# Patient Record
Sex: Male | Born: 1950 | Race: White | Hispanic: No | Marital: Married | Smoking: Former smoker
Health system: Southern US, Community
[De-identification: ages and names within clinical notes are randomized; demographics above are authoritative.]

## PROBLEM LIST (undated history)

## (undated) DIAGNOSIS — G4733 Obstructive sleep apnea (adult) (pediatric): Secondary | ICD-10-CM

## (undated) DIAGNOSIS — I219 Acute myocardial infarction, unspecified: Secondary | ICD-10-CM

## (undated) DIAGNOSIS — Z9989 Dependence on other enabling machines and devices: Secondary | ICD-10-CM

## (undated) DIAGNOSIS — M75101 Unspecified rotator cuff tear or rupture of right shoulder, not specified as traumatic: Secondary | ICD-10-CM

## (undated) DIAGNOSIS — E663 Overweight: Secondary | ICD-10-CM

## (undated) DIAGNOSIS — J42 Unspecified chronic bronchitis: Secondary | ICD-10-CM

## (undated) DIAGNOSIS — E785 Hyperlipidemia, unspecified: Secondary | ICD-10-CM

## (undated) DIAGNOSIS — M1990 Unspecified osteoarthritis, unspecified site: Secondary | ICD-10-CM

## (undated) DIAGNOSIS — M797 Fibromyalgia: Secondary | ICD-10-CM

## (undated) DIAGNOSIS — R9430 Abnormal result of cardiovascular function study, unspecified: Secondary | ICD-10-CM

## (undated) DIAGNOSIS — Z6833 Body mass index (BMI) 33.0-33.9, adult: Secondary | ICD-10-CM

## (undated) DIAGNOSIS — E039 Hypothyroidism, unspecified: Secondary | ICD-10-CM

## (undated) DIAGNOSIS — I2510 Atherosclerotic heart disease of native coronary artery without angina pectoris: Secondary | ICD-10-CM

## (undated) DIAGNOSIS — R7989 Other specified abnormal findings of blood chemistry: Secondary | ICD-10-CM

## (undated) DIAGNOSIS — J309 Allergic rhinitis, unspecified: Secondary | ICD-10-CM

## (undated) DIAGNOSIS — I10 Essential (primary) hypertension: Secondary | ICD-10-CM

## (undated) DIAGNOSIS — J45909 Unspecified asthma, uncomplicated: Secondary | ICD-10-CM

## (undated) DIAGNOSIS — K219 Gastro-esophageal reflux disease without esophagitis: Secondary | ICD-10-CM

## (undated) DIAGNOSIS — I639 Cerebral infarction, unspecified: Secondary | ICD-10-CM

## (undated) DIAGNOSIS — M109 Gout, unspecified: Secondary | ICD-10-CM

## (undated) DIAGNOSIS — E119 Type 2 diabetes mellitus without complications: Secondary | ICD-10-CM

## (undated) DIAGNOSIS — J449 Chronic obstructive pulmonary disease, unspecified: Secondary | ICD-10-CM

## (undated) HISTORY — DX: Atherosclerotic heart disease of native coronary artery without angina pectoris: I25.10

## (undated) HISTORY — PX: CORONARY ANGIOPLASTY WITH STENT PLACEMENT: SHX49

## (undated) HISTORY — PX: CARDIAC CATHETERIZATION: SHX172

## (undated) HISTORY — DX: Allergic rhinitis, unspecified: J30.9

## (undated) HISTORY — DX: Essential (primary) hypertension: I10

## (undated) HISTORY — DX: Hyperlipidemia, unspecified: E78.5

## (undated) HISTORY — PX: SHOULDER OPEN ROTATOR CUFF REPAIR: SHX2407

## (undated) HISTORY — DX: Cerebral infarction, unspecified: I63.9

## (undated) HISTORY — DX: Unspecified osteoarthritis, unspecified site: M19.90

## (undated) HISTORY — DX: Body mass index (BMI) 33.0-33.9, adult: Z68.33

## (undated) HISTORY — DX: Fibromyalgia: M79.7

## (undated) HISTORY — DX: Overweight: E66.3

## (undated) HISTORY — DX: Other specified abnormal findings of blood chemistry: R79.89

## (undated) HISTORY — DX: Hypothyroidism, unspecified: E03.9

## (undated) HISTORY — DX: Unspecified rotator cuff tear or rupture of right shoulder, not specified as traumatic: M75.101

## (undated) HISTORY — PX: OTHER SURGICAL HISTORY: SHX169

## (undated) HISTORY — DX: Abnormal result of cardiovascular function study, unspecified: R94.30

---

## 2004-02-27 DIAGNOSIS — I219 Acute myocardial infarction, unspecified: Secondary | ICD-10-CM

## 2004-02-27 HISTORY — DX: Acute myocardial infarction, unspecified: I21.9

## 2004-05-22 ENCOUNTER — Ambulatory Visit: Payer: Self-pay | Admitting: Pulmonary Disease

## 2004-08-23 ENCOUNTER — Ambulatory Visit: Payer: Self-pay | Admitting: Cardiology

## 2004-08-23 ENCOUNTER — Inpatient Hospital Stay (HOSPITAL_COMMUNITY): Admission: EM | Admit: 2004-08-23 | Discharge: 2004-08-26 | Payer: Self-pay | Admitting: Emergency Medicine

## 2004-08-25 ENCOUNTER — Ambulatory Visit: Payer: Self-pay | Admitting: Pulmonary Disease

## 2004-09-06 ENCOUNTER — Ambulatory Visit: Payer: Self-pay | Admitting: Pulmonary Disease

## 2004-09-25 ENCOUNTER — Ambulatory Visit: Payer: Self-pay | Admitting: Cardiology

## 2004-09-27 ENCOUNTER — Ambulatory Visit: Payer: Self-pay | Admitting: Cardiology

## 2004-09-27 ENCOUNTER — Ambulatory Visit (HOSPITAL_COMMUNITY): Admission: RE | Admit: 2004-09-27 | Discharge: 2004-09-28 | Payer: Self-pay | Admitting: Cardiology

## 2004-10-05 ENCOUNTER — Encounter (HOSPITAL_COMMUNITY): Admission: RE | Admit: 2004-10-05 | Discharge: 2005-01-03 | Payer: Self-pay | Admitting: Cardiology

## 2004-10-17 ENCOUNTER — Ambulatory Visit: Payer: Self-pay | Admitting: Cardiology

## 2004-10-26 ENCOUNTER — Ambulatory Visit: Payer: Self-pay | Admitting: Cardiology

## 2004-12-01 ENCOUNTER — Ambulatory Visit: Payer: Self-pay | Admitting: Pulmonary Disease

## 2004-12-14 ENCOUNTER — Ambulatory Visit: Payer: Self-pay | Admitting: Cardiology

## 2005-03-22 ENCOUNTER — Ambulatory Visit: Payer: Self-pay | Admitting: Cardiology

## 2005-04-12 ENCOUNTER — Ambulatory Visit: Payer: Self-pay | Admitting: Cardiology

## 2005-05-07 ENCOUNTER — Ambulatory Visit: Payer: Self-pay

## 2005-05-10 ENCOUNTER — Ambulatory Visit: Payer: Self-pay | Admitting: Cardiology

## 2005-05-16 ENCOUNTER — Ambulatory Visit: Payer: Self-pay | Admitting: Pulmonary Disease

## 2005-07-09 ENCOUNTER — Ambulatory Visit: Payer: Self-pay | Admitting: Pulmonary Disease

## 2005-07-12 ENCOUNTER — Ambulatory Visit: Payer: Self-pay | Admitting: Cardiology

## 2005-08-20 ENCOUNTER — Ambulatory Visit: Payer: Self-pay | Admitting: Pulmonary Disease

## 2005-09-03 ENCOUNTER — Ambulatory Visit: Payer: Self-pay | Admitting: Cardiology

## 2005-09-03 ENCOUNTER — Ambulatory Visit: Payer: Self-pay | Admitting: Internal Medicine

## 2005-09-06 ENCOUNTER — Ambulatory Visit: Payer: Self-pay | Admitting: Cardiology

## 2005-09-11 ENCOUNTER — Ambulatory Visit: Payer: Self-pay | Admitting: Cardiology

## 2005-09-11 ENCOUNTER — Inpatient Hospital Stay (HOSPITAL_BASED_OUTPATIENT_CLINIC_OR_DEPARTMENT_OTHER): Admission: RE | Admit: 2005-09-11 | Discharge: 2005-09-11 | Payer: Self-pay | Admitting: Cardiology

## 2005-09-18 ENCOUNTER — Ambulatory Visit: Payer: Self-pay | Admitting: Pulmonary Disease

## 2005-10-01 ENCOUNTER — Ambulatory Visit: Payer: Self-pay | Admitting: Internal Medicine

## 2006-03-19 ENCOUNTER — Ambulatory Visit: Payer: Self-pay | Admitting: Pulmonary Disease

## 2006-03-19 LAB — CONVERTED CEMR LAB
Albumin: 4.4 g/dL (ref 3.5–5.2)
Alkaline Phosphatase: 22 units/L — ABNORMAL LOW (ref 39–117)
BUN: 18 mg/dL (ref 6–23)
Basophils Absolute: 0 10*3/uL (ref 0.0–0.1)
Basophils Relative: 0.6 % (ref 0.0–1.0)
CO2: 31 meq/L (ref 19–32)
Creatinine, Ser: 1.2 mg/dL (ref 0.4–1.5)
Eosinophils Relative: 6 % — ABNORMAL HIGH (ref 0.0–5.0)
GFR calc non Af Amer: 67 mL/min
Glucose, Bld: 109 mg/dL — ABNORMAL HIGH (ref 70–99)
Hemoglobin: 14.6 g/dL (ref 13.0–17.0)
MCV: 88.6 fL (ref 78.0–100.0)
Monocytes Relative: 9.8 % (ref 3.0–11.0)
Neutro Abs: 3.2 10*3/uL (ref 1.4–7.7)
Potassium: 4.7 meq/L (ref 3.5–5.1)
RBC: 4.73 M/uL (ref 4.22–5.81)
Sodium: 141 meq/L (ref 135–145)
Total CHOL/HDL Ratio: 4.6

## 2006-04-18 ENCOUNTER — Ambulatory Visit: Payer: Self-pay | Admitting: Cardiology

## 2006-07-24 ENCOUNTER — Ambulatory Visit: Payer: Self-pay | Admitting: Pulmonary Disease

## 2006-08-15 ENCOUNTER — Ambulatory Visit: Payer: Self-pay | Admitting: Cardiology

## 2006-09-18 ENCOUNTER — Other Ambulatory Visit: Payer: Self-pay | Admitting: Orthopedic Surgery

## 2006-09-19 ENCOUNTER — Observation Stay (HOSPITAL_COMMUNITY): Admission: RE | Admit: 2006-09-19 | Discharge: 2006-09-20 | Payer: Self-pay | Admitting: Orthopedic Surgery

## 2007-03-28 DIAGNOSIS — E785 Hyperlipidemia, unspecified: Secondary | ICD-10-CM

## 2007-03-28 DIAGNOSIS — J209 Acute bronchitis, unspecified: Secondary | ICD-10-CM | POA: Insufficient documentation

## 2007-03-28 DIAGNOSIS — I10 Essential (primary) hypertension: Secondary | ICD-10-CM | POA: Insufficient documentation

## 2007-03-28 DIAGNOSIS — E039 Hypothyroidism, unspecified: Secondary | ICD-10-CM | POA: Insufficient documentation

## 2007-04-15 ENCOUNTER — Ambulatory Visit: Payer: Self-pay | Admitting: Cardiology

## 2007-04-15 ENCOUNTER — Ambulatory Visit: Payer: Self-pay | Admitting: Pulmonary Disease

## 2007-04-15 ENCOUNTER — Ambulatory Visit (HOSPITAL_BASED_OUTPATIENT_CLINIC_OR_DEPARTMENT_OTHER): Admission: RE | Admit: 2007-04-15 | Discharge: 2007-04-15 | Payer: Self-pay | Admitting: Pulmonary Disease

## 2007-04-15 DIAGNOSIS — G4733 Obstructive sleep apnea (adult) (pediatric): Secondary | ICD-10-CM

## 2007-04-21 ENCOUNTER — Telehealth: Payer: Self-pay | Admitting: Pulmonary Disease

## 2007-04-24 ENCOUNTER — Ambulatory Visit: Payer: Self-pay | Admitting: Pulmonary Disease

## 2007-05-21 ENCOUNTER — Encounter: Payer: Self-pay | Admitting: Pulmonary Disease

## 2007-05-22 ENCOUNTER — Ambulatory Visit: Payer: Self-pay | Admitting: Pulmonary Disease

## 2007-05-26 ENCOUNTER — Encounter: Payer: Self-pay | Admitting: Pulmonary Disease

## 2007-06-02 ENCOUNTER — Encounter: Payer: Self-pay | Admitting: Pulmonary Disease

## 2007-06-26 ENCOUNTER — Ambulatory Visit: Payer: Self-pay | Admitting: Pulmonary Disease

## 2007-06-26 DIAGNOSIS — J309 Allergic rhinitis, unspecified: Secondary | ICD-10-CM | POA: Insufficient documentation

## 2007-08-01 ENCOUNTER — Telehealth: Payer: Self-pay | Admitting: Pulmonary Disease

## 2007-08-06 ENCOUNTER — Encounter: Payer: Self-pay | Admitting: Pulmonary Disease

## 2007-10-08 ENCOUNTER — Ambulatory Visit: Payer: Self-pay | Admitting: Cardiology

## 2007-12-22 ENCOUNTER — Ambulatory Visit: Payer: Self-pay | Admitting: Pulmonary Disease

## 2008-03-05 ENCOUNTER — Encounter: Payer: Self-pay | Admitting: Adult Health

## 2008-03-05 ENCOUNTER — Ambulatory Visit: Payer: Self-pay | Admitting: Internal Medicine

## 2008-03-09 ENCOUNTER — Telehealth (INDEPENDENT_AMBULATORY_CARE_PROVIDER_SITE_OTHER): Payer: Self-pay | Admitting: *Deleted

## 2008-03-16 ENCOUNTER — Ambulatory Visit: Payer: Self-pay | Admitting: Cardiology

## 2008-03-24 ENCOUNTER — Encounter: Payer: Self-pay | Admitting: Pulmonary Disease

## 2008-03-24 ENCOUNTER — Ambulatory Visit: Payer: Self-pay

## 2008-04-01 ENCOUNTER — Ambulatory Visit: Payer: Self-pay | Admitting: Cardiology

## 2008-05-24 DIAGNOSIS — E663 Overweight: Secondary | ICD-10-CM | POA: Insufficient documentation

## 2008-07-23 ENCOUNTER — Telehealth (INDEPENDENT_AMBULATORY_CARE_PROVIDER_SITE_OTHER): Payer: Self-pay | Admitting: *Deleted

## 2008-08-03 ENCOUNTER — Ambulatory Visit: Payer: Self-pay | Admitting: Pulmonary Disease

## 2008-08-03 DIAGNOSIS — M1990 Unspecified osteoarthritis, unspecified site: Secondary | ICD-10-CM | POA: Insufficient documentation

## 2008-08-06 ENCOUNTER — Ambulatory Visit: Payer: Self-pay | Admitting: Pulmonary Disease

## 2008-08-08 LAB — CONVERTED CEMR LAB
AST: 23 units/L (ref 0–37)
Alkaline Phosphatase: 27 units/L — ABNORMAL LOW (ref 39–117)
BUN: 21 mg/dL (ref 6–23)
Bilirubin Urine: NEGATIVE
CO2: 29 meq/L (ref 19–32)
Calcium: 9 mg/dL (ref 8.4–10.5)
Chloride: 104 meq/L (ref 96–112)
Cholesterol: 241 mg/dL — ABNORMAL HIGH (ref 0–200)
Direct LDL: 178.6 mg/dL
Eosinophils Relative: 5.5 % — ABNORMAL HIGH (ref 0.0–5.0)
HCT: 41.4 % (ref 39.0–52.0)
Hemoglobin, Urine: NEGATIVE
Hemoglobin: 14.3 g/dL (ref 13.0–17.0)
Ketones, ur: NEGATIVE mg/dL
MCV: 89.9 fL (ref 78.0–100.0)
Monocytes Absolute: 0.6 10*3/uL (ref 0.1–1.0)
Monocytes Relative: 9.1 % (ref 3.0–12.0)
RBC: 4.61 M/uL (ref 4.22–5.81)
RDW: 12.8 % (ref 11.5–14.6)
Specific Gravity, Urine: 1.02 (ref 1.000–1.030)
Total Bilirubin: 0.8 mg/dL (ref 0.3–1.2)
Total Protein: 7.1 g/dL (ref 6.0–8.3)
Triglycerides: 143 mg/dL (ref 0.0–149.0)
Urine Glucose: NEGATIVE mg/dL
Urobilinogen, UA: 0.2 (ref 0.0–1.0)
WBC: 6.8 10*3/uL (ref 4.5–10.5)
pH: 6 (ref 5.0–8.0)

## 2008-12-20 ENCOUNTER — Ambulatory Visit: Payer: Self-pay | Admitting: Pulmonary Disease

## 2009-02-24 ENCOUNTER — Encounter (INDEPENDENT_AMBULATORY_CARE_PROVIDER_SITE_OTHER): Payer: Self-pay | Admitting: *Deleted

## 2009-04-03 ENCOUNTER — Encounter: Payer: Self-pay | Admitting: Cardiology

## 2009-04-04 ENCOUNTER — Ambulatory Visit: Payer: Self-pay | Admitting: Cardiology

## 2009-08-03 ENCOUNTER — Ambulatory Visit: Payer: Self-pay | Admitting: Pulmonary Disease

## 2009-08-07 DIAGNOSIS — R7989 Other specified abnormal findings of blood chemistry: Secondary | ICD-10-CM | POA: Insufficient documentation

## 2009-08-09 LAB — CONVERTED CEMR LAB
AST: 21 units/L (ref 0–37)
Alkaline Phosphatase: 33 units/L — ABNORMAL LOW (ref 39–117)
Calcium: 9.3 mg/dL (ref 8.4–10.5)
Eosinophils Relative: 7.2 % — ABNORMAL HIGH (ref 0.0–5.0)
GFR calc non Af Amer: 70.03 mL/min (ref >60)
HDL: 48 mg/dL (ref >39.00)
Hemoglobin: 13.6 g/dL (ref 13.0–17.0)
Lymphocytes Relative: 28.6 % (ref 12.0–46.0)
Monocytes Absolute: 0.8 10*3/uL (ref 0.1–1.0)
Monocytes Relative: 9.6 % (ref 3.0–12.0)
PSA: 0.34 ng/mL (ref 0.10–4.00)
Potassium: 4.7 meq/L (ref 3.5–5.1)
RDW: 13.4 % (ref 11.5–14.6)
Sodium: 141 meq/L (ref 135–145)
TSH: 0.44 microintl units/mL (ref 0.35–5.50)
Total Bilirubin: 0.7 mg/dL (ref 0.3–1.2)
Total Protein: 7.2 g/dL (ref 6.0–8.3)
VLDL: 40.2 mg/dL — ABNORMAL HIGH (ref 0.0–40.0)

## 2009-12-20 ENCOUNTER — Telehealth (INDEPENDENT_AMBULATORY_CARE_PROVIDER_SITE_OTHER): Payer: Self-pay | Admitting: *Deleted

## 2009-12-30 ENCOUNTER — Ambulatory Visit: Payer: Self-pay | Admitting: Pulmonary Disease

## 2010-03-26 LAB — CONVERTED CEMR LAB
Albumin: 4.5 g/dL (ref 3.5–5.2)
Basophils Relative: 1 % (ref 0.0–1.0)
Bilirubin, Direct: 0.1 mg/dL (ref 0.0–0.3)
CO2: 30 meq/L (ref 19–32)
Calcium: 9.4 mg/dL (ref 8.4–10.5)
Chloride: 102 meq/L (ref 96–112)
Cholesterol: 238 mg/dL (ref 0–200)
Eosinophils Absolute: 0.5 10*3/uL (ref 0.0–0.6)
Eosinophils Relative: 5.7 % — ABNORMAL HIGH (ref 0.0–5.0)
Glucose, Bld: 113 mg/dL — ABNORMAL HIGH (ref 70–99)
HDL: 47.9 mg/dL (ref >39.0)
Hemoglobin: 14.3 g/dL (ref 13.0–17.0)
Lymphocytes Relative: 29.8 % (ref 12.0–46.0)
Monocytes Absolute: 0.7 10*3/uL (ref 0.2–0.7)
PSA: 0.51 ng/mL (ref 0.10–4.00)
Potassium: 4.3 meq/L (ref 3.5–5.1)
RBC: 4.87 M/uL (ref 4.22–5.81)
Sodium: 140 meq/L (ref 135–145)
TSH: 0.39 microintl units/mL (ref 0.35–5.50)
Total Bilirubin: 0.9 mg/dL (ref 0.3–1.2)

## 2010-03-29 NOTE — Miscellaneous (Signed)
  Clinical Lists Changes  Problems: Added new problem of HYPERTENSION (ICD-401.9) Observations: Added new observation of PAST MED HX: ALLERGIC RHINITIS (ICD-477.9) OBSTRUCTIVE SLEEP APNEA (ICD-327.23)...C Pap....Dr. Kriste Basque ASTHMATIC BRONCHITIS, ACUTE (ICD-466.0) HYPERTENSION (ICD-401.9) ARTERIOSCLEROTIC HEART DISEASE (ICD-414.00) HYPERLIPIDEMIA (ICD-272.4) HYPOTHYROIDISM (ICD-244.9) DEGENERATIVE JOINT DISEASE (ICD-715.90) SURG/OTH PROC NOT CARRIED OUT BECAUSE PTS DECN (ICD-V64.2) CAD...... DES for ST elevation MI, June, 2006  /   DES  LAD....8/ 2006   /  cath 7/ 2007...stents patent  /   myoview  January, 2010...no ischemia EF  55%...cath...7/  2007   /   myoview..60%..02/2008 Overweight   (04/03/2009 11:28) Added new observation of REFERRING MD: Kriste Basque (04/03/2009 11:28) Added new observation of PRIMARY MD: Alroy Dust (04/03/2009 11:28)       Past History:  Past Medical History: ALLERGIC RHINITIS (ICD-477.9) OBSTRUCTIVE SLEEP APNEA (ICD-327.23)...C Pap....Dr. Kriste Basque ASTHMATIC BRONCHITIS, ACUTE (ICD-466.0) HYPERTENSION (ICD-401.9) ARTERIOSCLEROTIC HEART DISEASE (ICD-414.00) HYPERLIPIDEMIA (ICD-272.4) HYPOTHYROIDISM (ICD-244.9) DEGENERATIVE JOINT DISEASE (ICD-715.90) SURG/OTH PROC NOT CARRIED OUT BECAUSE PTS DECN (ICD-V64.2) CAD...... DES for ST elevation MI, June, 2006  /   DES  LAD....8/ 2006   /  cath 7/ 2007...stents patent  /   myoview  January, 2010...no ischemia EF  55%...cath...7/  2007   /   myoview..60%..02/2008 Overweight

## 2010-03-29 NOTE — Assessment & Plan Note (Signed)
Summary: N8G      Allergies Added:   Visit Type:  Follow-up Referring Provider:  Kriste Basque Primary Provider:  Alroy Dust  CC:  coronary disease.  History of Present Illness: The patient is seen for cardiology followup.  He is doing well.  He had a drug-eluting stent placed in August 2 006.  In July, 2007 catheterization was done and his stents were patent.  Myoview in January 2 010 revealed no ischemia.  He does continue on aspirin and Plavix.  He has had vague chest discomfort rarely.  Current Medications (verified): 1)  Bayer Aspirin 325 Mg  Tabs (Aspirin) .... Once Daily 2)  Plavix 75 Mg  Tabs (Clopidogrel Bisulfate) .Marland Kitchen.. 1 Tab By Mouth Once Daily.Marland KitchenMarland Kitchen 3)  Toprol Xl 50 Mg  Tb24 (Metoprolol Succinate) .... Take 1 Tablet By Mouth Once A Day 4)  Nitroquick 0.4 Mg  Subl (Nitroglycerin) .... Use As Directed... 5)  Synthroid 175 Mcg  Tabs (Levothyroxine Sodium) .Marland Kitchen.. 1 Tab By Mouth Once Daily.Marland KitchenMarland Kitchen 6)  Multivitamins   Tabs (Multiple Vitamin) .... Once Daily 7)  Flexeril 10 Mg  Tabs (Cyclobenzaprine Hcl) .... 1/2 To 1 Tab By Mouth Three Times A Day As Needed For Muscle Spasms...  Allergies (verified): 1)  ! Statin Meds 2)  Ibuprofen  Past History:  Past Medical History: ALLERGIC RHINITIS (ICD-477.9) OBSTRUCTIVE SLEEP APNEA (ICD-327.23)...C Pap....Dr. Kriste Basque ASTHMATIC BRONCHITIS, ACUTE (ICD-466.0) HYPERTENSION (ICD-401.9) ARTERIOSCLEROTIC HEART DISEASE (ICD-414.00) HYPERLIPIDEMIA (ICD-272.4) HYPOTHYROIDISM (ICD-244.9) DEGENERATIVE JOINT DISEASE (ICD-715.90) SURG/OTH PROC NOT CARRIED OUT BECAUSE PTS DECN (ICD-V64.2) CAD...... DES for ST elevation MI, June, 2006  /   DES  LAD....8/ 2006   /  cath 7/ 2007...stents patent  /   myoview  January, 2010...no ischemia EF  55%...cath...7/  2007   /   myoview..60%..02/2008 Overweight Statin intolerance  Review of Systems       Patient denies fever, chills, headache, sweats, rash, change in vision, change in hearing, chest pain, cough, shortness  of breath, nausea or vomiting, urinary symptoms.  All of the systems are reviewed and are negative.  Vital Signs:  Patient profile:   60 year old male Height:      73 inches Weight:      262 pounds BMI:     34.69 Pulse rate:   68 / minute BP sitting:   152 / 76  (left arm) Cuff size:   regular  Vitals Entered By: Hardin Negus, RMA (April 04, 2009 11:06 AM)  Physical Exam  General:  patient is stable.  He is overweight. Head:  head is normocephalic. Eyes:  no xanthelasma. Neck:  no jugular venous distention. Chest Wall:  no chest wall tenderness. Lungs:  lungs are clear.  Respiratory effort is nonlabored. Heart:  cardiac exam reveals S1-S2.  No clicks or significant murmurs. Abdomen:  abdomen is soft. Msk:  no musculoskeletal deformities. Extremities:  no peripheral edema. Skin:  no skin rashes. Psych:  patient is oriented to person time and place.  Affect is normal.   Impression & Recommendations:  Problem # 1:  HYPERTENSION (ICD-401.9)  His updated medication list for this problem includes:    Bayer Aspirin 325 Mg Tabs (Aspirin) ..... Once daily    Toprol Xl 50 Mg Tb24 (Metoprolol succinate) .Marland Kitchen... Take 1 tablet by mouth once a day Systolic blood pressure is mildly elevated today.  He will be followup blood pressure checks but I chose not to change his medicine today.  Problem # 2:  CAD, UNSPECIFIED SITE (ICD-414.00)  His updated medication list for this problem includes:    Bayer Aspirin 325 Mg Tabs (Aspirin) ..... Once daily    Plavix 75 Mg Tabs (Clopidogrel bisulfate) .Marland Kitchen... 1 tab by mouth once daily...    Toprol Xl 50 Mg Tb24 (Metoprolol succinate) .Marland Kitchen... Take 1 tablet by mouth once a day    Nitroquick 0.4 Mg Subl (Nitroglycerin) ..... Use as directed... Coronary disease is stable.  EKG is done today and reviewed by me.  It is normal.  Because the patient has had some vague chest discomfort I've chosen to keep him on aspirin and Plavix for another year.  At that  time we will reconsider Plavix he does not need any other testing at this time.  Problem # 3:  HYPERLIPIDEMIA (ICD-272.4) The patient is intolerant to statins.  He prefers not to take cholesterol medications.  Problem # 4:  HYPOTHYROIDISM (ICD-244.9)  His updated medication list for this problem includes:    Synthroid 175 Mcg Tabs (Levothyroxine sodium) .Marland Kitchen... 1 tab by mouth once daily... The patient is on his thyroid medication.  No further workup is needed now.  Patient Instructions: 1)  Your physician has requested that you regularly monitor and record your blood pressure readings at home.  Please use the same machine at the same time of day to check your readings and record them to bring to your follow-up visit with Dr Kriste Basque. 2)  Follow up in 1 year

## 2010-03-29 NOTE — Assessment & Plan Note (Signed)
Summary: rov/jd   Primary Care Provider:  Alroy Dust  CC:  Yearly ROV & CPX... and Depression.  History of Present Illness: 60 y/o WM here for a follow up visit... he has multiple medical problems as noted below...   ~  Feb09:  he had some left shoulder pain and saw DrSypher w/ arthroscopy and rotator cuff repair... he is doing much better now... he feels well and denies any new complaints or concerns...   ~  August 03, 2008:  here for yearly follow up and has mult questions:  otherwise doing satis on CPAP nightly, BP controlled, no CP & neg Myoview 1/10, still refuses Chol meds...      ** rash on right leg/ truck/ back- c/w chigger bites/ recent tick exposure... no mites seen, no systemic reaction, local reaction only w/ itching- Rx ice, Benedryl, topical (lotrisone)...      ** bilat foot pain- first thing in AM or after rest, better after up & about... c/w plantar fasciitis- Rx soaks, stretching, antiinflamm Rx, consider podiatrist.   ~  August 03, 2009:  he states he had attack of gout & took his father's med w/ resolution> we checked Uric= 8.0 and rec starting ALLOPURINOL 300mg /d for prevention...  he has OSA on CPAP per Drclance w/ f/u 10/10- stable on Rx... also saw DrKatz for Cards 2/11- doing satis & EKG= WNL, he reamins on ASA, Plavix, Toprol... he has proved intol at all lipid lowering meds and does the best he can on diet alone... he has repeatedly refused routine screening colonoscopy & vaccinations...    Current Problems:   OBSTRUCTIVE SLEEP APNEA (ICD-327.23) - prev ROS was pos for snoring and symptoms suggesting signif OSA... he denied daytime hypersomnolence, but he noted decreased O2 sats at night in the hosp post op!... Sleep Study 2/09 showed AHI= 15, RDI= 16, Desat to 77%, +snoring... started on CPAP 12 by DrClance & using it nightly w/ good response, tolerating well, wife pleased...  ~  f/u DrClance 10/10- stable on CPAP, continue same.  ASTHMATIC BRONCHITIS, ACUTE  (ICD-466.0) - ex-smoker quit in 2006 w/ his MI... no recent exac and he denies cough, sputum, hemoptysis, worsening dyspnea,  wheezing, chest pains, etc... he is on no regular medication, but uses MUCINEX as needed...  ~  CXR 6/11 showed clear lungs, WNL...   HYPERTENSION (ICD-401.9) - controlled on TOPROL XL 50mg /d... not following any sort of diet and weight is up to 258#... not restricting sodium... BP=112/70 today and pt denies HA, fatigue, visual changes, CP, palipit, dizziness, syncope, dyspnea, edema, etc... we discussed diet rx and salt restriction.  ARTERIOSCLEROTIC HEART DISEASE (ICD-414.00) - on ASA 325mg /d & PLAVIX 75mg /d... followed DrKatz and his notes are reviewed- doing well, f/u 14yr... he quit smoking 2006, unfortunately he refuses therapy for his hypercholesterolemia...   ~  he had IWMI 6/06 w/ RCA PTCA & stent...  ~  last cath= 7/07 w/ non-obstructive dis in 3 vessels...  ~  Myoview 1/10 showed diaph attenuation without ischemia or infarct, EF= 64%...  HYPERLIPIDEMIA (ICD-272.4) - pt states intolerant to all statins and lipid clinic tried Zetia and Welcol- all of which he couldn't tolerate... on diet alone & not doing a very good job!  He refuses to reconsider statin meds.  ~  FLP 1/08 showed TChol 225, TG 164, HDL 49, LDL 154  ~  FLP 2/09 showed TChol 238, TG 149, HDL 48, LDL 167  ~  FLP 6/10 showed TChol 241, TGT  143, HDL 52, LDL 179  ~  FLP 6/11 showed TChol 217, TG 201, HDL 48, LDL 151  HYPOTHYROIDISM (ICD-244.9) - stable on SYNTHROID 196mcg/d...  ~  labs 1/08 showed TSH= 0.41  ~  labs 2/09 showed TSH= 0.39  ~  labs 6/10 showed TSH= 1.31  ~  labs 6/11 showed TSH= 0.44  DEGENERATIVE JOINT DISEASE (ICD-715.90) - he had right shoulder surg by DrSypher 2008.  HYPERURICEMIA (ICD-790.6) - he had ?gout attack by his hx & took his father's gout Rx (?what) w/ improvement...  ~  labs 6/11 showed Uric = 8.0.Marland KitchenMarland Kitchen rec to start ALLOPURINOL 300mg /d...  SURG/OTH PROC NOT CARRIED OUT  BECAUSE PTS DECN (ICD-V64.2) - pt has repeatedly refused colonoscopy and we discussed this today- he refuses again and doesn't want appt to talk to Gi or consider alternatives to screening eg. virtual colonoscopy etc...   Preventive Screening-Counseling & Management  Alcohol-Tobacco     Smoking Status: quit     Year Quit: 2006  Allergies: 1)  ! Statin Meds 2)  Ibuprofen  Comments:  Nurse/Medical Assistant: The patient's medications and allergies were reviewed with the patient and were updated in the Medication and Allergy Lists.  Past History:  Past Medical History: HYPERTENSION (ICD-401.9) CAD, UNSPECIFIED SITE (ICD-414.00) OVERWEIGHT/OBESITY (ICD-278.02) ALLERGIC RHINITIS (ICD-477.9) OBSTRUCTIVE SLEEP APNEA (ICD-327.23) ASTHMATIC BRONCHITIS, ACUTE (ICD-466.0) HYPERTENSION (ICD-401.9) ARTERIOSCLEROTIC HEART DISEASE (ICD-414.00) HYPERLIPIDEMIA (ICD-272.4) HYPOTHYROIDISM (ICD-244.9) DEGENERATIVE JOINT DISEASE (ICD-715.90) HYPERURICEMIA (ICD-790.6) SURG/OTH PROC NOT CARRIED OUT BECAUSE PTS DECN (ICD-V64.2)  CAD...... DES for ST elevation MI, June, 2006  /   DES  LAD....8/ 2006   /  cath 7/ 2007...stents patent  /   myoview  January, 2010...no ischemia EF  55%...cath...7/  2007   /   myoview..60%..02/2008 Overweight Statin intolerance  Past Surgical History: S/P left shoulder surg 7/08 by DrSypher Taxus drug-eluting stent on September 27, 2004 by Salvadore Farber, M.D  Family History: Reviewed history from 05/22/2007 and no changes required. heart disease: brother  Social History: Reviewed history from 05/24/2008 and no changes required. Patient states former smoker.  pt is married. pt has children. Tobacco Use - Yes.  Alcohol Use - yes -- rarely Smoking Status:  quit  Review of Systems      See HPI  The patient denies fever, chills, sweats, anorexia, fatigue, weakness, malaise, weight loss, sleep disorder, blurring, diplopia, eye irritation, eye discharge, vision  loss, eye pain, photophobia, earache, ear discharge, tinnitus, decreased hearing, nasal congestion, nosebleeds, sore throat, hoarseness, chest pain, palpitations, syncope, orthopnea, PND, peripheral edema, cough, dyspnea at rest, excessive sputum, hemoptysis, wheezing, pleurisy, nausea, vomiting, diarrhea, constipation, change in bowel habits, abdominal pain, melena, hematochezia, jaundice, gas/bloating, indigestion/heartburn, dysphagia, odynophagia, dysuria, hematuria, urinary frequency, urinary hesitancy, nocturia, incontinence, back pain, joint pain, joint swelling, muscle cramps, muscle weakness, stiffness, arthritis, sciatica, restless legs, leg pain at night, leg pain with exertion, rash, itching, dryness, suspicious lesions, paralysis, paresthesias, seizures, tremors, vertigo, transient blindness, frequent falls, frequent headaches, difficulty walking, depression, anxiety, memory loss, confusion, cold intolerance, heat intolerance, polydipsia, polyphagia, polyuria, unusual weight change, abnormal bruising, bleeding, enlarged lymph nodes, urticaria, allergic rash, hay fever, and recurrent infections.    Vital Signs:  Patient profile:   60 year old male Height:      73 inches Weight:      258 pounds BMI:     34.16 O2 Sat:      93 % on Room air Temp:     97.1 degrees F oral Pulse rate:  66 / minute BP sitting:   112 / 70  (left arm) Cuff size:   large  Vitals Entered By: Randell Loop CMA (August 03, 2009 10:08 AM)  O2 Sat at Rest %:  93 O2 Flow:  Room air CC: Yearly ROV & CPX..., Depression Is Patient Diabetic? No Pain Assessment Patient in pain? no      Comments no changes in meds today   Physical Exam  Additional Exam:  WD, WN, 60 y/o WM in NAD GENERAL:  Alert & oriented; pleasant & cooperative... HEENT:  [place]/AT, EOM-wnl, PERRLA, EACs-clear, TMs-wnl, NOSE-clear, THROAT-clear & wnl. NECK:  Supple w/ fairROM; no JVD; normal carotid impulses w/o bruits; no thyromegaly or nodules  palpated; no lymphadenopathy. CHEST:  Clear to P & A; without wheezes/ rales/ or rhonchi. HEART:  Regular Rhythm; without murmurs/ rubs/ or gallops heard... ABDOMEN:  Soft & nontender; normal bowel sounds; no organomegaly or masses detected... EXT: without deformities, mild arthritic changes; no varicose veins/ venous insuffic/ or edema. NEURO:  CN's intact; motor testing normal; sensory testing normal; gait normal & balance OK. DERM:  no lesions seen...    CXR  Procedure date:  08/03/2009  Findings:      CHEST - 2 VIEW Comparison: None.   Findings: Heart size is within normal limits.  There is no heart failure.  The lungs are clear without infiltrate or effusion. There is no mass lesion.   IMPRESSION: No acute radiographic abnormality.   Read By:  Camelia Phenes,  M.D.   MISC. Report  Procedure date:  08/03/2009  Findings:      BMP (METABOL)   Sodium                    141 mEq/L                   135-145   Potassium                 4.7 mEq/L                   3.5-5.1   Chloride                  103 mEq/L                   96-112   Carbon Dioxide            30 mEq/L                    19-32   Glucose              [H]  116 mg/dL                   16-10   BUN                       23 mg/dL                    9-60   Creatinine                1.1 mg/dL                   4.5-4.0   Calcium                   9.3 mg/dL  8.4-10.5   GFR                       70.03 mL/min                >60  Hepatic/Liver Function Panel (HEPATIC)   Total Bilirubin           0.7 mg/dL                   8.4-6.9   Direct Bilirubin          0.1 mg/dL                   6.2-9.5   Alkaline Phosphatase [L]  33 U/L                      39-117   AST                       21 U/L                      0-37   ALT                       18 U/L                      0-53   Total Protein             7.2 g/dL                    2.8-4.1   Albumin                   4.4 g/dL                     3.2-4.4  CBC Platelet w/Diff (CBCD)   White Cell Count          8.2 K/uL                    4.5-10.5   Red Cell Count            4.47 Mil/uL                 4.22-5.81   Hemoglobin                13.6 g/dL                   01.0-27.2   Hematocrit                39.6 %                      39.0-52.0   MCV                       88.7 fl                     78.0-100.0   Platelet Count            305.0 K/uL                  150.0-400.0   Neutrophil %              54.1 %  43.0-77.0   Lymphocyte %              28.6 %                      12.0-46.0   Monocyte %                9.6 %                       3.0-12.0   Eosinophils%         [H]  7.2 %                       0.0-5.0   Basophils %               0.5 %                       0.0-3.0  Comments:      Lipid Panel (LIPID)   Cholesterol          [H]  217 mg/dL                   6-962   Triglycerides        [H]  201.0 mg/dL                 9.5-284.1   HDL                       32.44 mg/dL                 >01.02 Cholesterol LDL - Direct                             150.7 mg/dL   TSH (TSH)   FastTSH                   0.44 uIU/mL                 0.35-5.50   Uric Acid (URIC)   Uric Acid            [H]  8.0 mg/dL                   7.2-5.3  Prostate Specific Antigen (PSA)   PSA-Hyb                   0.34 ng/mL                  0.10-4.00   Impression & Recommendations:  Problem # 1:  PHYSICAL EXAMINATION (ICD-V70.0)  Orders: T-2 View CXR (71020TC) TLB-BMP (Basic Metabolic Panel-BMET) (80048-METABOL) TLB-Hepatic/Liver Function Pnl (80076-HEPATIC) TLB-CBC Platelet - w/Differential (85025-CBCD) TLB-Lipid Panel (80061-LIPID) TLB-TSH (Thyroid Stimulating Hormone) (84443-TSH) TLB-Uric Acid, Blood (84550-URIC) TLB-PSA (Prostate Specific Antigen) (84153-PSA)  Problem # 2:  HYPERURICEMIA (ICD-790.6) He describes clinical gout> Rx'd w/ father's med (?what this was?).Marland Kitchen. labs show Uric= 8.0 & rec to start ALLOPURINOL  300mg /d...  Problem # 3:  HYPERTENSION (ICD-401.9) Controlled> same med. His updated medication list for this problem includes:    Toprol Xl 50 Mg Tb24 (Metoprolol succinate) .Marland Kitchen... Take 1 tablet by mouth once a day  Problem # 4:  ARTERIOSCLEROTIC HEART DISEASE (ICD-414.00) Followed by Delton See for Cards and stable w/o angina... His updated medication list for this problem includes:    [employer]  Aspirin 325 Mg Tabs (Aspirin) ..... Once daily    Plavix 75 Mg Tabs (Clopidogrel bisulfate) .Marland Kitchen... 1 tab by mouth once daily...    Toprol Xl 50 Mg Tb24 (Metoprolol succinate) .Marland Kitchen... Take 1 tablet by mouth once a day    Nitroquick 0.4 Mg Subl (Nitroglycerin) ..... Use as directed...  Problem # 5:  OBSTRUCTIVE SLEEP APNEA (ICD-327.23) Stable on CPAP...  Problem # 6:  HYPERLIPIDEMIA (ICD-272.4) He refuses to reconsider statin Rx or the lipid clinic... he is content to continue "diet alone" & we reviewed low chol/ low fat diet recs...  Problem # 7:  HYPOTHYROIDISM (ICD-244.9) Continue Synthroid Rx... His updated medication list for this problem includes:    Synthroid 175 Mcg Tabs (Levothyroxine sodium) .Marland Kitchen... 1 tab by mouth once daily...  Problem # 8:  DEGENERATIVE JOINT DISEASE (ICD-715.90) Stable w/ OTC anti-inflamm Rx as needed. His updated medication list for this problem includes:    Bayer Aspirin 325 Mg Tabs (Aspirin) ..... Once daily  Problem # 9:  SURG/OTH PROC NOT CARRIED OUT BECAUSE PTS DECN (ICD-V64.2) we reviewed indications for screening Colonoscopy & for Vaccinations per AMA protocol... he continues to refuse all these.  Complete Medication List: 1)  Bayer Aspirin 325 Mg Tabs (Aspirin) .... Once daily 2)  Plavix 75 Mg Tabs (Clopidogrel bisulfate) .Marland Kitchen.. 1 tab by mouth once daily.Marland KitchenMarland Kitchen 3)  Toprol Xl 50 Mg Tb24 (Metoprolol succinate) .... Take 1 tablet by mouth once a day 4)  Nitroquick 0.4 Mg Subl (Nitroglycerin) .... Use as directed... 5)  Synthroid 175 Mcg Tabs (Levothyroxine sodium) .Marland Kitchen.. 1  tab by mouth once daily.Marland KitchenMarland Kitchen 6)  Multivitamins Tabs (Multiple vitamin) .... Once daily 7)  Flexeril 10 Mg Tabs (Cyclobenzaprine hcl) .... 1/2 to 1 tab by mouth three times a day as needed for muscle spasms...   Patient Instructions: 1)  Today we updated your med list- see below.... 2)  we refilled your meds for 2011... 3)  Today we dis your follow up CXR & FASTING blood work... please call the "phone tree" in a few days for your lab results.Marland KitchenMarland Kitchen 4)  Call for any problems.Marland KitchenMarland Kitchen 5)  Jebediah, you need to have a colonoscopy to screen for colon polyps or cancer- please reconsider this important screening test... 6)  Please schedule a follow-up appointment in 1 year, sooner as needed... Prescriptions: FLEXERIL 10 MG  TABS (CYCLOBENZAPRINE HCL) 1/2 to 1 tab by mouth three times a day as needed for muscle spasms...  #90 x 11   Entered and Authorized by:   Michele Mcalpine MD   Signed by:   Michele Mcalpine MD on 08/03/2009   Method used:   Print then Give to Patient   RxID:   [id number] SYNTHROID 175 MCG  TABS (LEVOTHYROXINE SODIUM) 1 tab by mouth once daily...  #30 x 11   Entered and Authorized by:   Michele Mcalpine MD   Signed by:   Michele Mcalpine MD on 08/03/2009   Method used:   Print then Give to Patient   RxID:   [id number] TOPROL XL 50 MG  TB24 (METOPROLOL SUCCINATE) Take 1 tablet by mouth once a day  #30 x 11   Entered and Authorized by:   Michele Mcalpine MD   Signed by:   Michele Mcalpine MD on 08/03/2009   Method used:   Print then Give to Patient   RxID:   [id number] PLAVIX 75 MG  TABS (CLOPIDOGREL BISULFATE) 1 tab by mouth once daily...  #  30 x 11   Entered and Authorized by:   Michele Mcalpine MD   Signed by:   Michele Mcalpine MD on 08/03/2009   Method used:   Print then Give to Patient   RxID:   [id number]

## 2010-03-29 NOTE — Progress Notes (Signed)
Summary: hand pain and swelling > sterapred dosepak  Phone Note Call from Patient Call back at Home Phone [phone]   Caller: Spouse//cynthia Call For: nadel Summary of Call: States pt c/o right hand pain x 1week and has become severe within the last 2 days, slight swelling in hand, warm to touch, suspects gout, wants to be seen today, pls advise.//[facility] Initial call taken by: Darletta Moll,  December 20, 2009 10:33 AM  Follow-up for Phone Call        Spoke with pt's spouse- pt c/o rt hand pain x 1 wk, worse x 2 days with swelling and feels warm to touch.  Taking aleve without any relief.  She states that he thinks he is having a gout flare.  Takes allopurinol 300 mg daily.  Pls advise thanks Follow-up by: Vernie Murders,  December 20, 2009 10:42 AM  Additional Follow-up for Phone Call Additional follow up Details #1::        per SN---no openings----recs to use hot soaks and call in   sterapred dosepak  #1   10mg ---6 day pack  take as directed. thanks Randell Loop CMA  December 20, 2009 12:15 PM   called spoke with patient's wife, informed her of SN's recs as stated above.  cynthis verbalized her understanding.  rx called to verified pharmacy.  advised cynthia to call if pt's symptoms do not improve or worsen; pt wife verbalized her understanding. Boone Master CNA/MA  December 20, 2009 12:28 PM     New/Updated Medications: Albesa Seen DOSEPAK 10MG  take as directed x6days Prescriptions: STERAPRED DOSEPAK 10MG  take as directed x6days  #1 x 0   Entered by:   Boone Master CNA/MA   Authorized by:   Michele Mcalpine MD   Signed by:   Boone Master CNA/MA on 12/20/2009   Method used:   Telephoned to ...       [facility]. [phone]* (retail)       [address]       [place], [place]  [address]       Ph: [phone] or [phone]       Fax: [phone]   RxID:   [id number]

## 2010-03-29 NOTE — Assessment & Plan Note (Signed)
Summary: rov for osa   Visit Type:  Follow-up Copy to:  Kriste Basque Primary Provider/Referring Provider:  Alroy Dust  CC:  1 year follow up. pt states he wears cpap everynight x 5-8 hrs a night. pt states he needs a new mask. pt states overall he is doing great on cpap. Marland Kitchen  History of Present Illness: the pt comes in today for f/u of his osa.  He has been wearing cpap compliantly, and has no issues with pressure tolerance.  He is due for a new mask, and would like to look at some of the other options to "see what's out there".  He feels he is sleeping well, and is satisfied with his daytime alertness.  Current Medications (verified): 1)  Bayer Aspirin 325 Mg  Tabs (Aspirin) .... Once Daily 2)  Plavix 75 Mg  Tabs (Clopidogrel Bisulfate) .Marland Kitchen.. 1 Tab By Mouth Once Daily.Marland KitchenMarland Kitchen 3)  Toprol Xl 50 Mg  Tb24 (Metoprolol Succinate) .... Take 1 Tablet By Mouth Once A Day 4)  Nitroquick 0.4 Mg  Subl (Nitroglycerin) .... Use As Directed... 5)  Synthroid 175 Mcg  Tabs (Levothyroxine Sodium) .Marland Kitchen.. 1 Tab By Mouth Once Daily.Marland KitchenMarland Kitchen 6)  Multivitamins   Tabs (Multiple Vitamin) .... Once Daily 7)  Flexeril 10 Mg  Tabs (Cyclobenzaprine Hcl) .... 1/2 To 1 Tab By Mouth Three Times A Day As Needed For Muscle Spasms.Marland KitchenMarland Kitchen 8)  Allopurinol 300 Mg Tabs (Allopurinol) .... Take 1 Tablet By Mouth Once A Day  Allergies (verified): 1)  ! Statin Meds 2)  Ibuprofen  Past History:  Past medical, surgical, family and social histories (including risk factors) reviewed, and no changes noted (except as noted below).  Past Medical History: Reviewed history from 08/03/2009 and no changes required. HYPERTENSION (ICD-401.9) CAD, UNSPECIFIED SITE (ICD-414.00) OVERWEIGHT/OBESITY (ICD-278.02) ALLERGIC RHINITIS (ICD-477.9) OBSTRUCTIVE SLEEP APNEA (ICD-327.23) ASTHMATIC BRONCHITIS, ACUTE (ICD-466.0) HYPERTENSION (ICD-401.9) ARTERIOSCLEROTIC HEART DISEASE (ICD-414.00) HYPERLIPIDEMIA (ICD-272.4) HYPOTHYROIDISM (ICD-244.9) DEGENERATIVE JOINT  DISEASE (ICD-715.90) HYPERURICEMIA (ICD-790.6) SURG/OTH PROC NOT CARRIED OUT BECAUSE PTS DECN (ICD-V64.2)  CAD...... DES for ST elevation MI, June, 2006  /   DES  LAD....8/ 2006   /  cath 7/ 2007...stents patent  /   myoview  January, 2010...no ischemia EF  55%...cath...7/  2007   /   myoview..60%..02/2008 Overweight Statin intolerance  Past Surgical History: Reviewed history from 08/03/2009 and no changes required. S/P left shoulder surg 7/08 by DrSypher Taxus drug-eluting stent on September 27, 2004 by Salvadore Farber, M.D  Family History: Reviewed history from 05/22/2007 and no changes required. heart disease: brother  Social History: Reviewed history from 05/24/2008 and no changes required. Patient states former smoker.  pt is married. pt has children. Tobacco Use - Yes.  Alcohol Use - yes -- rarely  Review of Systems       The patient complains of nasal congestion/difficulty breathing through nose.  The patient denies shortness of breath with activity, shortness of breath at rest, productive cough, non-productive cough, coughing up blood, chest pain, irregular heartbeats, acid heartburn, indigestion, loss of appetite, weight change, abdominal pain, difficulty swallowing, sore throat, tooth/dental problems, headaches, sneezing, itching, ear ache, anxiety, depression, hand/feet swelling, joint stiffness or pain, rash, change in color of mucus, and fever.    Vital Signs:  Patient profile:   60 year old male Height:      73 inches Weight:      261.25 pounds BMI:     34.59 O2 Sat:      95 % on Room air Temp:  98.0 degrees F oral Pulse rate:   89 / minute BP sitting:   130 / 82  (left arm) Cuff size:   large  Vitals Entered By: Carver Fila (December 30, 2009 10:25 AM)  O2 Flow:  Room air CC: 1 year follow up. pt states he wears cpap everynight x 5-8 hrs a night. pt states he needs a new mask. pt states overall he is doing great on cpap.    Physical Exam  General:  ow  male in nad Nose:  no skin breakdown or pressure necrosis from cpap mask  Extremities:  no significant edema, no cyanosis  Neurologic:  alert and oriented, moves all 4 does not appear sleepy.   Impression & Recommendations:  Problem # 1:  OBSTRUCTIVE SLEEP APNEA (ICD-327.23) the pt is doing well with cpap, and feels he has benefitted from therapy.  He is due for a new mask and supplies, and I have also asked him to work on weight loss.  Will make a referral to his dme to show him some of the newer masks.  If he continues to do well, will see him back in one year.  Other Orders: Est. Patient Level III ([address]) DME Referral (DME)  Patient Instructions: 1)  will send an order to advanced to get them to show you some of the newer masks. 2)  continue to work on weight loss 3)  followup with me in one year if doing well.

## 2010-04-03 ENCOUNTER — Ambulatory Visit: Payer: Self-pay | Admitting: Pulmonary Disease

## 2010-04-18 ENCOUNTER — Encounter: Payer: Self-pay | Admitting: Cardiology

## 2010-04-18 ENCOUNTER — Ambulatory Visit (INDEPENDENT_AMBULATORY_CARE_PROVIDER_SITE_OTHER): Payer: 59 | Admitting: Cardiology

## 2010-04-18 DIAGNOSIS — I2510 Atherosclerotic heart disease of native coronary artery without angina pectoris: Secondary | ICD-10-CM

## 2010-04-18 DIAGNOSIS — I10 Essential (primary) hypertension: Secondary | ICD-10-CM

## 2010-04-25 NOTE — Assessment & Plan Note (Signed)
Summary: fg1y      Allergies Added:   Current Medications (verified): 1)  Bayer Aspirin 325 Mg  Tabs (Aspirin) .... Once Daily 2)  Plavix 75 Mg  Tabs (Clopidogrel Bisulfate) .Marland Kitchen.. 1 Tab By Mouth Once Daily.Marland KitchenMarland Kitchen 3)  Toprol Xl 50 Mg  Tb24 (Metoprolol Succinate) .... Take 1 Tablet By Mouth Once A Day 4)  Nitroquick 0.4 Mg  Subl (Nitroglycerin) .... Use As Directed... 5)  Synthroid 175 Mcg  Tabs (Levothyroxine Sodium) .Marland Kitchen.. 1 Tab By Mouth Once Daily.Marland KitchenMarland Kitchen 6)  Multivitamins   Tabs (Multiple Vitamin) .... Once Daily 7)  Flexeril 10 Mg  Tabs (Cyclobenzaprine Hcl) .... 1/2 To 1 Tab By Mouth Three Times A Day As Needed For Muscle Spasms...  Allergies (verified): 1)  ! Statin Meds 2)  Ibuprofen  Past History:  Past Medical History: Last updated: 08/03/2009 HYPERTENSION (ICD-401.9) CAD, UNSPECIFIED SITE (ICD-414.00) OVERWEIGHT/OBESITY (ICD-278.02) ALLERGIC RHINITIS (ICD-477.9) OBSTRUCTIVE SLEEP APNEA (ICD-327.23) ASTHMATIC BRONCHITIS, ACUTE (ICD-466.0) HYPERTENSION (ICD-401.9) ARTERIOSCLEROTIC HEART DISEASE (ICD-414.00) HYPERLIPIDEMIA (ICD-272.4) HYPOTHYROIDISM (ICD-244.9) DEGENERATIVE JOINT DISEASE (ICD-715.90) HYPERURICEMIA (ICD-790.6) SURG/OTH PROC NOT CARRIED OUT BECAUSE PTS DECN (ICD-V64.2)  CAD...... DES for ST elevation MI, June, 2006  /   DES  LAD....8/ 2006   /  cath 7/ 2007...stents patent  /   myoview  January, 2010...no ischemia EF  55%...cath...7/  2007   /   myoview..60%..02/2008 Overweight Statin intolerance  Vital Signs:  Patient profile:   61 year old male Height:      73 inches Weight:      253 pounds BMI:     33.50 Pulse rate:   67 / minute BP sitting:   156 / 80  (left arm) Cuff size:   regular  Vitals Entered By: Hardin Negus, RMA (April 18, 2010 3:35 PM)   Other Orders: EKG w/ Interpretation (93000)  Appended Document: [facility]      Visit Type:  Follow-up Primary Provider:  Alroy Dust  CC:  CAD.  History of Present  Illness: Patient is seen for followup of coronary artery disease.  I saw him last February, 2011.  He had stents to his right coronary artery and LAD in the past.  He has some mild residual disease in other vessels.  Catheterization in 2007 revealed that his stents were patent.  Myoview in 2010 revealed no ischemia.  LV function is normal.  When I saw him last year there is question of some chest tightness and I decided to leave him on Plavix.  Since that time he has not had any significant problems.  Allergies: 1)  ! Statin Meds 2)  Ibuprofen  Past History:  Past Medical History: HYPERTENSION (ICD-401.9) CAD, UNSPECIFIED SITE (ICD-414.00) OVERWEIGHT/OBESITY (ICD-278.02) ALLERGIC RHINITIS (ICD-477.9) OBSTRUCTIVE SLEEP APNEA (ICD-327.23) ASTHMATIC BRONCHITIS, ACUTE (ICD-466.0) HYPERTENSION (ICD-401.9) ARTERIOSCLEROTIC HEART DISEASE (ICD-414.00) HYPERLIPIDEMIA (ICD-272.4) HYPOTHYROIDISM (ICD-244.9) DEGENERATIVE JOINT DISEASE (ICD-715.90) HYPERURICEMIA (ICD-790.6) SURG/OTH PROC NOT CARRIED OUT BECAUSE PTS DECN (ICD-V64.2)..  CAD...... DES for ST elevation MI, June, 2006  /   DES  LAD....8/ 2006   /  cath 7/ 2007...stents patent  /   myoview  January, 2010...no ischemia EF  55%...cath...7/  2007   /   myoview..60%..02/2008 Overweight Statin intolerance  Review of Systems       The patient denies fever, chills, headache, sweats, rash,chest pain, shortness of breath, nausea vomiting, urinary symptoms.  All of the systems are reviewed and are negative.  Physical Exam  General:  patient is stable. Head:  head is atraumatic. Eyes:  no  xanthelasma. Neck:  no jugular venous stenting. Chest Wall:  no chest wall tenderness. Lungs:  lungs are clear.  Respiratory effort is nonlabored. Heart:  cardiac exam reveals S1 and S2 present no clicks or significant murmurs. Abdomen:  abdomen is soft. Msk:  no musculoskeletal deformities. Extremities:  no peripheral edema. Skin:  no skin  rash. Psych:  patient is oriented to person time and place.  Affect is normal.   Impression & Recommendations:  Problem # 1:  HYPERTENSION (ICD-401.9) The patient's systolic blood pressure is elevated today.  I've encouraged him to check his pressure with his cuff at home and be in touch with [place] about the readings.  We need to have better control if possible.  Problem # 2:  CAD, UNSPECIFIED SITE (ICD-414.00) Coronary disease is stable.  I have reviewed his anatomy from the past.  We know that he had stents placed in the right coronary and LAD.  There is some other residual disease.  After very careful discussion I feel it is safe for him to stop his Plavix at this time.  He would like to do this.  EKG was done today and reviewed by me.  It is normal.  Problem # 3:  HYPOTHYROIDISM (ICD-244.9)  His updated medication list for this problem includes:    Synthroid 175 Mcg Tabs (Levothyroxine sodium) .Marland Kitchen... 1 tab by mouth once daily... Thyroid history.  Patient Instructions: 1)  Your physician has requested that you regularly monitor and record your blood pressure readings at home.  Please use the same machine at the same time of day to check your readings and record them to bring to your follow-up visit.  Give [place] a call with your BP readings. 2)  Your physician wants you to follow-up in:  1 year.  You will receive a reminder letter in the mail two months in advance. If you don't receive a letter, please call our office to schedule the follow-up appointment.

## 2010-07-11 NOTE — Letter (Signed)
August 15, 2006    Katy Fitch. Sypher, M.D.  [facility]  [address]  [place], [place] [address]   RE:  JEREK, MEULEMANS  MRN:  [id number]  /  DOB:  Jun 23, 1950   Dear Dr. Teressa Senter:   Thank you for referring Mr. Zbikowski for preoperative cardiac evaluation  before his planned left shoulder surgery. As you know, Mr. 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 We treated that with a drug eluting stent into the posterior  left ventricular branch of his right coronary. Shortly thereafter, he  underwent drug eluting stent placement in the mid portion of his left  anterior descending artery. His ejection fraction is normal. He has not  had any symptoms concerning for heart failure or recurrent angina.   Based on his asymptomatic status and normal left ventricular systolic  function, he is at low risk of perioperative complication. Since he has  drug eluting stents in two vessels, I would prefer that he be maintained  on both aspirin and Plavix throughout the perioperative period. If you  feel that the bleeding risk is unacceptable with this, aspirin alone  would be a reasonable second choice from a cardiac viewpoint.   If you have any questions, please feel free to page me at [phone].   Thank you once again for the opportunity to participate in Mr. Frasier's  care.    Sincerely,      Salvadore Farber, MD  Electronically Signed    WED/MedQ  DD: 08/15/2006  DT: 08/15/2006  Job #: [id number]

## 2010-07-11 NOTE — Assessment & Plan Note (Signed)
[facility]                            CARDIOLOGY OFFICE NOTE   NAME:[name], [name]                      MRN:          [id number]  DATE:08/15/2006                            DOB:          08/29/1950    PRIMARY CARE PHYSICIAN:  [name], M.D.   REASON FOR CONSULTATION:  Preoperative cardiac evaluation before left  shoulder surgery.   HISTORY OF PRESENT ILLNESS:  [name] is a 60 year old gentleman who  suffered inferior myocardial infarction in June, 2006, treated with a  Horizon study stent to the posterior left ventricular branch of the  right coronary artery.  In August, 2006, he underwent placement of a  Taxus drug-eluting stent in the mid LAD.  He has an 80% residual  stenosis in the 2 mm circumflex, which we have been managing medically.   He remains active and has not had any chest discomfort, PND, orthopnea,  exertional dyspnea, edema, claudication, palpitations, or syncope.  In  short, he feels himself to be doing very well.   PAST MEDICAL HISTORY:  1. Inferior myocardial infarction.  2. Drug-eluting stents in LAD and RCA.  3. Hypertension.  4. Hypercholesterolemia.  5. Hypothyroidism.  6. Glucose intolerance.   CURRENT MEDICATIONS:  1. Plavix 75 mg daily.  2. Aspirin 325 mg daily.  3. Multivitamin.  4. Synthroid 175 mcg daily.  5. Claritin 10 mg daily.  6. Toprol XL 50 mg daily.  7. Etodalac 400 mg twice daily.   ALLERGIES:  IBUPROFEN has caused a rash in the past.   SOCIAL HISTORY:  Quit smoking in June, 2006.  He is married with two  sons.  Works as a Curator.   FAMILY HISTORY:  Coronary disease in a brother.  Parents are alive and  well in their 60s.   REVIEW OF SYSTEMS:  Negative in detail except as above.   PHYSICAL EXAMINATION:  GENERAL:  He is generally well-appearing in no  distress.  VITAL SIGNS:  Heart rate 75, blood pressure 138/76, weight 250 pounds.  Weight is up 10 pounds from when I saw him in  February.  HEENT:  Normal.  SKIN:  Normal.  MUSCULOSKELETAL:  Normal with the exception of some decreased range of  motion and pain with motion of the left shoulder.  NECK:  No jugular venous distention, thyromegaly, or lymphadenopathy.  LUNGS:  Respiratory effort is normal.  Lungs are clear to auscultation.  CARDIAC:  He has a nondisplaced point of maximal cardiac impulse.  There  is a regular rate and rhythm without murmur, rub or gallop.  ABDOMEN:  Soft, nontender, nondistended.  There is no  hepatosplenomegaly.  No midline pulsatile masses.  Bowel sounds are  normal.  EXTREMITIES:  Warm without clubbing, cyanosis, edema, or ulceration.  VASCULAR:  Carotid pulses 2+ bilaterally without bruits.  NEUROLOGIC:  He is alert and oriented x3 with normal affect and normal  neurologic exam.   ELECTROCARDIOGRAM:  Normal sinus rhythm with normal EKG.   IMPRESSION/RECOMMENDATIONS:  1. Preoperative risk stratification:  Good exercise tolerance.  Able      to go up  and down a flight of stairs with no difficulty.  EF is      preserved.  Continue beta blocker throughout the perioperative      period and then indefinitely due to his prior myocardial      infarction.  Continue with both aspirin and Plavix throughout the      perioperative period.  2. Hypercholesterolemia:  Intolerant to multiple statins.  Followed in      our lipid clinic.  3. Hypertension:  Well controlled.  4. Hypothyroidism:  Per Dr. Kriste [name].     Salvadore Farber, MD  Electronically Signed    WED/MedQ  DD: 08/15/2006  DT: 08/15/2006  Job #: [id number]

## 2010-07-11 NOTE — Assessment & Plan Note (Signed)
[facility]                            CARDIOLOGY OFFICE NOTE   NAME:[name], [name]                      MRN:          [id number]  DATE:03/16/2008                            DOB:          [birth year]    PRIMARY CARDIOLOGIST:  Luis Abed, MD, [facility]   PRIMARY CARE Shivon Hackel:  Lonzo Cloud. Kriste Basque, MD   PATIENT PROFILE:  A 60 year old Caucasian male with prior history of CAD  status post acute inferior MI in 2006, who presents secondary to recent  history of chest pain.   PROBLEMS:  1. Chest pain/coronary artery disease.      a.     August 23, 2004, acute inferior ST elevation MI with       catheterization at that time revealing occlusion of the right       posterior left ventricular branch and this was successfully       stented with placement of a 2.75 x 24-mm Horizon  study drug-       eluting stent.  The patient also had LAD disease which was       subsequently stented on September 27, 2004, with placement of a 2.75 x       12 mm Taxus drug-eluting stent.      b.     September 11, 2005, cardiac catheterization revealing patent LAD       and RPLV stents, otherwise nonobstructive disease, EF 55%.  2. Hypertension.  3. Hyperlipidemia (the patient does not tolerate statins).  4. Hypothyroidism.  5. Obesity.  6. Sleep apnea on CPAP, followed by Dr. Kriste Basque.   HISTORY OF PRESENT ILLNESS:  A 60 year old Caucasian male with the above  problems.  Approximately 1 month ago in the setting of his father's  illness and subsequent death, he began to experience daily mid upper  chest 1/10 pressure without associated symptoms.  He felt as though he  had gas in his upper chest and this was off, improved with belching.  Symptoms typically last approximately 15 minutes.  Symptoms were only  occurring with rest and never with exertion.  He states that these  symptoms are very different from his previous angina which included jaw  pain and marked diaphoresis.  He has been  walking one mile per night  with his dog without any reproduction of the chest pressure.  He  recently was seen in Pulmonology Clinic and was placed on Aciphex  therapy.  Given his history of coronary disease with new complaints of  chest discomfort, he was referred to [place].  He reports that since being  initially on Aciphex, his symptoms have improved significantly whereas  the symptoms may be 1/10 previously, now they lasted 1/10, but have  continued to occur.  Again, he has no exertional symptoms.   ALLERGIES:  IBUPROFEN.   HOME MEDICATIONS:  1. Plavix 75 mg daily.  2. Aspirin 325 mg daily.  3. Multivitamin daily.  4. Synthroid 175 mcg daily.  5. Toprol-XL 50 mg daily.   PHYSICAL EXAMINATION:  VITAL SIGNS:  His blood pressure is 147/84, heart  rate 73,  respirations 18, his weight is 253 pounds, which is no change  from August 2009.  GENERAL:  A pleasant white male in no acute distress.  Awake, alert, and  oriented x3.  HEENT:  Normal.  PSYCHOLOGICAL:  Normal affect.  NEUROLOGICAL:  Grossly intact, nonfocal.  SKIN:  Warm and dry without lesions or masses.  MUSCULOSKELETAL:  Grossly normal without deformity or effusions.  NECK:  No bruits, JVD.  LUNGS:  Respirations are regular and unlabored.  CARDIAC:  Regular S1 and S2, no S3, S4, murmurs.  ABDOMEN:  Round, soft, nontender, nondistended.  Bowel sounds present.  EXTREMITIES:  Warm, dry, pink with trace bilateral lower extremity  edema.  There is no clubbing, cyanosis, or petechiae.  Dorsalis pedis,  posterior tibial pulses 2+ and equal bilaterally.   ACCESSORY AND CLINICAL FINDINGS:  EKG showed sinus rhythm without acute  ST or T changes.   ASSESSMENT AND PLAN:  1. Chest pain/coronary artery disease.  The patient presents with a 1-      month history of somewhat atypical chest discomfort occurring only      at rest, never with exertion and better with belching and      subsequently Aciphex therapy.  Because he does have a  history of      coronary artery disease, we will obtain an exercise Myoview to rule      out ischemia.  He remains on aspirin, Plavix, and beta-block      therapy.  The patient reports intolerance to STATINS.  2. Hypertension.  Blood pressure is elevated today and also was      elevated when seen by Pulmonology.  He does not routinely check his      blood pressure at home.  His heart rate has been in the 70 and I      recommended that we increase his Toprol to 75 mg daily; however, he      would prefer at this time to check his blood pressure at home prior      to making any medication changes.  I felt that this would be      acceptable.  3. History of hyperlipidemia.  He apparently is intolerant to statins      or reluctant to take statins.  4. Hypothyroidism.  He remains on Synthroid therapy and is followed by      primary care.   DISPOSITION:  The patient will follow up with Dr. Myrtis Ser in approximately  2 weeks after his Myoview.      Nicolasa Ducking, ANP  Electronically Signed      Rollene Rotunda, MD, [facility]  Electronically Signed   CB/MedQ  DD: 03/16/2008  DT: 03/17/2008  Job #: [id number]

## 2010-07-11 NOTE — Assessment & Plan Note (Signed)
[facility]                            CARDIOLOGY OFFICE NOTE   NAME:[name] [name] TURAY                      MRN:          [id number]  DATE:04/01/2008                            DOB:          11-28-1950    [name] [name] has known coronary artery disease.  He was seen in the office  on March 16, 2008 by Nicolasa Ducking.  At that time, he was stable,  but plans were made for a stress Myoview because he had had some  symptoms.  The patient had the exercise test.  There was no definite  sign of ischemia.  He is feeling better.  He is here today and doing  well.   PAST MEDICAL HISTORY:   ALLERGIES:  IBUPROFEN.  He does not tolerate CHOLESTEROL MEDS.   MEDICATIONS:  See the flow sheet.   OTHER MEDICAL PROBLEMS:  See the list in the note of March 16, 2008.   REVIEW OF SYSTEMS:  He has no fevers or chills.  There is no headache.  There are no skin rashes.  His review of systems is negative.   PHYSICAL EXAMINATION:  VITAL SIGNS:  Blood pressure is 150/84 with a  pulse of 87.  GENERAL:  The patient is oriented to person, time, and place.  Affect is  normal.  HEENT:  No xanthelasma.  He has normal extraocular motion.  NECK:  There are no carotid bruits.  There is no jugular venous  distention.  LUNGS:  Clear.  Respiratory effort is not labored.  CARDIAC:  An S1 with an S2.  There are no clicks or significant murmurs.  ABDOMEN:  Soft.  He has no peripheral edema.   As outlined, he had a stress Myoview scan.  There was no significant  abnormality.   The patient has coronary artery disease, post intervention in the past.  His overall status is stable.  I will see him back in 1 year.     [name] Abed, MD, [facility]  Electronically Signed    JDK/MedQ  DD: 04/01/2008  DT: 04/01/2008  Job #: [id number]

## 2010-07-11 NOTE — Assessment & Plan Note (Signed)
[facility]                            CARDIOLOGY OFFICE NOTE   NAME:[name], [name] CONNORS                      MRN:          [id number]  DATE:09/18/2006                            DOB:          1950/08/08    I just spoke to Dr. Teressa Senter.  [name] will definitely need  perioperative and immediate postoperative telemetry.  He has been  advised to take his Toprol XL that morning.  If he has extended stay, he  does not have to be on telemetry.  He has preserved LV function and his  coronary disease is stable.  Dr. Teressa Senter said he would continue his  Plavix and his aspirin.     Thomas C. Daleen Squibb, MD, [facility]  Electronically Signed    TCW/MedQ  DD: 09/18/2006  DT: 09/18/2006  Job #: [id number]   cc:   Katy Fitch. Sypher, M.D.

## 2010-07-11 NOTE — Op Note (Signed)
NAME:  [name], [name]               ACCOUNT NO.:  [id number]   MEDICAL RECORD NO.:  [id number]          PATIENT TYPE:  AMB   LOCATION:  DSC                          FACILITY:  [facility]   PHYSICIAN:  Katy Fitch. Sypher, M.D. DATE OF BIRTH:  1950-06-07   DATE OF PROCEDURE:  09/19/2006  DATE OF DISCHARGE:                               OPERATIVE REPORT   PREOPERATIVE DIAGNOSIS:  Stage III impingement, left shoulder, with MRI  and plain x-ray-documented acromioclavicular joint arthropathy and  unfavorable acromial anatomy and full-thickness tear of supraspinatus  rotator cuff tendon.   POSTOPERATIVE DIAGNOSIS:  Bursal side 95% retracted tear of  supraspinatus and conjoint tendon measuring approximately 4 cm in width  and retracted approximately 3 cm.   OPERATION:  1. Diagnostic arthroscopy of the left shoulder.  2. Arthroscopic debridement of labrum and granulation tissue within      glenohumeral joint.  3. Arthroscopic subacromial decompression with bursectomy,      coracoacromial ligament release, and acromioplasty.  4. Arthroscopic resection of distal clavicle.  5. Open repair of bursal-side retracted rotator cuff tear utilizing      through-bone suture technique.   OPERATING SURGEON:  Katy Fitch. Sypher, MD   ASSISTANT:  Annye Rusk PA-C   ANESTHESIA:  General endotracheal without supplemental interscalene  block, supervising anesthesiologist is Dr. Noreene Larsson.   INDICATIONS:  [name] is a 60 year old gentleman referred through  the courtesy of Dr. Alroy Dust for evaluation of chronically painful  left shoulder.  Clinical examination revealed signs of stage III  impingement and AC arthropathy.  Plain x-rays confirmed AC arthropathy  and very unfavorable acromial anatomy.   [name] was sent for an MRI of his left shoulder in early June.  This  revealed evidence of a full-thickness tear of the supraspinatus tendon  just posterior to the rotator interval.   Due to a failure  respond to nonoperative measures, he is brought to the  operating room at this time anticipating subacromial decompression,  distal clavicle resection, and reconstruction of the rotator cuff.  He  understands that we will proceed with appropriate intervention based on  our diagnostic arthroscopy.   Preoperatively he was advised of the potential risks and benefits of  surgery.  Questions were invited and answered.   PROCEDURE:  [name] was brought to the operating room and placed  in supine position on the operating table.   Following an anesthesia consult with Dr. Noreene Larsson, it was determined that  due to his use of aspirin and Plavix, an interscalene block was not  indicated.  Preoperatively, [name] was seen by his attending  cardiologist at [facility] and it was advised that he could  proceed with outpatient surgery but needed to maintain his Toprol,  Plavix and aspirin during the perioperative period.   He was brought to room #8, placed in supine position on the operating  table, and under Dr. Morley Kos direct supervision general endotracheal  anesthesia induced.  He was carefully positioned in the beach-chair  position with a aid of a torso and head holder designed for shoulder  arthroscopy.  Examination of the shoulder under anesthesia revealed  stability in all planes of motion.  He had no sign of significant  capsular contracture.   The left arm was prepped with DuraPrep and draped with impervious  arthroscopy drapes.  The scope was introduced through a standard  posterior viewing portal.   Diagnostic arthroscopy revealed degenerative tearing of the labrum from  approximately 1 o'clock to 3 o'clock anteriorly.  There was also a  moderate degree of granulation tissue in the anterior rotator interval  region due to early adhesive capsulitis.   A 4.5-mm suction shaver was used to debride the labrum and granulation  tissue, followed by use of bipolar cautery for  hemostasis.  The deep  surface of the supraspinatus, infraspinatus, teres minor and  subscapularis were all intact.  The biceps had a normal origin at the  superior labrum and was normal through the rotator interval.  The  inferior recess was unremarkable.   The scope was removed from the glenohumeral joint and placed in the  subacromial space.  Considerable bursitis was noted and the very  unfavorable anterior lateral acromial anatomy noted.  There was a donut  hole-type, highly degenerative retracted tear of the entire  supraspinatus beginning just posterior to the rotator interval.  This  extended down to a paper-thin remnant of intact deep-side supraspinatus.  The margins were hyalinized and significantly retracted.   Photographic documentation of the tear was accomplished, followed by  bursectomy, release of the coracoacromial ligament, and leveling the  acromion to a type 1 morphology.  The AC capsule was taken down as the  capsule and distal clavicle were very prominent.  A suction bur was used  to remove the distal 15 mm of clavicle.  Hemostasis was achieved with  bipolar cautery and remnants of the capsule were removed with the 4.5-mm  suction shaver.   After debridement of the margins of the cuff tear, we marked the most  appropriate interval for exposure of the cuff with a muscle-splitting  incision followed by creation of a 4-cm longitudinal skin incision  directly over the tear.  The bursa and deltoid were split with an  extensile exposure, followed by freshening of the margins of the tear  with a 15 scalpel blade.  The tear was retrieved by use of a large-  caliber skin hook, pulling the retracted supraspinatus back to an  anatomic position, and the tear was ultimately repaired with a total of  two through-bone grasping sutures of #2 FiberWire and two simple sutures  of #2 FiberWire, finishing the margins with knots inverted and buried  within the substance of the  tendon.  Excellent approximation of the  retracted portion of the supraspinatus was achieved.   The lateral lip of the acromion was then contoured with a hand rasp to  prevent postoperative impingement, followed by irrigation of the  subacromial space with the arthroscopic cannula and pump.  The deltoid  split was repaired with mattress suture of #2 FiberWire at the superior  margin of the deltoid split, followed by simple closure of the deltoid  fibers with interrupted 0 Vicryl.  The skin was repaired with subdermal  sutures of 2-0 Vicryl and intradermal 3-0 Prolene with Steri-Strips.   There were no apparent complications.   Mr. Current tolerated the surgery and anesthesia well.  He was transferred  to the recovery room with stable vital signs.      Katy Fitch Sypher, M.D.  Electronically Signed  RVS/MEDQ  D:  09/19/2006  T:  09/19/2006  Job:  [id number]   cc:   Lonzo Cloud. Kriste Basque, MD

## 2010-07-11 NOTE — Procedures (Signed)
NAME:  [name], ANSTEAD NO.:  [id number]   MEDICAL RECORD NO.:  [id number]          PATIENT TYPE:  OUT   LOCATION:  SLEEP CENTER                 FACILITY:  [facility]   PHYSICIAN:  Barbaraann Share, MD,FCCPDATE OF BIRTH:  10-19-51   DATE OF STUDY:  04/15/2007                            NOCTURNAL POLYSOMNOGRAM   REFERRING PHYSICIAN:  Lonzo Cloud. Kriste Basque, MD   INDICATION FOR STUDY:  Hypersomnia with sleep apnea.   EPWORTH SLEEPINESS SCORE:  12   MEDICATIONS:   SLEEP ARCHITECTURE:  The patient had a total sleep time of 347 minutes  with very little slow wave sleep and decreased REM.  Sleep onset latency  was prolonged at 41 minutes and REM onset was prolonged at 179 minutes.  Sleep efficiency was moderately decreased at 79%.   RESPIRATORY DATA:  The patient was found to have 14 apneas and 73  hypopneas for an apnea/hypopnea index of 15 events per hour.  The events  were not positional but they were clearly worse during supine REM.  Mild  snoring was noted throughout.   OXYGEN DATA:  The patient had O2 desaturation as low as 77% with his  obstructive events.   CARDIAC DATA:  Occasional PVCs but no clinically significant arrhythmia  was noted.   MOVEMENT-PARASOMNIA:  None.   IMPRESSIONS-RECOMMENDATIONS:  1. Mild obstructive sleep apnea/hypopnea syndrome with an      apnea/hypopnea index of 15 events per hour and oxygen desaturation      as low as 77%.  Treatment for this degree of sleep apnea can      include weight loss alone if applicable, upper airway surgery, oral      appliance, and also CPAP.  Clinical correlation is suggested.  2. Occasional premature ventricular contractions but no clinically      significant arrhythmias were noted.     Barbaraann Share, MD,FCCP  Diplomate, American Board of Sleep  Medicine  Electronically Signed    KMC/MEDQ  D:  04/24/2007 08:14:24  T:  04/24/2007 16:52:27  Job:  [id number]

## 2010-07-11 NOTE — Assessment & Plan Note (Signed)
[facility]                            CARDIOLOGY OFFICE NOTE   NAME:Hoefle, ANGIE HOGG                      MRN:          [id number]  DATE:10/08/2007                            DOB:          1950-07-07    Mr. Fung has known coronary disease.  Fortunately, he has done quite  well.  He is not having any significant chest pain.  He was seen by Dr.  Kriste Basque in February 2009.  He underwent procedures in 2006 after an  inferior MI.  He also later had a Taxus stent to the LAD with residual  stenosis of a small circumflex.  We will keep him on Plavix long-term.   ALLERGIES:  IBUPROFEN.   MEDICATIONS:  1. Plavix 75.  2. Aspirin 325.  3. Multivitamins.  4. Synthroid.  5. Toprol.  He cannot tolerate any cholesterol meds.   OTHER MEDICAL PROBLEMS:  See the list on my note of April 15, 2007.   REVIEW OF SYSTEMS:  Negative.  He is doing quite well.   PHYSICAL EXAMINATION:  Weight is 253 pounds.  This is up an additional  pound or 2, and he and I discussed trying to lose weight.  Blood  pressure is 138/86 with a pulse of 63.  The patient is oriented to  person, time and place.  Affect is normal.  HEENT reveals no  xanthelasma.  He has normal extraocular motion.  There are no carotid  bruits.  There is no jugular venous distention.  Lungs are clear.  Respiratory effort is not labored.  Cardiac exam reveals an S1 and S2.  There are no clicks or significant murmurs.  His abdomen is soft.  He  has no peripheral edema.   PROBLEMS:  1. History of inferior myocardial infarction.  2. History of drug-eluting stent to the left anterior descending and      right coronary arteries in 2006.  He will remain on aspirin and      Plavix.  3. Hypertension treated.  4. Hypercholesterolemia.  Our lipid clinic review extensively has      shown that he cannot tolerate cholesterol medicines.  5. Hypothyroidism treated.   He is stable.  I will see him back in 1 year for  cardiology followup.    Luis Abed, MD, [facility]  Electronically Signed   JDK/MedQ  DD: 10/08/2007  DT: 10/09/2007  Job #: [id number]

## 2010-07-11 NOTE — Assessment & Plan Note (Signed)
[name]                            CARDIOLOGY OFFICE NOTE   NAME:[name], [name]                      MRN:          [id number]  DATE:04/15/2007                            DOB:          17-Nov-1950    [name] is seen for cardiology follow-up.  He had been managed by Dr.  Samule Ohm.  Dr. Samule Ohm has moved to [place], [facility], and I will  be taking over [name]'s cardiology care.  He has known coronary  disease.  He had an inferior MI in June 2006, treated with a HORIZON  study stent to the posterior left ventricular branch of the right  coronary artery.  His symptom at that time was a tightening in the neck.  In August of the same year, 2006, he underwent placement of a Taxus  stent to the mid-LAD.  He has an 80% residual stenosis and a 2 mm  circumflex.  This is managed medically.  He is kept on Plavix at this  time.  He is not having any of his throat or pain or chest pain.  He has  no shortness of breath.  He has gained some weight and we talked about  this and he will try to lose some weight.  He saw Dr. Kriste Basque earlier  today for general medical follow-up.  He had labs checked and these  hopefully have included his cholesterol.  The patient unfortunately has  been through our lipid clinic and can not tolerate any of the Statin-  containing lipid medications.   ALLERGIES:  IBUPROFEN.   MEDICATIONS:  1. Plavix 75.  2. Aspirin 325.  3. Multivitamin.  4. Synthroid 175 mcg.  5. Claritin.  6. Toprol 50.   OTHER MEDICAL PROBLEMS:  See the list below.   REVIEW OF SYSTEMS:  Overall he is doing very well and he has no  complaints.   PHYSICAL EXAMINATION:  VITAL SIGNS:  Weight on our scale is 257 pounds.  Blood pressure is 150/89.  His pressure was checked earlier today in Dr.  Jodelle Green office.  He was not told of any difficulties.  He does have a  blood pressure cuff at home and he will be checking it.  His pulse is  89.  GENERAL  APPEARANCE:  The patient is oriented to person, time and place.  Affect is normal.  HEENT:  No xanthelasma.  He has normal extraocular motion.  NECK:  There are no carotid bruits.  There is no jugular venous  distention.  LUNGS:  Clear.  Respiratory effort is not labored.  CARDIAC:  Exam reveals and S1 with an S2.  There are no clicks or  significant murmurs.  ABDOMEN:  Soft.  EXTREMITIES:  He has no peripheral edema.   EKG reveals no significant abnormalities.   PROBLEMS:  1. History of an inferior myocardial infarction.  2. Drug-eluting stent to the left anterior descending and right      coronary artery in August 2006.  3. Hypertension.  He will follow up his blood pressure home.  4. Hypercholesterolemia and intolerance to cholesterol medicines.  5. Hypothyroidism.  6. Some glucose intolerance.   [name] is stable today.  I have not changed any medicines.  I have  gotten to know him as a new patient and I will be following him over  time.  No change in his meds.  I will see him back six months.  He will  check his blood pressure at home and let us know.     [name] Abed, MD, [facility]  Electronically Signed    JDK/MedQ  DD: 04/15/2007  DT: 04/16/2007  Job #: [id number]   cc:   Lonzo Cloud. Kriste Basque, MD

## 2010-07-14 NOTE — H&P (Signed)
NAME:  [name], [name] NO.:  [id number]   MEDICAL RECORD NO.:  [id number]          PATIENT TYPE:  INP   LOCATION:  2303                         FACILITY:  [facility]   PHYSICIAN:  Salvadore Farber, M.D. LHCDATE OF BIRTH:  Apr 27, 1950   DATE OF ADMISSION:  08/23/2004  DATE OF DISCHARGE:                                HISTORY & PHYSICAL   CHIEF COMPLAINT:  Chest pain.   HISTORY OF PRESENT ILLNESS:  [name] is a 60 year old man without prior  history of cardiovascular disease. At 4:30 this afternoon, he developed  throat tightness and mild chest discomfort. He presented to the [facility] Emergency Room at 6:58 p.m. There electrocardiogram showed  inferior ST elevations with anterolateral ST depressions. He was treated  with aspirin, nitroglycerin, and IV Lopressor without relief of his pain or  electrocardiographic abnormalities. He currently has ongoing discomfort at  the same level. He denies any preceding angina or heart failure symptoms.   PAST MEDICAL HISTORY:  Hypothyroidism.   MEDICATIONS:  1.  Aspirin 81 mg daily.  2.  Synthroid dose not clear.   ALLERGIES:  No known drug allergies.   SOCIAL HISTORY:  [name] is married and unemployed. He occasionally works  as a Curator. He smokes one pack per day of cigarettes. Uses alcohol  rarely.   FAMILY HISTORY:  His parents are alive and well in their 17s. His only  brother had coronary disease with onset in his 46s.   REVIEW OF SYMPTOMS:  Negative in detail except as above.   PHYSICAL EXAMINATION:  GENERAL:  He is uncomfortable appearing in no acute  distress.  VITAL SIGNS:  Heart rate 61, blood pressure 135/59. Oxygen saturation 98% on  two liters. Respiratory effort is not labored. He is afebrile.  HEENT:  Normal.  SKIN:  Normal.  NECK:  He has no jugular venous distention and no thyromegaly. He has no  cervical, supraclavicular, or axillary lymphadenopathy.  LUNGS:  Clear to  auscultation.  HEART:  Regular rate and rhythm without murmur, rub, or S3. There is an S4.  ABDOMEN:  The abdomen is soft, nondistended, and nontender. There is no  hepatosplenomegaly. Bowel sounds are normal.  EXTREMITIES:  Warm without clubbing, cyanosis, edema, or ulceration. Carotid  pulses are 2+ bilaterally without bruits. Femoral pulses are 2+ bilaterally  without bruits. DP pulses are 2+ bilaterally.   LABORATORY DATA:  Electrocardiogram demonstrates normal sinus rhythm at 66  beats per minute with inferior ST elevations of approximately 2 mm and ST  depressions anteriorly and laterally.   IMPRESSION:  A 60 year old gentleman with acute inferior myocardial  infarction. We will proceed emergently to cardiac catheterization with  percutaneous revascularization. He is administered 600 mg of Plavix in the  emergency room. Beta blocker will be continued and high dose Statin  initiated. We will check both fasting lipid profile and hemoglobin A1c.  Smoking cessation has been strongly advised.       WED/MEDQ  D:  08/23/2004  T:  08/23/2004  Job:  [id number]   cc:   [name]. [name],  M.D. [facility]

## 2010-07-14 NOTE — Cardiovascular Report (Signed)
NAME:  JACOBI, NILE NO.:  [id number]   MEDICAL RECORD NO.:  [id number]          PATIENT TYPE:  INP   LOCATION:  1825                         FACILITY:  [facility]   PHYSICIAN:  Salvadore Farber, M.D. LHCDATE OF BIRTH:  [birth year]   DATE OF PROCEDURE:  08/23/2004  DATE OF DISCHARGE:                              CARDIAC CATHETERIZATION   PROCEDURE:  Left heart catheterization, left ventriculography, coronary  angiography, HORIZON study stent to the posterior left ventricular branch,  intravascular ultrasound of the right coronary artery.   INDICATIONS:  [name] is a 60 year old gentleman with cardiac risk factors  of ongoing tobacco abuse who presents with acute inferior myocardial  infarction.  In the emergency room, he was treated with aspirin and beta  blocker and brought emergently to the cardiac catheterization laboratory.   PROCEDURAL TECHNIQUE:  Informed consent was obtained.  The patient consented  to participate in HORIZON study.  Under 1% lidocaine local anesthesia, a 6-  French sheath was placed in the right common femoral artery using the  modified Seldinger technique.  Diagnostic angiography and ventriculography  were performed using JL-4, JR-4 and pigtail catheters.  These images  demonstrated the culprit lesion to be occlusion of the large posterior left  ventricular branch of the right coronary artery.  Decision was made to  proceed to percutaneous revascularization.   Anticoagulation was initiated with 50 units/kg of heparin and ACT maintained  greater than 200 seconds. Double bolus eptifibatide was administered per  randomization in the HORIZON trial.  He was administered 600 mg of Plavix.  A 6-French JR-4 guide was advanced over a wire and engaged in the ostium of  the RCA.  A Prowater wire was advanced beyond the occlusion into the distal  posterior left ventricular branch without difficulty.  The lesion was  predilated using a 2.5 x 15 mm  Maverick at 6 atmospheres.  With reperfusion,  the patient became markedly hypotensive and bradycardic.  Blood pressure  became as low as 65 systolic and was treated with dopamine up to 20  mcg/kg/minute.  Bradycardia was treated with single amp of Atropine.  With  these measures, blood pressure improved.  It appeared that this response was  secondary to Bezold-Jarisch reflex.  It was then stented using a 2.75 x 24  mm HORIZON study stent deployed at 16 atmospheres.  The stent was then post  dilated using a 3 x 13 mm Powersail for two successive inflations, each at  18 atmospheres covering the entirety of the stent.  Intravascular ultrasound  was then performed.  This demonstrated the stent to be fully expanded and  well opposed throughout its length.  After the intravascular ultrasound,  there was a region of focal severe stenosis just at the distal stent margin.  With the administration of additional intracoronary nitroglycerin, this  resolved completely, consistent with spasm.  By the completion of the  procedure, the patient's dopamine had been weaned off and he was now  hemodynamically stable.  He was then transferred to the intensive care unit  in stable condition having tolerated the procedure well.  COMPLICATIONS:  Transient hypotension resolved at the completion of the  case.   FINDINGS:  1.  Left ventricle:  107/6/18.  EF 63% with inferior akinesis.  2.  No aortic stenosis or mitral regurgitation.  3.  Left main:  Angiographically normal.  4.  LAD:  Moderate size vessel giving rise to a single large diagonal      branch.  The proximal LAD is diffusely diseased with up to 50% stenosis.      The mid LAD has a focal 70% stenosis.  The large diagonal has a 50%      stenosis in its medial branch.  5.  Circumflex:  Moderate size vessel giving rise to a single obtuse      marginal.  This obtuse marginal has a long segment of 80% stenosis.      However, the vessel appears to be  approximately 2 mm in diameter.  6.  RCA:  Extremely large and dominant vessel.  The posterior left      ventricular branch is enormous and supplies much of the posterior wall.      The mid RCA is diffusely diseased with up to 20% stenosis.  The      posterior left ventricular branch was occluded.  This was reopened to no      residual stenosis as described above.   TIMES:  Pain onset 1630.  ER arrival 1858.  Cath lab arrival 1926.  Reperfusion 1955.   IMPRESSION AND PLAN:  Successful revascularization of the culprit lesion in  the posterior left ventricular branch of the right coronary artery.  He has  significant residual disease in his left system.  Will review further  management of this with colleagues.  Potential management options include  medical therapy versus coronary artery bypass grafting versus percutaneous  revascularization.       WED/MEDQ  D:  08/23/2004  T:  08/24/2004  Job:  [id number]   cc:   Lonzo Cloud. Kriste Basque, M.D. [facility]

## 2010-07-14 NOTE — Cardiovascular Report (Signed)
NAME:  [name], [name] NO.:  [id number]   MEDICAL RECORD NO.:  [id number]          PATIENT TYPE:  OIB   LOCATION:  NA                           FACILITY:  [facility]   PHYSICIAN:  Charlies Constable, M.D. [facility] DATE OF BIRTH:  1951-02-24   DATE OF PROCEDURE:  09/11/2005  DATE OF DISCHARGE:                              CARDIAC CATHETERIZATION   PROCEDURE:  Cardiac catheterization and IVUS study.   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  In June of 2006,  with a diaphragmatic wall infarction and underwent stenting of the large  posterolateral branch of the right coronary by Dr. Samule Ohm as part of the  Horizon protocol.  He subsequently had stenting of the mid-LAD for recurrent  ischemia in August.  He has done well since that time and returns now for a  scheduled follow-up catheterization as part of the Horizon protocol.   PROCEDURE:  The procedure was performed via the right femoral artery  utilizing an arterial sheath.  We initially used 4-French catheters for the  left coronary artery but switched to 6-French catheters.  We then performed  left ventriculography.  We then performed IVUS using a JR-4 6-French guiding  catheter with side holes, a Luge wire and Atlantis IVUS catheter.  The  patient given 50 units per kilogram of heparin following ACT greater than  200 seconds.  We passed a Luge wire down across the stent in the  posterolateral branch of the right coronary without difficulty.  We then  passed the IVUS catheter distal to the stent and did automatic pullback.  Nitroglycerin was given prior to passing the IVUS catheter.  The IVUS  catheter and wire were then removed and final diagnostic studies were then  performed through the Guidant catheter.   The right femoral was not suitable for closure due to a stick below the  bifurcation.  The patient tolerated the procedure well and left the  laboratory in satisfactory condition.   RESULTS:  The left  main coronary:  The left main coronary artery was free of  significant disease.   The left anterior descending artery:  The left anterior descending artery  was diffusely irregular and gave rise to septal perforator, a large diagonal  branch and second septal perforator.  There was 40% narrowing of the  proximal LAD before the first diagonal branch and septal perforator.  There  was 0% stenosis at the mid-LAD at the stent site.  The distal LAD was  irregular and small in caliber and there was no major obstruction.  There  was 70% stenosis in one of the subbranches of the first large diagonal  branch.   The circumflex artery gave rise to an atrial branch, a marginal branch and  posterolateral branch.  There was 70% segmental stenosis in the subbranch of  the marginal branch.   The right coronary artery:  The right coronary artery was a very large  vessel, gave rise to conus branch, a right ventricular branch, a large  posterior descending and a large and small posterolateral branch.  There was  diffuse  irregularity and ectasia in the midvessel with 50% narrowing in the  midvessel.  There was 50% narrowing in the midportion of the posterior  descending branch.  The stent in the posterolateral branch was widely patent  with less than 10% narrowing.  Just distal to the stent there was about 30%  narrowing in the native vessel.   Left ventriculogram:  Left ventriculogram performed in the ROA projection  showed good wall motion with no areas of hypokinesis.  The aortic pressure  was 102/59 with mean of 79 and left ventricular pressure was 102/4.   The IVUS run showed that the stent was well expanded with a minimal lumen  diameter of about 2.75.  In the proximal portion of the stent, it was  difficult to interpret because there was quite a bit of movement in the  vessel and I was initially concerned about some lack of apposition but on  reviewing the tapes, I think this was probably tissue  and not blood behind  the stent.   CONCLUSION:  1.  Coronary artery disease status post prior diaphragmatic wall infarction      treated with a Horizon study stent in the posterolateral and to the      right coronary and status post stenting of the mid left anterior      descending, now with follow-up catheterization and IVUS as part of the      Horizon protocol.  2.  Nonobstructive coronary artery disease with 40% narrowing of the      proximal left anterior descending, 0% stenosis at the stent site in the      mid left anterior descending, 70& stenosis in the diagonal branch left      anterior descending, 70% stenosis in the marginal branch of the      circumflex artery, 50% stenosis in the mid right coronary, 50% stenosis      in the posterior descending branch of the right coronary artery and less      than 10% stenosis at the Horizon study stent in the posterolateral      branch of the right coronary with normal left ventricular function      estimated fraction of 55%.   RECOMMENDATIONS:  Both the Horizon study stent and the Taxus stent in the  LAD are widely patent.  There is no major there obstructive coronary  disease.  Will plan continued secondary risk factor modification.           ______________________________  Charlies Constable, M.D. [facility]     BB/MEDQ  D:  09/11/2005  T:  09/11/2005  Job:  [id number]   cc:   Lonzo Cloud. Kriste Basque, M.D. [facility]  [address]  [place]  [place] [address]   Salvadore Farber, M.D. [facility]  [address]  [address]  [place]  [place] [address]

## 2010-07-14 NOTE — Assessment & Plan Note (Signed)
[facility]                              CARDIOLOGY OFFICE NOTE   NAME:[name]                      MRN:          [id number]  DATE:09/06/2005                            DOB:          [birth year]    Return office visit for lipid clinic.   PAST MEDICAL HISTORY:  Coronary artery disease, status post acute myocardial  infarction.  History of tobacco abuse, has been tobacco-free for one year.  Hyperlipidemia.  Hypothyroidism.   MEDICATIONS:  Welchol 625 mg, 3 tablets twice daily.  Zetia 5 mg daily.  Plavix 75 mg daily.  Metoprolol 25 mg twice daily.  Synthroid 0.175 mg  daily.   VITAL SIGNS:  Weight 235 pounds.  Blood pressure 128/78.  Heart rate 80.   LABORATORY DATA:  Total cholesterol 202.  Triglycerides 191.  HDL 41.  LDL  146.  LFTs within normal limits.   ASSESSMENT:  [name] is a pleasant gentleman who returns to the lipid  clinic today with no chest pain, no shortness of breath, no muscle aches or  pain.  He is compliant with his current medication regimen; however, he  recently ran out of his Welchol and instead of refilling it, increased his  Zetia from 5 mg to 10 mg daily.  He seems to be tolerating it well.  Usually  in the past, the 10 mg caused an increased amount of fatigue; however, he is  on Coenzyme Q-10 that seems to be helping him tolerate the Zetia 10 mg at  this time.  Upon discussion we decided to continue off the Welchol and on  Zetia 10 mg and will follow up at next visit for lipid panel and make any  adjustments that will be needed at that time.  He is compliant with exercise  regimen.  He walks a mile and a half, six times a week.  He, again as I  stated before, has been tobacco-free for one year which he is very proud  about.  He does admit to being at a class reunion the weekend before his  most recent labs were drawn, given an increase in his LDL from 123 in May to  146 in June.  He says that he was very  noncompliant with a heart-healthy  diet.  He ate whatever he wanted, and I wonder if part of this could have  contributed to the increase in his LDL.  He normally follows a fairly heart-  healthy diet.  He eats peanut butter and jelly each morning for breakfast.  He eats a very heart-healthy lunch.  His evening meal is low-fat, low-  carbohydrate and lots of vegetables.  Although, he does snack in the evening  time on ice cream or a bowl of cereal.  He eats fried food once a week.  Total cholesterol is above goal of less than 200, triglycerides are above  goal of less than 150, HDL is at goal of greater than 40, LDL remains  greater than goal of less than 100.   PLAN:  1.  Continue Zetia 10 mg daily along  with Coenzyme Q10.  2.  Continue exercise regimen.  3.  Continue heart-healthy diet.  4.  Follow-up lipid panel in six months after the patient has continued with      lifestyle modification      and will make any medication changes at that time.  He is aware that and      does agree that medication changes will have to occur at next visit if      his lipid panel is not at goal.                                  Leota Sauers, PharmD    LC/MedQ  DD:  09/06/2005  DT:  09/06/2005  Job #:  [id number]

## 2010-07-14 NOTE — Assessment & Plan Note (Signed)
[facility]                            CARDIOLOGY OFFICE NOTE   NAME:[name], [name]                      MRN:          [id number]  DATE:04/18/2006                            DOB:          10-Nov-1950    PRIMARY CARE PHYSICIAN:  Dr. Lonzo Cloud. [name].   HISTORY OF PRESENT ILLNESS:  [name] is a 60 year old gentleman who  suffered inferior myocardial infarction in June of 2006.  We treated  this with a Horizon study stent to the posterior left ventricular branch  of the right coronary.  He also had stenting of the mid LAD using Taxus  in August of 2006.  Follow up catheterization performed July 2007 for  the [facility], demonstrated patency of both stents and a 70%  stenosis and a marginal branch, which we are managing medically.  Ejection fraction is 55%.   [name] has generally been doing very well.  He has not had any chest  discomfort, paroxysmal nocturnal dyspnea, orthopnea, exertional dyspnea,  palpitations, syncope, or presyncope.  He has no claudication.   Unfortunately, he has been intolerant to statins.  He has been tried on  Pravachol, Lipitor, Zocor.  In addition, he has been tried on [employer]  and Zetia.  He was able to tolerate the Welchol and Zetia.  However, his  LDL dropped from only 154 to 145.  He did not feel this modest  improvement was worth the cost of the medicines.  He states he was  completely compliant with them.  He is therefore off all statins.   CURRENT MEDICATIONS:  1. Aspirin 325 mg per day.  2. Plavix 75 mg per day.  3. Multivitamin.  4. Metoprolol 25 mg twice per day.  5. Synthroid 175 mcg per day.  6. Claritin 10 mg daily p.r.n.   PHYSICAL EXAMINATION:  He is generally well appearing and no distress  with a heart rate of 58, blood pressure 126/68 and weight of 240 pounds.  He has no jugular venous distension, thyromegaly, lymphadenopathy.  Lungs:  Clear to auscultation.  He has a non displaced point of  maximum  cardiac impulse.  There is a regular rate and rhythm without murmur, rub  or gallop.  ABDOMEN:  Soft, nondistended, nontender.  There is no  hepatosplenomegaly.  Bowel sounds are normal.  EXTREMITIES:  Warm and without edema.   A electrocardiogram demonstrates sinus bradycardia at 58 beats per  minute.  Normal EKG.   IMPRESSION/RECOMMENDATIONS:  1. Atherosclerotic coronary disease with prior inferior myocardial      infarction.  He has drug eluting stents in the LAD and RCA.      Continue current medical therapy including aspirin and Plavix      indefinitely.  Continue beta blockade indefinitely due to the prior      myocardial infarction.  2. Hypercholesterolemia:  Intolerant to multiple statins, which have      tried in our lipid clinic.  Minimal benefit from the [facility] and      Zetia.  I agree with him that the trivial improvement is probably  not worth the cost of the medicines.  3. Hypertension:  Well controlled.  4. Hypothyroidism:  Per [name].   Will follow up in one year's time.     [name] Farber, MD  Electronically Signed    WED/MedQ  DD: 04/18/2006  DT: 04/18/2006  Job #: [id number]   cc:   [name] Cloud. Kriste Basque, MD

## 2010-07-14 NOTE — Discharge Summary (Signed)
NAME:  [name], [name] NO.:  [id number]   MEDICAL RECORD NO.:  [id number]          PATIENT TYPE:  OIB   LOCATION:  6527                         FACILITY:  [facility]   PHYSICIAN:  Salvadore Farber, M.D. LHCDATE OF BIRTH:  1950/08/13   DATE OF ADMISSION:  09/27/2004  DATE OF DISCHARGE:  09/28/2004                                 DISCHARGE SUMMARY   PROCEDURE:  TAXUS stenting, mid-left anterior descending artery on September 27, 2004.   REASON FOR ADMISSION:  [name] is a 60 year old male, status post recent  inferior STEMI on August 23, 2004, treated with Horizon stenting of the distal  right coronary artery, with residual disease of the obtuse marginal and left  anterior descending, who presented to the office in followup with a  complaint of recurrent pain.  He was admitted for an elective coronary  angiography and intervention.  Please refer to the dictated admission note  for full details.   LABORATORY DATA:  Hemoglobin 11.9, hematocrit 35, WBC 10.7, platelets 266.  Sodium 142, potassium 4.0, glucose 89, BUN 14, creatinine 1.1 at discharge.  CPK 149/2.8, troponin I 0.04 (post-PCI).   HOSPITAL COURSE:  The patient presented for an elective coronary angiography  performed by Dr. Salvadore Farber.  (See the report for full details.)  This revealed widely patent distal right coronary artery stent site and a  70% mid-LAD lesion, successfully treated with a TAXUS stenting to 0%  residual stenosis.  There were no noted complications.   The patient was kept overnight after his intervention and discharged on the  following morning in hemodynamically stable condition.  The right groin  incision site was stable.  Chronic inferolateral T-wave inversion noted,  with no associated chest discomfort.   DISCHARGE MEDICATIONS:  1.  The patient was discharged on the previous home medication regimen,      which includes Plavix 75 mg daily.  2.  Coated aspirin 325 mg daily.  3.   Toprol XL 25 mg b.i.d.  4.  Pravachol 40 mg daily.  5.  Synthroid 0.150 mg daily.  6.  Nitrostat 0.4 mg as directed.   DISCHARGE INSTRUCTIONS:  Refer to standardized cardiac catheterization  discharge sheet for instructions.   FOLLOWUP:  The patient will follow up with Dr. Yehuda [facility] on October 17, 2004, at 11 a.m.   DISCHARGE DIAGNOSES:  1.  Multivessel coronary artery disease      1.  Status post elective TAXUS stenting, mid-left anterior descending          artery on September 27, 2004.      2.  Status post inferior STEMI myocardial infarction/stenting (Horizon),          distal right coronary artery on August 15, 2004.      3.  Preserved left ventricular function.  2.  Hyperlipidemia.  3.  Hypertension.  4.  Hypothyroidism.  5.  Tobacco      1.  Recently discontinued.       GS/MEDQ  D:  09/28/2004  T:  09/28/2004  Job:  [id number]

## 2010-07-14 NOTE — Cardiovascular Report (Signed)
NAME:  [name], CRUEY NO.:  [id number]   MEDICAL RECORD NO.:  [id number]          PATIENT TYPE:  OIB   LOCATION:  6527                         FACILITY:  [facility]   PHYSICIAN:  Salvadore Farber, M.D. LHCDATE OF BIRTH:  03-15-1950   DATE OF PROCEDURE:  09/27/2004  DATE OF DISCHARGE:                              CARDIAC CATHETERIZATION   PROCEDURE:  Coronary angiography, drug-eluting stent placement in the mid-  LAD, intravascular ultrasound of the LAD.   INDICATIONS:  Mr. Noecker is a 60 year old gentleman who suffered inferior  myocardial infarction on August 23, 2004. I placed a Horizon study stent in  the posterior left ventricular branch. At that time, he was noted to have at  least moderate stenosis of the proximal LAD and a severe stenosis of the mid-  LAD.  Since discharge he has had some mild chest discomfort occurring both  with and without exertion. He returns today for repeat angiography to better  assess the proximal LAD and possible percutaneous intervention.   PROCEDURE TECHNIQUE:  Informed consent was obtained. Under 1% lidocaine  local anesthesia, a 6-French sheath was placed in the right common femoral  artery using the modified Seldinger technique. Diagnostic angiography was  performed in the RCA using 5-French JR-4 catheter. Diagnostic angiography of  the left system was then performed using 6-French CLS-3.5 guide. This  confirmed that the stenosis previously seen in the proximal LAD was only  moderate (approximately 50%). After the administration of intracoronary  nitroglycerin, the circumflex obtuse marginal remained off small. The lesion  of the mid-LAD was 70-80%. Decision was therefore made to proceed to  percutaneous intervention on the mid-LAD leaving the moderate disease of  proximal LAD and the small circumflex to be treated medically.   Anticoagulation was initiated with bivalirudin. ACT was confirmed to be  greater than 225 seconds. A  Prowater wire was advanced to the distal LAD  without difficulty. The lesion was directly stented using a 2.75 x 12 mm  Taxus deployed at 16 atmospheres. It was then postdilated using a 3.0 x 12  mm Quantum at 18 atmospheres. Repeat angiography demonstrated a mild  haziness at the proximal stent margin. Therefore, intravascular ultrasound  was performed. This demonstrated that the stent was fully expanded and well  apposed. There was no evidence of dissection. There was again moderate  disease of the proximal LAD with circumferential calcium much more  impressive by intravascular ultrasound then it was by angiogram. Wire and  IVUS catheter were then removed. Final angiography demonstrated no residual  stenosis and TIMI III flow to the distal vasculature.   COMPLICATIONS:  None.   FINDINGS:  1.  Left main: Angiographically normal.  2.  LAD: Moderate-sized vessel giving rise to single diagonal. There is a      50% stenosis of the proximal vessel with heavy calcification      demonstrated by intravascular ultrasound. The 70% stenosis of the mid      vessel was treated with drug-eluting stent with excellent result. The      diagonal has a 50% stenosis distally.  3.  Circumflex: Moderate-sized vessel giving rise to a single branching      obtuse marginal. This vessel has an 80% stenosis but is approximately 2      mm in diameter after the administration of nitroglycerin.  4.  RCA: Very large, dominant vessel. The previously placed stent in the PLV      is widely patent. There is 20% stenosis of the midvessel and 20%      stenosis proximal to the stent and the PLV.   IMPRESSION/RECOMMENDATIONS:  Successful drug-eluting stent placement in the  left anterior descending artery. We will continue current medical therapy  with intent for medical management of the circumflex disease.       WED/MEDQ  D:  09/27/2004  T:  09/27/2004  Job:  [id number]   cc:   Lonzo Cloud. Kriste Basque, M.D. [facility]

## 2010-07-14 NOTE — Assessment & Plan Note (Signed)
[facility]                              CARDIOLOGY OFFICE NOTE   NAME:[name], [name]                      MRN:          [id number]  DATE:10/01/2005                            DOB:          1950-11-10    SUBJECTIVE:  [name] is a 60 year old male patient followed by Dr. Samule [name]  with a history of inferior ST elevation myocardial infarction.  June of  2006, treated with Horizon study stent to the posterior left ventricular  branch as well as a TAXUS stent to the mid LAD in August 2006.  As part of  the Horizon study, he recently underwent cardiac catheterization for follow  up.  This was done by Dr. Juanda [name] on July 17.  This showed moderate  nonobstructive disease in both his Horizon studies and the TAXUS stent were  both patent.  The patient returns today for follow up.  He denies any chest  pain or shortness of breath.   CURRENT MEDICATIONS:  1.  Plavix 75 mg daily.  2.  Aspirin 325 mg daily.  3.  Multivitamin.  4.  Toprol 50 mg half tablet b.i.d.  5.  Zetia 10 mg daily.  6.  Synthroid 175 mcg daily.  7.  Nitroglycerin p.r.n.   PHYSICAL EXAMINATION:  GENERAL:  He is a well-nourished, well-developed male  in no acute distress.  VITAL SIGNS:  Blood pressure 120/72, pulse 57, weight 236 pounds.  CARDIAC:  Normal S1, S2.  Regular rate and rhythm.  LUNGS:  Clear to auscultation bilaterally.  ABDOMEN:  Soft, nontender.  EXTREMITIES:  Without edema.  RECTAL:  Without hematoma's or bruits.   STUDIES:  Electrocardiogram reveals sinus rhythm with heart rate of 57.  Non-  specific ST-T wave changes.  No significant change from previous tracings.   IMPRESSION:  1.  Coronary artery disease today status post inferior ST elevation      myocardial infarction, June 2006, treated with Horizon study stent to      the posterior left ventricular branch.      1.  Status post TAXUS stenting to the LAD August 2006.      2.  Recent rebook catheterization:  A  40% proximal LAD, 0% at stent site          mid LAD, 70% stenosis in the diagonal branch, 70% in the marginal          branch of the circumflex, 50% in the mid RCA, 50% in the posterior          descending branch, and less than 10% in the Horizon study stent and          the posterolateral branch of the RCA.  2.  Good LV function, EF 55%.  3.  Treated dyslipidemia.  Intolerant to Statin.  4.  Treated hypertension.  5.  Treated hypothyroidism.   PLAN:  The patient is doing well from a Cardiovascular standpoint.  He will  continue with his medications as listed above and follow up with Dr. Samule [name]  in October as scheduled.  He will also continue in our lipid  clinic for  management of his cholesterol.                                  Tereso Newcomer, PA-C                           [name], M.D., F.A.C.C.   SW/MedQ  DD:  10/01/2005  DT:  10/01/2005  Job #:  [id number]   cc:   [name]. [name] Basque, MD

## 2010-07-14 NOTE — Discharge Summary (Signed)
NAME:  [name] NO.:  [id number]   MEDICAL RECORD NO.:  [id number]          PATIENT TYPE:  INP   LOCATION:  3734                         FACILITY:  [facility]   PHYSICIAN:  Joellyn Rued, P.A. [facility] DATE OF BIRTH:  02/05/1951   DATE OF ADMISSION:  08/23/2004  DATE OF DISCHARGE:  08/26/2004                           DISCHARGE SUMMARY - REFERRING   ADMITTING PHYSICIAN:  Salvadore Farber, MD, [facility].   DISCHARGING PHYSICIAN:  Charlton Haws, MD.   SUMMARY OF HISTORY:  [name] is a 60 year old male, who presented on the  afternoon of the 28th with throat tightness and mild chest discomfort.  EKG  showed inferior ST segment elevation with anterolateral ST segment  depression.  Despite being treated with aspirin, nitroglycerin, IV Lopressor  without relief, he proceeded to the cardiac catheterization lab.  History is  notable for hypothyroidism and tobacco use.   LABORATORY DATA:  Admission H&H was 15.1 and 44.5, normal indices, platelets  298, WBCs 8.8.  Sodium 140, potassium 4.1, BUN 13, creatinine 1.1, glucose  105.  Alkaline phosphatase was slightly low at 30.  Subsequent CBCs were  essentially unremarkable except for a slight rise in his WBCs.  By the 30th,  H&H was 12.5 and 36.7, normal indices, platelets 230, WBCs 11.6.  Subsequent  chemistries were unremarkable.  Last chemistry on the 30th showed a sodium  of 138, potassium 3.7, BUN 12, creatinine 1.1, glucose 106.  Hemoglobin A1c  was 6.1.  Initial CK total was 370 with an MB of 6.2, relative index 1.7,  and troponin of 0.01.  CK total rose to 1829 with an MB of 205.2, relative  index of 11.2, troponin of 36.8, and subsequent enzymes were declining.  Fasting lipids on the 29th showed a total cholesterol of 185, triglycerides  116, HDL 43, LDL 119.  TSH was noted to be elevated at 14.360.  Recheck ALT  was 13 on the 27th.  EKGs subsequent to admission showed normal sinus rhythm  with a rate of 63, evolving  inferoposterior myocardial infarction.   HOSPITAL COURSE:  [name] was taken emergently to the cardiac  catheterization laboratory by Dr. Samule Ohm.  According to his progress note,  his EF was 63%, inferior akinesis.  He had 100% distal RCA, which underwent  Horizon stenting.  It was noted that he has residual disease in the  circumflex branches and the LAD system.  During the catheterization, he  became hypotensive, requiring dopamine.  Dopamine was able to be  discontinued with his improved blood pressure.  By June 29th, he was doing  much better.  Tobacco cessation consult was performed.  Telemetry had shown  some nonsustained ventricular tachycardia, which was felt to be related to  reperfusion.  He was started on Lipitor for his hyperlipidemia.  With his  elevated TSH, his Synthroid was increased to 150 mcg.  Dr. Samule Ohm reviewed  future plans with Dr. Riley Kill in regards to his LAD and intervention will be  considered in approximately three weeks at which time followup will be  scheduled.  Cardiac rehab assisted with education and  ambulation, and by  July 1st, Dr. Eden Emms felt that the patient could be discharged home.   DISCHARGE DIAGNOSES:  1.  Acute inferoposterior myocardial infarction, status post stenting as      previously described.  Residual coronary artery disease.  2.  Hypertension.  3.  Tobacco use.  4.  Hyperlipidemia.  5.  Hypothyroidism.  6.  History as previously.   DISPOSITION:  It was noted that the patient was placed in the Horizon's  Study, drug-eluting stent versus bare metal stent.  He is to return for re-  cath and IVUS in approximately 13 months with Dr. Samule Ohm.  This will be in  around of August 2007.  Research would pay for the hospitalization and  usually scheduled in the [facility] lab.  They will call him to arrange a followup  appointment and answer any questions that he may have.   MEDICATIONS AT TIME OF DISCHARGE:  1.  Synthroid; it was increased to 0.15  mg daily.  New medications include:  1.  Plavix 75 mg daily.  2.  Lipitor 80 mg p.o. q.h.s.  3.  Toprol-XL 100 mg p.o. daily.  4.  Nitroglycerin 0.4 as needed.  5.  Aspirin 325 mg daily.  6.  Multivitamin daily.   SPECIAL INSTRUCTIONS:  He was asked not to work until seen by the physician,  maintain a low-salt, fat, and cholesterol diet, he received discharge  instructions in regards to activities and catheterization site.  He was  asked to call on Monday after 10 a.m. to arrange a three-week appointment  with Dr. Samule Ohm.  At that time, consideration will be given to possible  intervention on the LAD or circumflex system.  At the time of followup with  Dr. Samule Ohm,  PA and lateral chest x-ray should be considered in that it was  not performed under the emergent circumstances at [facility].  The  patient will arrange a followup appointment with Dr. Kriste Basque in regards to  further evaluation of his hypothyroidism.  He will need fasting lipids and  LFTs checked in approximately six to eight weeks since Lipitor was  initiated.  He will also need a repeat TSH per Dr. Jodelle Green discretion since  his Synthroid was increased.       EW/MEDQ  D:  08/26/2004  T:  08/26/2004  Job:  [id number]   cc:   Salvadore Farber, M.D. [facility]  [address]  [address]  [place]  [place] [address]   Lonzo Cloud. Kriste Basque, M.D. [facility]

## 2010-08-18 ENCOUNTER — Encounter: Payer: Self-pay | Admitting: Pulmonary Disease

## 2010-08-21 ENCOUNTER — Ambulatory Visit (INDEPENDENT_AMBULATORY_CARE_PROVIDER_SITE_OTHER): Payer: 59 | Admitting: Pulmonary Disease

## 2010-08-21 ENCOUNTER — Encounter: Payer: Self-pay | Admitting: Pulmonary Disease

## 2010-08-21 DIAGNOSIS — I10 Essential (primary) hypertension: Secondary | ICD-10-CM

## 2010-08-21 DIAGNOSIS — Z0000 Encounter for general adult medical examination without abnormal findings: Secondary | ICD-10-CM

## 2010-08-21 DIAGNOSIS — I2510 Atherosclerotic heart disease of native coronary artery without angina pectoris: Secondary | ICD-10-CM

## 2010-08-21 DIAGNOSIS — E785 Hyperlipidemia, unspecified: Secondary | ICD-10-CM

## 2010-08-21 DIAGNOSIS — E039 Hypothyroidism, unspecified: Secondary | ICD-10-CM

## 2010-08-21 DIAGNOSIS — M1990 Unspecified osteoarthritis, unspecified site: Secondary | ICD-10-CM

## 2010-08-21 DIAGNOSIS — R7989 Other specified abnormal findings of blood chemistry: Secondary | ICD-10-CM

## 2010-08-21 DIAGNOSIS — J309 Allergic rhinitis, unspecified: Secondary | ICD-10-CM

## 2010-08-21 DIAGNOSIS — G4733 Obstructive sleep apnea (adult) (pediatric): Secondary | ICD-10-CM

## 2010-08-21 DIAGNOSIS — E663 Overweight: Secondary | ICD-10-CM

## 2010-08-21 MED ORDER — LOSARTAN POTASSIUM 100 MG PO TABS: 100.0000 mg | ORAL_TABLET | Freq: Every day | ORAL | Status: DC

## 2010-08-21 MED ORDER — METOPROLOL SUCCINATE ER 50 MG PO TB24: 50.0000 mg | ORAL_TABLET | Freq: Every day | ORAL | Status: DC

## 2010-08-21 MED ORDER — CYCLOBENZAPRINE HCL 10 MG PO TABS: 10.0000 mg | ORAL_TABLET | Freq: Three times a day (TID) | ORAL | Status: DC | PRN

## 2010-08-21 MED ORDER — LEVOTHYROXINE SODIUM 175 MCG PO TABS: 175.0000 ug | ORAL_TABLET | Freq: Every day | ORAL | Status: DC

## 2010-08-21 NOTE — Patient Instructions (Signed)
Today we updated your med list in our EPIC system...    We wrote a new prescription for LOSARTAN 100mg  - take one tab daily for BP...    Please continue to monitor your BP at home...  Please return to our lab one morning this week for your follow up fasting blood work...    Please call the [facility]REE in a few days for your results...    Dial [id number] & when prompted enter your patient number followed by the # symbol...    Your patient number is:  [id number]#  Let's get on track w/ our diet & exercise program...  Call for any questions & let me know if your BP does not improve to 140/80 or better.Marland KitchenMarland Kitchen

## 2010-08-21 NOTE — Progress Notes (Signed)
Subjective:    Patient ID: [name], male    DOB: Jun 09, 1950, 60 y.o.   MRN: [id number]  HPI 60 y/o WM here for a follow up visit... he has multiple medical problems as noted below...  ~  Feb09:  he had some left shoulder pain and saw DrSypher w/ arthroscopy and rotator cuff repair... he is doing much better now... he feels well and denies any new complaints or concerns...  ~  August 03, 2008:  here for yearly follow up and has mult questions:  otherwise doing satis on CPAP nightly, BP controlled, no CP & neg Myoview 1/10, still refuses Chol meds...  He notes rash on right leg/ truck/ back- c/w chigger bites/ recent tick exposure... no mites seen, no systemic reaction, local reaction only w/ itching- Rx ice, Benedryl, topical (lotrisone)...      >>bilat foot pain- first thing in AM or after rest, better after up & about... c/w plantar fasciitis- Rx soaks, stretching, antiinflamm Rx, consider podiatrist.  ~  August 03, 2009:  he states he had attack of gout & took his father's med w/ resolution> we checked Uric= 8.0 and rec starting ALLOPURINOL 300mg /d for prevention...  he has OSA on CPAP per DrClance w/ f/u 10/10- stable on Rx... also saw DrKatz for Cards 2/11- doing satis & EKG= WNL, he reamins on ASA, Plavix, Toprol... he has proved intol at all lipid lowering meds and does the best he can on diet alone... he has repeatedly refused routine screening colonoscopy & vaccinations...  ~  August 21, 2010:  Yearly ROV & his BP has been sl elevated on his home checks> pretty consistently in the 150s/ 80s he says on his ToprolXL monotherapy;  Not really on a diet & wt is up 12# to 270# by our scales;  He saw Delton See 2/12 & pt reports that Delton See stopped his Plavix Rx but was concerned about his BP;  We decided to add LOSARTAN 100mg /d & get back on track w/ diet & exercise therapy to reduce weight...  He will return for FastingLabs (LDL 174- still refuses all meds; FBS 125- new DM doesn't want meds thwerefore  must get wt down, f/u 73mo) & check stool cards as well (still refuses colonoscopy)... See prob list below>>   Problem List:   OBSTRUCTIVE SLEEP APNEA (ICD-327.23) - prev ROS was pos for snoring and symptoms suggesting signif OSA... he denied daytime hypersomnolence, but he noted decreased O2 sats at night in the hosp post op!... Sleep Study 2/09 showed AHI= 15, RDI= 16, Desat to 77%, +snoring... started on CPAP 12 by DrClance & using it nightly w/ good response, tolerating well, wife pleased... ~  f/u DrClance 10/10- stable on CPAP, continue same. ~  f/u DrClance 11/11- remains stable on CPAP nightly doing well  ASTHMATIC BRONCHITIS, ACUTE (ICD-466.0) - ex-smoker quit in 2006 w/ his MI... no recent exac and he denies cough, sputum, hemoptysis, worsening dyspnea,  wheezing, chest pains, etc... he is on no regular medication, but uses MUCINEX as needed... ~  CXR 6/11 showed clear lungs, WNL...   HYPERTENSION (ICD-401.9) - on TOPROL XL 50mg /d... not following any sort of diet and weight is up to 270#... not restricting sodium... BP=136/84 today, but higher at home averaging 150s/80s> pt denies HA, fatigue, visual changes, CP, palipit, dizziness, syncope, dyspnea, edema, etc... we have repeatedly discussed diet rx and salt restriction;  Delton See was concerned about his BP & it has not come down, therefore we decided  to add LOSARTAN 100mg /d;  He will continue to monitor BP at home...  ARTERIOSCLEROTIC HEART DISEASE (ICD-414.00) - on ASA 325mg /d & now off Plavix since 2/12... followed DrKatz and his notes are reviewed... he quit smoking 2006, unfortunately he refuses therapy for his hypercholesterolemia...  ~  he had IWMI 6/06 w/ RCA PTCA & stent... ~  last cath= 7/07 w/ non-obstructive dis in 3 vessels... ~  Myoview 1/10 showed diaph attenuation without ischemia or infarct, EF= 64%...  HYPERLIPIDEMIA (ICD-272.4) - pt states intolerant to all statins, and lipid clinic tried Zetia and Welcol- all of which  he couldn't tolerate... on diet alone & not doing a very good job ("I'm not paying much attention to my diet")  He refuses to reconsider statin meds. ~  FLP 1/08 showed TChol 225, TG 164, HDL 49, LDL 154 ~  FLP 2/09 showed TChol 238, TG 149, HDL 48, LDL 167 ~  FLP 6/10 showed TChol 241, TGT 143, HDL 52, LDL 179 ~  FLP 6/11 showed TChol 217, TG 201, HDL 48, LDL 151 ~  FLP 6/12 showed TChol 236, TG 148, HDL 58, LDL 174... rec to start Statin, pt refuses all chol meds, will continue "diet" management.  ELEVATED FASTING BLOOD SUGAR>> new prob 6/12 w/ FBS= 125 & we discussed DM & the need for weight reduction, low carb diet, etc... In lieu of wt reduction we could start Metformin monotherapy but he doesn't want meds & prefers diet alone...  HYPOTHYROIDISM (ICD-244.9) - stable on SYNTHROID 165mcg/d... ~  labs 1/08 showed TSH= 0.41 ~  labs 2/09 showed TSH= 0.39 ~  labs 6/10 showed TSH= 1.31 ~  labs 6/11 showed TSH= 0.44 ~  Labs 6/12 showed TSH= 0.11 & he doesn't want to decr dose...  DEGENERATIVE JOINT DISEASE (ICD-715.90) - he had right shoulder surg by DrSypher 2008.  HYPERURICEMIA (ICD-790.6) - he had ?gout attack by his hx & took his father's gout Rx (?what) w/ improvement... ~  labs 6/11 showed Uric = 8.0.Marland KitchenMarland Kitchen rec to start ALLOPURINOL 300mg /d ==> he took it for 70mo & stopped on his own preferring to watch & wait...  SURG/OTH PROC NOT CARRIED OUT BECAUSE PTS DECN (ICD-V64.2) - pt has repeatedly refused colonoscopy and we discussed this today- he refuses again and doesn't want appt to talk to Gi or consider alternatives to screening eg. virtual colonoscopy etc...   Past Surgical History  Procedure Date  . Shoulder surgery     Outpatient Encounter Prescriptions as of 08/21/2010  Medication Sig Dispense Refill  . aspirin 325 MG tablet Take 325 mg by mouth daily.        . cyclobenzaprine (FLEXERIL) 10 MG tablet Take 1 tablet (10 mg total) by mouth 3 (three) times daily as needed.  90 tablet  5    . metoprolol (TOPROL-XL) 50 MG 24 hr tablet Take 1 tablet (50 mg total) by mouth daily.  30 tablet  11  . Multiple Vitamin (MULTIVITAMIN) capsule Take 1 capsule by mouth daily.        . nitroGLYCERIN (NITROSTAT) 0.4 MG SL tablet Place 0.4 mg under the tongue every 5 (five) minutes as needed.        Marland Kitchen levothyroxine (SYNTHROID) 175 MCG tablet Take 1 tablet (175 mcg total) by mouth daily.  30 tablet  11  . losartan (COZAAR) 100 MG tablet Take 1 tablet (100 mg total) by mouth daily.  30 tablet  11    Allergies  Allergen Reactions  . Ibuprofen  REACTION: intol to large amounts of ibuprofen    Current Medications, Allergies, Past Medical History, Past Surgical History, Family History, and Social History were reviewed in [employer] record.   Review of Systems    The patient denies fever, chills, sweats, anorexia, fatigue, weakness, malaise, weight loss, sleep disorder, blurring, diplopia, eye irritation, eye discharge, vision loss, eye pain, photophobia, earache, ear discharge, tinnitus, decreased hearing, nasal congestion, nosebleeds, sore throat, hoarseness, chest pain, palpitations, syncope, orthopnea, PND, peripheral edema, cough, dyspnea at rest, excessive sputum, hemoptysis, wheezing, pleurisy, nausea, vomiting, diarrhea, constipation, change in bowel habits, abdominal pain, melena, hematochezia, jaundice, gas/bloating, indigestion/heartburn, dysphagia, odynophagia, dysuria, hematuria, urinary frequency, urinary hesitancy, nocturia, incontinence, back pain, joint pain, joint swelling, muscle cramps, muscle weakness, stiffness, arthritis, sciatica, restless legs, leg pain at night, leg pain with exertion, rash, itching, dryness, suspicious lesions, paralysis, paresthesias, seizures, tremors, vertigo, transient blindness, frequent falls, frequent headaches, difficulty walking, depression, anxiety, memory loss, confusion, cold intolerance, heat intolerance, polydipsia,  polyphagia, polyuria, unusual weight change, abnormal bruising, bleeding, enlarged lymph nodes, urticaria, allergic rash, hay fever, and recurrent infections.     Objective:   Physical Exam     WD, WN, 60 y/o WM in NAD GENERAL:  Alert & oriented; pleasant & cooperative... HEENT:  [place]/AT, EOM-wnl, PERRLA, EACs-clear, TMs-wnl, NOSE-clear, THROAT-clear & wnl. NECK:  Supple w/ fairROM; no JVD; normal carotid impulses w/o bruits; no thyromegaly or nodules palpated; no lymphadenopathy. CHEST:  Clear to P & A; without wheezes/ rales/ or rhonchi. HEART:  Regular Rhythm; without murmurs/ rubs/ or gallops heard... ABDOMEN:  Soft & nontender; normal bowel sounds; no organomegaly or masses detected... EXT: without deformities, mild arthritic changes; no varicose veins/ venous insuffic/ or edema. NEURO:  CN's intact; motor testing normal; sensory testing normal; gait normal & balance OK. DERM:  no lesions seen...   Assessment & Plan:   OSA>  Followed by DrClance; stable on CPAP & uses it compliantly; he really likes this Rx & it has had a big impact...  AB>  No resp problems this past yr; doing well & hasn't even needed Mucinex...  HBP>  BP has remained elev at home & my recheck here= 150/86; therefore rec starting LOSARTAN 100mg  added to his MetoprololER 50mg /d; he will continue to monitor at home & let me know if BP not ~140/80 or better...  CAD>  Followed by Delton See on ASA, BBlocker, Diet Rx for chol (see below); he needs to be more active & get wt down; Plavix was stopped 4 months ago by Cards.  HYPERLIPIDEMIA>  He is INTOL to all Chol/ Lipid meds & refuses to reconsider/ lipid clinic/ etc; on diet alone but not really on diet; he was prev [facility] by [facility]; we reviewed low chol/ low fat diet & need to lose weight...  HYPOTHYROID>  Stable on Synthroid 130mcg/d, he wants to continue same dose...  DJD/ Hyperuricemia>  Off Allopurinol in 2011 on his own; no recurrence of gout 7 he prefers to use  OTC anti-inflamm Rx as needed...  He has repeatedly & consisently refused to consider screening colonoscopy; offered appt w/ GI to discuss & offered alternative screening procedures but he is not interested & continues to decline eval..Marland Kitchen

## 2010-08-25 ENCOUNTER — Other Ambulatory Visit: Payer: Self-pay | Admitting: Pulmonary Disease

## 2010-08-25 ENCOUNTER — Other Ambulatory Visit (INDEPENDENT_AMBULATORY_CARE_PROVIDER_SITE_OTHER): Payer: 59

## 2010-08-25 DIAGNOSIS — Z0000 Encounter for general adult medical examination without abnormal findings: Secondary | ICD-10-CM

## 2010-08-25 LAB — HEPATIC FUNCTION PANEL
AST: 21 U/L (ref 0–37)
Albumin: 4.6 g/dL (ref 3.5–5.2)
Total Bilirubin: 0.8 mg/dL (ref 0.3–1.2)

## 2010-08-25 LAB — CBC WITH DIFFERENTIAL/PLATELET
Basophils Absolute: 0 10*3/uL (ref 0.0–0.1)
Basophils Relative: 0.5 % (ref 0.0–3.0)
Eosinophils Absolute: 0.4 10*3/uL (ref 0.0–0.7)
HCT: 41.8 % (ref 39.0–52.0)
Hemoglobin: 14.4 g/dL (ref 13.0–17.0)
Lymphocytes Relative: 32.6 % (ref 12.0–46.0)
MCV: 88.2 fl (ref 78.0–100.0)
Monocytes Absolute: 0.8 10*3/uL (ref 0.1–1.0)
Neutro Abs: 3.8 10*3/uL (ref 1.4–7.7)
Neutrophils Relative %: 50.7 % (ref 43.0–77.0)

## 2010-08-25 LAB — LIPID PANEL
Cholesterol: 236 mg/dL — ABNORMAL HIGH (ref 0–200)
HDL: 57.7 mg/dL (ref >39.00)
Total CHOL/HDL Ratio: 4
VLDL: 29.6 mg/dL (ref 0.0–40.0)

## 2010-08-25 LAB — LDL CHOLESTEROL, DIRECT: Direct LDL: 174.4 mg/dL

## 2010-08-25 LAB — BASIC METABOLIC PANEL
BUN: 21 mg/dL (ref 6–23)
CO2: 27 mEq/L (ref 19–32)
Chloride: 103 mEq/L (ref 96–112)

## 2010-08-25 LAB — PSA: PSA: 0.33 ng/mL (ref 0.10–4.00)

## 2010-09-19 ENCOUNTER — Other Ambulatory Visit: Payer: 59

## 2010-09-19 ENCOUNTER — Other Ambulatory Visit: Payer: Self-pay | Admitting: Pulmonary Disease

## 2010-09-19 DIAGNOSIS — Z0000 Encounter for general adult medical examination without abnormal findings: Secondary | ICD-10-CM

## 2010-09-19 LAB — HEMOCCULT SLIDES (X 3 CARDS)
Fecal Occult Blood: NEGATIVE
OCCULT 2: POSITIVE
OCCULT 3: NEGATIVE
OCCULT 4: NEGATIVE

## 2010-09-20 ENCOUNTER — Telehealth: Payer: Self-pay | Admitting: Pulmonary Disease

## 2010-09-21 NOTE — Telephone Encounter (Signed)
Pt is aware of lab results.

## 2010-12-11 LAB — BASIC METABOLIC PANEL
BUN: 18
Chloride: 102
GFR calc Af Amer: >60
GFR calc non Af Amer: >60

## 2010-12-29 ENCOUNTER — Encounter: Payer: Self-pay | Admitting: Pulmonary Disease

## 2010-12-29 ENCOUNTER — Ambulatory Visit (INDEPENDENT_AMBULATORY_CARE_PROVIDER_SITE_OTHER): Payer: 59 | Admitting: Pulmonary Disease

## 2010-12-29 VITALS — BP 140/78 | HR 104 | Temp 98.0°F | Ht 72.5 in | Wt 272.2 lb

## 2010-12-29 DIAGNOSIS — G4733 Obstructive sleep apnea (adult) (pediatric): Secondary | ICD-10-CM

## 2010-12-29 NOTE — Assessment & Plan Note (Signed)
The patient seems to be doing well from a sleep apnea standpoint.  He is wearing CPAP compliantly, and is satisfied with his sleep and daytime alertness.  I have asked him to work aggressively on weight loss, and to followup with me in one year.  He is to also keep up with his supplies and mask changes.

## 2010-12-29 NOTE — Progress Notes (Signed)
  Subjective:    Patient ID: [name], male    DOB: 11-Aug-1950, 60 y.o.   MRN: [id number]  HPI The patient comes in today for followup of his obstructive sleep apnea.  He is wearing CPAP compliantly, and reports no issues with mask fit or pressure.  He is sleeping well, and feels satisfied with his daytime alertness.  His weight is up about 11 pounds from last visit.   Review of Systems  Constitutional: Negative for fever and unexpected weight change.  HENT: Negative for ear pain, nosebleeds, congestion, sore throat, rhinorrhea, sneezing, trouble swallowing, dental problem, postnasal drip and sinus pressure.   Eyes: Negative for redness and itching.  Respiratory: Negative for cough, chest tightness, shortness of breath and wheezing.   Cardiovascular: Negative for palpitations and leg swelling.  Gastrointestinal: Negative for nausea and vomiting.  Genitourinary: Negative for dysuria.  Musculoskeletal: Negative for joint swelling.  Skin: Negative for rash.  Neurological: Negative for headaches.  Hematological: Does not bruise/bleed easily.  Psychiatric/Behavioral: Negative for dysphoric mood. The patient is not nervous/anxious.        Objective:   Physical Exam Overweight male in no acute distress No skin breakdown or pressure necrosis from the CPAP mask Lower extremities without edema, no cyanosis noted Alert, does not appear to be sleepy, moves all 4 extremities.       Assessment & Plan:

## 2010-12-29 NOTE — Patient Instructions (Signed)
Continue with cpap, and keep up with supplies and mask changes. Work on weight loss followup with me in one year if doing well.  

## 2011-01-19 ENCOUNTER — Telehealth: Payer: Self-pay | Admitting: Pulmonary Disease

## 2011-01-19 MED ORDER — AZITHROMYCIN 250 MG PO TABS: ORAL_TABLET | ORAL | Status: AC

## 2011-01-19 NOTE — Telephone Encounter (Signed)
Z-pak  MUcinex OTC Call back next week if no better

## 2011-01-19 NOTE — Telephone Encounter (Signed)
Rx sent pt aware.Carron Curie, CMA

## 2011-01-19 NOTE — Telephone Encounter (Signed)
Spoke with the pt and he is c/o having head congestion, chest congestion and productive cough with white phlegm. He also states he is having a sharp pain behind his right shoulder blade as well. He denies any SOB, wheezing or chest tightness. He states he he also had body aches and chills yesterday. I advised with the mucus being white and no other symptoms I would rec OTC cold medication and cough syrup. Pt states he has tried these without relief. Please advise. Carron Curie, CMA Allergies  Allergen Reactions  . Ibuprofen     REACTION: intol to large amounts of ibuprofen

## 2011-02-28 ENCOUNTER — Telehealth: Payer: Self-pay | Admitting: Pulmonary Disease

## 2011-02-28 MED ORDER — CEPHALEXIN 500 MG PO CAPS: ORAL_CAPSULE | ORAL | Status: DC

## 2011-02-28 NOTE — Telephone Encounter (Signed)
lmomtcb x1 

## 2011-02-28 NOTE — Telephone Encounter (Signed)
Per SN--doubt that abx would be helpful but if he wants abx then call in   Keflex 500mg   #28  1 po qid until gone.  thanks

## 2011-02-28 NOTE — Telephone Encounter (Signed)
See SN recs in phone note

## 2011-02-28 NOTE — Telephone Encounter (Signed)
Pt retuning call [phone] [name]

## 2011-02-28 NOTE — Telephone Encounter (Signed)
C/o right ankle is red, swollen, warm to touch, "red dots" x 1 week. Rash yesterday and is less visible now. Has iced, taking Aleve, and gout medication no better. Does not want appointment and is requesting abx. Denies fever and bruising. Please advise, thank you.  Allergies  Allergen Reactions  . Ibuprofen     REACTION: intol to large amounts of ibuprofen   [facility] [place]

## 2011-02-28 NOTE — Telephone Encounter (Signed)
I spoke with pt and is aware of SN recs. He voiced his understanding and rx was called into pharmacy. Nothing further was needed

## 2011-02-28 NOTE — Telephone Encounter (Signed)
Spoke with pt. He states mis stepped while walking outside on 12/26 and thinks that he tore something in his left ankle- same day as injury ankle had "small red spot" and was better the next day, but since then has become red again and is painful to walk on. He states that he is willing to come in for ov, but there is now nothing available. Please advise, thanks!

## 2011-06-29 ENCOUNTER — Encounter: Payer: Self-pay | Admitting: Cardiology

## 2011-06-29 DIAGNOSIS — R9430 Abnormal result of cardiovascular function study, unspecified: Secondary | ICD-10-CM | POA: Insufficient documentation

## 2011-06-29 DIAGNOSIS — I2510 Atherosclerotic heart disease of native coronary artery without angina pectoris: Secondary | ICD-10-CM | POA: Insufficient documentation

## 2011-07-02 ENCOUNTER — Ambulatory Visit (INDEPENDENT_AMBULATORY_CARE_PROVIDER_SITE_OTHER): Payer: 59 | Admitting: Cardiology

## 2011-07-02 ENCOUNTER — Encounter: Payer: Self-pay | Admitting: Cardiology

## 2011-07-02 VITALS — BP 150/82 | HR 66 | Ht 72.0 in | Wt 265.0 lb

## 2011-07-02 DIAGNOSIS — I10 Essential (primary) hypertension: Secondary | ICD-10-CM

## 2011-07-02 DIAGNOSIS — I2510 Atherosclerotic heart disease of native coronary artery without angina pectoris: Secondary | ICD-10-CM

## 2011-07-02 DIAGNOSIS — E785 Hyperlipidemia, unspecified: Secondary | ICD-10-CM

## 2011-07-02 DIAGNOSIS — E663 Overweight: Secondary | ICD-10-CM

## 2011-07-02 NOTE — Assessment & Plan Note (Signed)
Unfortunately the patient is statin intolerant. Can not give him any of the statins.

## 2011-07-02 NOTE — Assessment & Plan Note (Signed)
Blood pressure is elevated today. He will begin to take it at home and call in values. I've chosen not to change his meds yet. Hopefully his blood pressure will come down over time as his weight improved also.

## 2011-07-02 NOTE — Patient Instructions (Signed)
Your physician wants you to follow-up in: 1 year.   You will receive a reminder letter in the mail two months in advance. If you don't receive a letter, please call our office to schedule the follow-up appointment.  Please check your blood pressure 2-3 times per week and call Debby Medora Roorda (Dr Henrietta Hoover nurse) with the results the week of 07/23/11 at [phone].

## 2011-07-02 NOTE — Progress Notes (Signed)
HPI The patient has coronary disease and is here for her yearly followup. I actually saw him last in February, 2012. He had coronary stents placed in 2006 on 2 separate occasions. Catheterization in 2007 showed that the stents were patent. Nuclear stress test in 2010 revealed no ischemia. He has normal left ventricular function. He is active. He's not having any chest pain or shortness of breath. There's been no syncope or presyncope.  As part of today's evaluation I have carefully reviewed all of the prior cardiac history and updated completely the new electronic medical record  Allergies  Allergen Reactions  . Ibuprofen     REACTION: intol to large amounts of ibuprofen    Current Outpatient Prescriptions  Medication Sig Dispense Refill  . aspirin 325 MG tablet Take 325 mg by mouth daily.        . cyclobenzaprine (FLEXERIL) 10 MG tablet Take 1 tablet (10 mg total) by mouth 3 (three) times daily as needed.  90 tablet  5  . levothyroxine (SYNTHROID) 175 MCG tablet Take 1 tablet (175 mcg total) by mouth daily.  30 tablet  11  . losartan (COZAAR) 100 MG tablet Take 1 tablet (100 mg total) by mouth daily.  30 tablet  11  . metoprolol (TOPROL-XL) 50 MG 24 hr tablet Take 1 tablet (50 mg total) by mouth daily.  30 tablet  11  . Multiple Vitamin (MULTIVITAMIN) capsule Take 1 capsule by mouth daily.        . nitroGLYCERIN (NITROSTAT) 0.4 MG SL tablet Place 0.4 mg under the tongue every 5 (five) minutes as needed.        . cephALEXin (KEFLEX) 500 MG capsule Take 1 tablet 4 times a day until gone  28 capsule  0    History   Social History  . Marital Status: Married    Spouse Name: N/A    Number of Children: N/A  . Years of Education: N/A   Occupational History  . Not on file.   Social History Main Topics  . Smoking status: Former Smoker -- 1.0 packs/day for 15 years    Types: Cigarettes    Quit date: 08/23/2004  . Smokeless tobacco: Former Neurosurgeon    Types: Chew   Comment: quit the  same day  . Alcohol Use: Yes     social use  . Drug Use: No  . Sexually Active: Not on file   Other Topics Concern  . Not on file   Social History Narrative  . No narrative on file    Family History  Problem Relation Age of Onset  . Heart disease Brother     Past Medical History  Diagnosis Date  . CAD (coronary artery disease)     DES 07/2004 /   DES LAD 09/2004  /  cath 08/2005 stents patent  /  nuclear 02/2008 no ischemia  . Overweight   . Allergic rhinitis, cause unspecified   . Obstructive sleep apnea (adult) (pediatric)   . Acute bronchitis   . Hypertension   . Dyslipidemia   . Hypothyroidism   . Osteoarthrosis, unspecified whether generalized or localized, unspecified site   . Other abnormal blood chemistry   . Ejection fraction     EF 55% cath, 08/2005  /  EF 60% nuclear, 02/2008    Past Surgical History  Procedure Date  . Shoulder surgery     ROS   Patient denies fever, chills, headache, sweats, rash, change in vision, change in hearing,  chest pain, cough, nausea vomiting, urinary symptoms. All other systems are reviewed and are negative.  PHYSICAL EXAM  Patient is oriented to person time and place. Affect is normal. He is overweight. He has lost some weight recently but is still heavier than prior visits. He says he is committed to losing weight. There is no jugulovenous distention. Lungs are clear. Respiratory effort is nonlabored. Cardiac exam is S1 and S2. There no clicks or significant murmurs. The abdomen is soft. There is no peripheral edema. There no musculoskeletal deformities. There are no skin rashes.  Filed Vitals:   07/02/11 1500  BP: 150/82  Pulse: 66  Height: 6' (1.829 m)  Weight: 265 lb (120.203 kg)   EKG is done today and reviewed by me. EKG is normal. There is no significant change.  ASSESSMENT & PLAN

## 2011-07-02 NOTE — Assessment & Plan Note (Signed)
Coronary disease is stable. He is not in any further testing at this time. I'll see him back in one year.

## 2011-07-02 NOTE — Assessment & Plan Note (Signed)
The patient is beginning to lose some weight and seems committed.

## 2011-08-10 ENCOUNTER — Telehealth: Payer: Self-pay | Admitting: Cardiology

## 2011-08-10 NOTE — Telephone Encounter (Signed)
Pt rtn call to debby lefler 

## 2011-08-10 NOTE — Telephone Encounter (Signed)
These blood pressures are okay for now, continue to follow

## 2011-08-10 NOTE — Telephone Encounter (Signed)
Pt calling with his bp readings.  They are as follows: 5/16 5pm=127/82 5/21 5pm=147/88 5/30 2am=128/68 6/5   7pm=130/78 6/6   2am=170/92  [name] states he takes his Metoprolol 50mg  and Losartan 100mg  around 11:00am.

## 2011-08-13 NOTE — Telephone Encounter (Signed)
N/A.  LMTC. 

## 2011-08-14 NOTE — Telephone Encounter (Signed)
Mrs Voiles was notified.  Mr Uttech was unavailable.

## 2011-08-15 ENCOUNTER — Other Ambulatory Visit: Payer: Self-pay | Admitting: Pulmonary Disease

## 2011-08-27 ENCOUNTER — Encounter: Payer: Self-pay | Admitting: Pulmonary Disease

## 2011-08-27 ENCOUNTER — Other Ambulatory Visit (INDEPENDENT_AMBULATORY_CARE_PROVIDER_SITE_OTHER): Payer: 59

## 2011-08-27 ENCOUNTER — Ambulatory Visit (INDEPENDENT_AMBULATORY_CARE_PROVIDER_SITE_OTHER)
Admission: RE | Admit: 2011-08-27 | Discharge: 2011-08-27 | Disposition: A | Discharge status: Home / Self Care | Payer: 59 | Source: Ambulatory Visit | Attending: Pulmonary Disease | Admitting: Pulmonary Disease

## 2011-08-27 ENCOUNTER — Ambulatory Visit (INDEPENDENT_AMBULATORY_CARE_PROVIDER_SITE_OTHER): Payer: 59 | Admitting: Pulmonary Disease

## 2011-08-27 VITALS — BP 138/80 | HR 84 | Temp 96.9°F | Ht 73.0 in | Wt 268.0 lb

## 2011-08-27 DIAGNOSIS — R7989 Other specified abnormal findings of blood chemistry: Secondary | ICD-10-CM

## 2011-08-27 DIAGNOSIS — R7301 Impaired fasting glucose: Secondary | ICD-10-CM

## 2011-08-27 DIAGNOSIS — M1990 Unspecified osteoarthritis, unspecified site: Secondary | ICD-10-CM

## 2011-08-27 DIAGNOSIS — N320 Bladder-neck obstruction: Secondary | ICD-10-CM

## 2011-08-27 DIAGNOSIS — G4733 Obstructive sleep apnea (adult) (pediatric): Secondary | ICD-10-CM

## 2011-08-27 DIAGNOSIS — N139 Obstructive and reflux uropathy, unspecified: Secondary | ICD-10-CM

## 2011-08-27 DIAGNOSIS — R5383 Other fatigue: Secondary | ICD-10-CM

## 2011-08-27 DIAGNOSIS — I10 Essential (primary) hypertension: Secondary | ICD-10-CM

## 2011-08-27 DIAGNOSIS — I2510 Atherosclerotic heart disease of native coronary artery without angina pectoris: Secondary | ICD-10-CM

## 2011-08-27 DIAGNOSIS — E785 Hyperlipidemia, unspecified: Secondary | ICD-10-CM

## 2011-08-27 DIAGNOSIS — R5381 Other malaise: Secondary | ICD-10-CM

## 2011-08-27 DIAGNOSIS — E291 Testicular hypofunction: Secondary | ICD-10-CM

## 2011-08-27 DIAGNOSIS — E039 Hypothyroidism, unspecified: Secondary | ICD-10-CM

## 2011-08-27 DIAGNOSIS — E119 Type 2 diabetes mellitus without complications: Secondary | ICD-10-CM

## 2011-08-27 LAB — CBC WITH DIFFERENTIAL/PLATELET
Basophils Absolute: 0.1 10*3/uL (ref 0.0–0.1)
Basophils Relative: 0.7 % (ref 0.0–3.0)
Eosinophils Relative: 5.7 % — ABNORMAL HIGH (ref 0.0–5.0)
HCT: 41.2 % (ref 39.0–52.0)
Hemoglobin: 13.7 g/dL (ref 13.0–17.0)
Lymphocytes Relative: 31.1 % (ref 12.0–46.0)
Lymphs Abs: 2.4 10*3/uL (ref 0.7–4.0)
Monocytes Relative: 8.7 % (ref 3.0–12.0)
Neutro Abs: 4.1 10*3/uL (ref 1.4–7.7)
RBC: 4.63 Mil/uL (ref 4.22–5.81)
WBC: 7.7 10*3/uL (ref 4.5–10.5)

## 2011-08-27 LAB — BASIC METABOLIC PANEL
CO2: 27 mEq/L (ref 19–32)
Calcium: 9.5 mg/dL (ref 8.4–10.5)
Creatinine, Ser: 0.9 mg/dL (ref 0.4–1.5)
GFR: 93.75 mL/min (ref >60.00)
Glucose, Bld: 112 mg/dL — ABNORMAL HIGH (ref 70–99)
Sodium: 139 mEq/L (ref 135–145)

## 2011-08-27 LAB — LIPID PANEL
Cholesterol: 235 mg/dL — ABNORMAL HIGH (ref 0–200)
HDL: 53.3 mg/dL (ref >39.00)
Triglycerides: 234 mg/dL — ABNORMAL HIGH (ref 0.0–149.0)

## 2011-08-27 LAB — HEPATIC FUNCTION PANEL
Albumin: 4.2 g/dL (ref 3.5–5.2)
Alkaline Phosphatase: 29 U/L — ABNORMAL LOW (ref 39–117)
Total Protein: 7.3 g/dL (ref 6.0–8.3)

## 2011-08-27 LAB — PSA: PSA: 0.53 ng/mL (ref 0.10–4.00)

## 2011-08-27 LAB — URIC ACID: Uric Acid, Serum: 7.9 mg/dL — ABNORMAL HIGH (ref 4.0–7.8)

## 2011-08-27 LAB — HEMOGLOBIN A1C: Hgb A1c MFr Bld: 7.5 % — ABNORMAL HIGH (ref 4.6–6.5)

## 2011-08-27 MED ORDER — LEVOTHYROXINE SODIUM 175 MCG PO TABS: 175.0000 ug | ORAL_TABLET | Freq: Every day | ORAL | Status: DC

## 2011-08-27 MED ORDER — METOPROLOL SUCCINATE ER 50 MG PO TB24: 50.0000 mg | ORAL_TABLET | Freq: Every day | ORAL | Status: DC

## 2011-08-27 MED ORDER — LOSARTAN POTASSIUM 100 MG PO TABS: 100.0000 mg | ORAL_TABLET | Freq: Every day | ORAL | Status: DC

## 2011-08-27 NOTE — Patient Instructions (Addendum)
Today we updated your med list in our EPIC system...    Continue your current medications the same...    We refilled your meds per request...  Today we did your follow up CXR & Fasting blood work...    We will call you w/ the results when avail...  For your hands:    Try the Lac-hydrin lotion at night under an occlusive latex or rubber glove...    If this is not effective our next option will be Derm consult for their suggestions...  Call for any questions.Marland KitchenMarland Kitchen

## 2011-08-27 NOTE — Progress Notes (Signed)
Subjective:    Patient ID: [name], male    DOB: 1950/12/16, 61 y.o.   MRN: [id number]  HPI 61 y/o WM here for a follow up visit... he has multiple medical problems as noted below...  ~  Feb09:  he had some left shoulder pain and saw DrSypher w/ arthroscopy and rotator cuff repair... he is doing much better now... he feels well and denies any new complaints or concerns...  ~  August 03, 2008:  here for yearly follow up and has mult questions:  otherwise doing satis on CPAP nightly, BP controlled, no CP & neg Myoview 1/10, still refuses Chol meds...  He notes rash on right leg/ truck/ back- c/w chigger bites/ recent tick exposure... no mites seen, no systemic reaction, local reaction only w/ itching- Rx ice, Benedryl, topical (lotrisone)...      >>bilat foot pain- first thing in AM or after rest, better after up & about... c/w plantar fasciitis- Rx soaks, stretching, antiinflamm Rx, consider podiatrist.  ~  August 03, 2009:  he states he had attack of gout & took his father's med w/ resolution> we checked Uric= 8.0 and rec starting ALLOPURINOL 300mg /d for prevention...  he has OSA on CPAP per DrClance w/ f/u 10/10- stable on Rx... also saw DrKatz for Cards 2/11- doing satis & EKG= WNL, he reamins on ASA, Plavix, Toprol... he has proved intol at all lipid lowering meds and does the best he can on diet alone... he has repeatedly refused routine screening colonoscopy & vaccinations...  ~  August 21, 2010:  Yearly ROV & his BP has been sl elevated on his home checks> pretty consistently in the 150s/ 80s he says on his ToprolXL monotherapy;  Not really on a diet & wt is up 12# to 270# by our scales;  He saw [name] 2/12 & pt reports that [name] stopped his Plavix Rx but was concerned about his BP;  We decided to add LOSARTAN 100mg /d & get back on track w/ diet & exercise therapy to reduce weight...  He will return for FastingLabs (LDL 174- still refuses all meds; FBS 125- new DM doesn't want meds thwerefore  must get wt down, f/u 26mo) & check stool cards as well (still refuses colonoscopy)... [name] prob list below>>  ~  August 27, 2011:  1 year ROV & [name] is c/o lack of energy, some weakness, slow decline he says> we discussed checking full labs including potassium, CBC, Thyroid & Testosterone ([name] results below)...  He is also c/o dry skin on hand w/ some cracking & occas bleeds> we discussed moisturing regimen w/ Lac-hydrin lotion & latex gloves Qhs, avoid water exposure (use playtex gloves etc), if no better then refer to derm...    He saw DrKatz for Cards 5/13> CAD w/ stents placed 2006; Myoview 2010 w/o ischemia; denies CP, palpit, etc & he is active; BP was borderline but they didn't change meds, asked to get on diet, get wt down, he has proven intol to all statins...    We reviewed prob list, meds, xrays and labs> [name] below>> EKG 5/13 showed NSR, rate66, WNL, NAD... CXR 7/13 showed normal heart size, clear lungs, NAD... LABS 7/13:  FLP- not at goals on diet alone;  Chems- on x BS=112 A1c=7.5;  CBC- wnl;  TSH=0.12 on Levo175;  PSA=0.53;  Testos=189;  Uric=7.9   Problem List:   OBSTRUCTIVE SLEEP APNEA (ICD-327.23) - prev ROS was pos for snoring and symptoms suggesting signif OSA... he denied  daytime hypersomnolence, but he noted decreased O2 sats at night in the hosp post op!... Sleep Study 2/09 showed AHI= 15, RDI= 16, Desat to 77%, +snoring... started on CPAP 12 by DrClance & using it nightly w/ good response, tolerating well, wife pleased... ~  f/u DrClance 10/10- stable on CPAP, continue same. ~  f/u DrClance 11/11- remains stable on CPAP nightly doing well ~  7/13:  He states using CPAP compliantly, resting well, wakes refreshed, no issues w/ daytime alertness...  ASTHMATIC BRONCHITIS, ACUTE (ICD-466.0) - ex-smoker quit in 2006 w/ his MI... no recent exac and he denies cough, sputum, hemoptysis, worsening dyspnea,  wheezing, chest pains, etc... he is on no regular medication, but uses MUCINEX as  needed... ~  CXR 6/11 showed clear lungs, WNL.Marland Kitchen.  ~  CXR 7/13 showed normal heart size, clear lungs, NAD...  HYPERTENSION (ICD-401.9) - on TOPROL XL 50mg /d & LOSARTAN 100mg /d... not following any sort of diet and weight is still around 270#... not restricting sodium... ~  6/12:  BP=136/84 on Metoprolol50 & higher at home averaging 150s/80s> pt denies HA, fatigue, visual changes, CP, palipit, dizziness, syncope, dyspnea, edema, etc; we have repeatedly discussed diet rx and salt restriction;  [name] was concerned about his BP & it has not come down, therefore we decided to add LOSARTAN 100mg /d;  he will continue to monitor BP at home... ~  7/13:  BP=138/80 on BBlocker & ARB> denies CP, palpit, dizzy, ch in SOB, edema, etc; we reviewed diet, exercise, wt reduction strategies...  ARTERIOSCLEROTIC HEART DISEASE (ICD-414.00) - on ASA 325mg /d & now off Plavix since 2/12... followed DrKatz and his notes are reviewed... he quit smoking 2006, unfortunately he refuses therapy for his hypercholesterolemia...  ~  he had IWMI 6/06 w/ RCA PTCA & stent... ~  last cath= 7/07 w/ non-obstructive dis in 3 vessels... ~  Myoview 1/10 showed diaph attenuation without ischemia or infarct, EF= 64%... ~  Yearly f/u DrKatz for Cards & his notes are reviewed...  HYPERLIPIDEMIA (ICD-272.4) - pt states intolerant to all statins, and lipid clinic tried Zetia and Welcol- all of which he couldn't tolerate... on diet alone & not doing a very good job ("I'm not paying much attention to my diet")  He refuses to reconsider statin meds. ~  FLP 1/08 showed TChol 225, TG 164, HDL 49, LDL 154 ~  FLP 2/09 showed TChol 238, TG 149, HDL 48, LDL 167 ~  FLP 6/10 showed TChol 241, TGT 143, HDL 52, LDL 179 ~  FLP 6/11 showed TChol 217, TG 201, HDL 48, LDL 151 ~  FLP 6/12 showed TChol 236, TG 148, HDL 58, LDL 174... rec to start Statin, pt refuses all chol meds, will continue "diet" management. ~  FLP 7/13 on diet alone showed TChol 235, TG  234, HDL 53, LDL 139... Still refuses med trial, we discussed low chol, low fat diet...  DIABETES MELLITUS >> ELEVATED FASTING BLOOD SUGAR >> new prob 6/12 w/ FBS= 125 & we discussed DM & the need for weight reduction, low carb diet, etc... In lieu of wt reduction we could start Metformin monotherapy but he doesn't want meds & prefers diet alone... ~  7/13:  Labs showed BS= 112, A1c= 7.5.Marland KitchenMarland Kitchen rec to start METFORMIN 500mg  Qam & get on low carb diet, get wt down...  HYPOTHYROIDISM (ICD-244.9) - stable on SYNTHROID 125mcg/d... ~  labs 1/08 showed TSH= 0.41 ~  labs 2/09 showed TSH= 0.39 ~  labs 6/10 showed TSH= 1.31 ~  labs  6/11 showed TSH= 0.44 ~  Labs 6/12 on Levo175 showed TSH= 0.11 & he doesn't want to decr dose... ~  Labs 7/13 on Levo175 showed TSH= 0.12 & rec to decrease SYNTHROID to 172mcg/d.  HYPOGONADISM >> pt presented w/ poor energy, weakness, declining libido, etc;  Exam showed sl atrophic right testicle;  Testos level= 189 and he is rec to start ANDROGEL 1.62%- 2 pumps rubbed into the skin daily w/ ROV & recheck labs 30mo...  DEGENERATIVE JOINT DISEASE (ICD-715.90) - he had right shoulder surg by DrSypher 2008.  HYPERURICEMIA (ICD-790.6) - he had ?gout attack by his hx & took his father's gout Rx (?what) w/ improvement... ~  labs 6/11 showed Uric = 8.0.Marland KitchenMarland Kitchen rec to start ALLOPURINOL 300mg /d ==> he took it for 57mo & stopped on his own preferring to watch & wait... ~  Labs 7/13 showed Uric= 7.9.Marland KitchenMarland Kitchen He prefers not to restart Allopurinol rx...  SURG/OTH PROC NOT CARRIED OUT BECAUSE PTS DECN (ICD-V64.2) - pt has repeatedly refused colonoscopy and we discussed this today- he refuses again and doesn't want appt to talk to Gi or consider alternatives to screening eg. virtual colonoscopy etc... ~  6/12:  Stool cards returned 2/6 pos for occult blood> he is referred to GI for further eval & colonoscopy (Note- pt refused to sched GI appt & continues to refuse colon check). ~  7/13:  Rectal exam neg &  stool brown, heme neg; asked to repeat stool cards annually...   Past Surgical History  Procedure Date  . Shoulder surgery     Outpatient Encounter Prescriptions as of 08/27/2011  Medication Sig Dispense Refill  . aspirin 325 MG tablet Take 325 mg by mouth daily.        . cyclobenzaprine (FLEXERIL) 10 MG tablet Take 1 tablet (10 mg total) by mouth 3 (three) times daily as needed.  90 tablet  5  . levothyroxine (SYNTHROID, LEVOTHROID) 175 MCG tablet TAKE 1 TABLET BY MOUTH ONCE DAILY  30 tablet  0  . losartan (COZAAR) 100 MG tablet TAKE 1 TABLET BY MOUTH ONCE DAILY  30 tablet  0  . metoprolol succinate (TOPROL-XL) 50 MG 24 hr tablet TAKE 1 TABLET BY MOUTH ONCE DAILY  30 tablet  0  . Multiple Vitamin (MULTIVITAMIN) capsule Take 1 capsule by mouth daily.        . nitroGLYCERIN (NITROSTAT) 0.4 MG SL tablet Place 0.4 mg under the tongue every 5 (five) minutes as needed.        Marland Kitchen DISCONTD: cephALEXin (KEFLEX) 500 MG capsule Take 1 tablet 4 times a day until gone  28 capsule  0    Allergies  Allergen Reactions  . Ibuprofen     REACTION: intol to large amounts of ibuprofen    Current Medications, Allergies, Past Medical History, Past Surgical History, Family History, and Social History were reviewed in [employer] record.   Review of Systems    The patient denies fever, chills, sweats, anorexia, fatigue, weakness, malaise, weight loss, sleep disorder, blurring, diplopia, eye irritation, eye discharge, vision loss, eye pain, photophobia, earache, ear discharge, tinnitus, decreased hearing, nasal congestion, nosebleeds, sore throat, hoarseness, chest pain, palpitations, syncope, orthopnea, PND, peripheral edema, cough, dyspnea at rest, excessive sputum, hemoptysis, wheezing, pleurisy, nausea, vomiting, diarrhea, constipation, change in bowel habits, abdominal pain, melena, hematochezia, jaundice, gas/bloating, indigestion/heartburn, dysphagia, odynophagia, dysuria, hematuria,  urinary frequency, urinary hesitancy, nocturia, incontinence, back pain, joint pain, joint swelling, muscle cramps, muscle weakness, stiffness, arthritis, sciatica, restless legs, leg  pain at night, leg pain with exertion, rash, itching, dryness, suspicious lesions, paralysis, paresthesias, seizures, tremors, vertigo, transient blindness, frequent falls, frequent headaches, difficulty walking, depression, anxiety, memory loss, confusion, cold intolerance, heat intolerance, polydipsia, polyphagia, polyuria, unusual weight change, abnormal bruising, bleeding, enlarged lymph nodes, urticaria, allergic rash, hay fever, and recurrent infections.     Objective:   Physical Exam     WD, WN, 61 y/o WM in NAD GENERAL:  Alert & oriented; pleasant & cooperative... HEENT:  [place]/AT, EOM-wnl, PERRLA, EACs-clear, TMs-wnl, NOSE-clear, THROAT-clear & wnl. NECK:  Supple w/ fairROM; no JVD; normal carotid impulses w/o bruits; no thyromegaly or nodules palpated; no lymphadenopathy. CHEST:  Clear to P & A; without wheezes/ rales/ or rhonchi. HEART:  Regular Rhythm; without murmurs/ rubs/ or gallops heard... ABDOMEN:  Soft & nontender; normal bowel sounds; no organomegaly or masses detected... RECTAL:  Atrophic right testicle, normal left; Rectal neg- stool heme neg; Prostate 3+ smooth... EXT: without deformities, mild arthritic changes; no varicose veins/ venous insuffic/ or edema. NEURO:  CN's intact; motor testing normal; sensory testing normal; gait normal & balance OK. DERM:  no lesions seen...  RADIOLOGY DATA:  Reviewed in the EPIC EMR & discussed w/ the patient...  LABORATORY DATA:  Reviewed in the EPIC EMR & discussed w/ the patient...   Assessment & Plan:   OSA>  Followed by DrClance; stable on CPAP & uses it compliantly; he really likes this Rx & it has had a big impact...  AB>  No resp problems this past yr; doing well & hasn't even needed Mucinex...  HBP>  BP 138/80 today on BBlocker & ARB Rx; he  needs better diet & wt reduction; ok to continue same meds for now...  CAD>  Followed by [name] on ASA, BBlocker, Diet Rx for Chol ([name] below); he needs to be more active & get wt down...  HYPERLIPIDEMIA>  FLP is not at goals; he is INTOL to all Chol/ Lipid meds & refuses to reconsider/ lipid clinic/ etc; on diet alone but not really on diet; he was prev counselled by the [facility]; we reviewed low chol/ low fat diet & need to lose weight...  DM>  Hx elev fasting glucose & advised diet rx but now A1c= 7.5 & he needs to start METFORMIN 500mg Qam + diet diet diet & get wt down!  HYPOTHYROID>  On Synthroid 173mcg/d, but oversuppressed for 2nd yr in a row & needs sl lower dose> rec decr to 182mcg/d...  HYPOGONADISM>  His weakness/ lack of energy can be explained by Low-T= 189; he has atrophic right testicle, rec Rx w/ ANDROGEL 1.62%- 2pumps daily...  DJD/ Hyperuricemia>  Off Allopurinol in 2011 on his own; no recurrence of gout & he prefers to use OTC anti-inflamm Rx as needed; Uric= 7.9  He has repeatedly & consisently refused to consider screening colonoscopy; offered appt w/ GI to discuss & offered alternative screening procedures but he is not interested & continues to decline eval... NOTE: 2/6 stool cards pos 6/12 & we begged him to go to GI for colonoscopy & he still refuses...   Patient's Medications  New Prescriptions   He will start on METFORMIN 500mg  Qam and ANDROGEL 1.62%- 2 pumps rubbed in Qd  Previous Medications   ASPIRIN 325 MG TABLET    Take 325 mg by mouth daily.     CYCLOBENZAPRINE (FLEXERIL) 10 MG TABLET    Take 1 tablet (10 mg total) by mouth 3 (three) times daily as needed.   MULTIPLE  VITAMIN (MULTIVITAMIN) CAPSULE    Take 1 capsule by mouth daily.     NITROGLYCERIN (NITROSTAT) 0.4 MG SL TABLET    Place 0.4 mg under the tongue every 5 (five) minutes as needed.    Modified Medications   Modified Medication Previous Medication   L EVOTHYROXINE (SYNTHROID, LEVOTHROID) 150 MCG TABLET  levothyroxine (SYNTHROID, LEVOTHROID) 175 MCG tablet      Take 1 tablet (150 mcg total) by mouth daily.    TAKE 1 TABLET BY MOUTH ONCE DAILY   LOSARTAN (COZAAR) 100 MG TABLET losartan (COZAAR) 100 MG tablet      Take 1 tablet (100 mg total) by mouth daily.    TAKE 1 TABLET BY MOUTH ONCE DAILY   METOPROLOL SUCCINATE (TOPROL-XL) 50 MG 24 HR TABLET metoprolol succinate (TOPROL-XL) 50 MG 24 hr tablet      Take 1 tablet (50 mg total) by mouth daily. Take with or immediately following a meal.    TAKE 1 TABLET BY MOUTH ONCE DAILY  Discontinued Medications   CEPHALEXIN (KEFLEX) 500 MG CAPSULE    Take 1 tablet 4 times a day until gone

## 2011-08-28 ENCOUNTER — Telehealth: Payer: Self-pay | Admitting: Pulmonary Disease

## 2011-08-28 DIAGNOSIS — E119 Type 2 diabetes mellitus without complications: Secondary | ICD-10-CM | POA: Insufficient documentation

## 2011-08-28 DIAGNOSIS — E291 Testicular hypofunction: Secondary | ICD-10-CM | POA: Insufficient documentation

## 2011-08-28 MED ORDER — TESTOSTERONE 20.25 MG/1.25GM (1.62%) TD GEL: 2.0000 "application " | Freq: Every day | TRANSDERMAL | Status: DC

## 2011-08-28 MED ORDER — METFORMIN HCL 500 MG PO TABS: 500.0000 mg | ORAL_TABLET | Freq: Every day | ORAL | Status: DC

## 2011-08-28 MED ORDER — LEVOTHYROXINE SODIUM 150 MCG PO TABS: 150.0000 ug | ORAL_TABLET | Freq: Every day | ORAL | Status: DC

## 2011-08-28 NOTE — Telephone Encounter (Signed)
Called and spoke with pt and he is aware of lab results per SN.  meds have been sent in to the pharmacy.  Pt is aware.  See results note.

## 2011-10-30 ENCOUNTER — Telehealth: Payer: Self-pay | Admitting: Pulmonary Disease

## 2011-10-30 MED ORDER — AMOXICILLIN-POT CLAVULANATE 875-125 MG PO TABS: 1.0000 | ORAL_TABLET | Freq: Two times a day (BID) | ORAL | Status: AC

## 2011-10-30 NOTE — Telephone Encounter (Signed)
I spoke with spouse and is aware of SN. Recs. She voiced her understanding. I have sent rx to the pharmacy. Nothing further was needed

## 2011-10-30 NOTE — Telephone Encounter (Signed)
I spoke with spouse and stated pt is having yellow discharge from eyes, eyes are puffy, runny nose w/ yellow to green discharge, dry cough, PND, sinus congestion, HA, pressure x 1 week. He has been taking zyrtec d. Spouse is requesting to have abx called in for pt. Please advise Dr. Kriste Basque thanks  Allergies  Allergen Reactions  . Ibuprofen     REACTION: intol to large amounts of ibuprofen

## 2011-10-30 NOTE — Telephone Encounter (Signed)
Per SN---call in augmentin 875 mg  #20  1 po bid , use otc nasal saline spray every hour while awake, and take tylenol cold and sinus is ok to take. thanks

## 2011-11-21 ENCOUNTER — Encounter: Payer: Self-pay | Admitting: Pulmonary Disease

## 2011-11-21 ENCOUNTER — Ambulatory Visit (INDEPENDENT_AMBULATORY_CARE_PROVIDER_SITE_OTHER): Payer: 59 | Admitting: Pulmonary Disease

## 2011-11-21 ENCOUNTER — Other Ambulatory Visit (INDEPENDENT_AMBULATORY_CARE_PROVIDER_SITE_OTHER): Payer: 59

## 2011-11-21 VITALS — BP 128/82 | HR 81 | Temp 97.4°F | Ht 73.0 in | Wt 271.2 lb

## 2011-11-21 DIAGNOSIS — E039 Hypothyroidism, unspecified: Secondary | ICD-10-CM

## 2011-11-21 DIAGNOSIS — E291 Testicular hypofunction: Secondary | ICD-10-CM

## 2011-11-21 DIAGNOSIS — M1990 Unspecified osteoarthritis, unspecified site: Secondary | ICD-10-CM

## 2011-11-21 DIAGNOSIS — E785 Hyperlipidemia, unspecified: Secondary | ICD-10-CM

## 2011-11-21 DIAGNOSIS — I10 Essential (primary) hypertension: Secondary | ICD-10-CM

## 2011-11-21 DIAGNOSIS — E119 Type 2 diabetes mellitus without complications: Secondary | ICD-10-CM

## 2011-11-21 DIAGNOSIS — J069 Acute upper respiratory infection, unspecified: Secondary | ICD-10-CM

## 2011-11-21 DIAGNOSIS — I2510 Atherosclerotic heart disease of native coronary artery without angina pectoris: Secondary | ICD-10-CM

## 2011-11-21 LAB — TSH: TSH: 0.69 u[IU]/mL (ref 0.35–5.50)

## 2011-11-21 LAB — BASIC METABOLIC PANEL
BUN: 20 mg/dL (ref 6–23)
Calcium: 9.4 mg/dL (ref 8.4–10.5)
Creatinine, Ser: 1.1 mg/dL (ref 0.4–1.5)
GFR: 70.92 mL/min (ref >60.00)
Potassium: 4.2 mEq/L (ref 3.5–5.1)

## 2011-11-21 MED ORDER — FIRST-DUKES MOUTHWASH MT SUSP: OROMUCOSAL | Status: DC

## 2011-11-21 MED ORDER — FLUTICASONE PROPIONATE 50 MCG/ACT NA SUSP: 2.0000 | Freq: Every day | NASAL | Status: DC

## 2011-11-21 NOTE — Progress Notes (Signed)
Subjective:    Patient ID: [name], male    DOB: 01/23/1951, 61 y.o.   MRN: [id number]  HPI 61 y/o WM here for a follow up visit... he has multiple medical problems as noted below...  ~  August 21, 2010:  Yearly ROV & his BP has been sl elevated on his home checks> pretty consistently in the 150s/ 80s he says on his ToprolXL monotherapy;  Not really on a diet & wt is up 12# to 270# by our scales;  He saw [name] 2/12 & pt reports that [name] stopped his Plavix Rx but was concerned about his BP;  We decided to add LOSARTAN 100mg /d & get back on track w/ diet & exercise therapy to reduce weight...  He will return for FastingLabs (LDL 174- still refuses all meds; FBS 125- new DM doesn't want meds thwerefore must get wt down, f/u 35mo) & check stool cards as well (still refuses colonoscopy)... [name] prob list below>>  ~  August 27, 2011:  1 year ROV & Connar is c/o lack of energy, some weakness, slow decline he says> we discussed checking full labs including potassium, CBC, Thyroid & Testosterone ([name] results below)...  He is also c/o dry skin on hand w/ some cracking & occas bleeds> we discussed moisturing regimen w/ Lac-hydrin lotion & latex gloves Qhs, avoid water exposure (use playtex gloves etc), if no better then refer to derm...    He saw [name] for Cards 5/13> CAD w/ stents placed 2006; Myoview 2010 w/o ischemia; denies CP, palpit, etc & he is active; BP was borderline but they didn't change meds, asked to get on diet, get wt down, he has proven intol to all statins...    We reviewed prob list, meds, xrays and labs> [name] below>> EKG 5/13 showed NSR, rate66, WNL, NAD... CXR 7/13 showed normal heart size, clear lungs, NAD... LABS 7/13:  FLP- not at goals on diet alone;  Chems- on x BS=112 A1c=7.5;  CBC- wnl;  TSH=0.12 on Levo175;  PSA=0.53;  Testos=189;  Uric=7.9  ~  November 21, 2011:  12mo ROV and [name] has 4 issues to discuss>    Recent URI w/ PND & sore throat> we called in Augmentin 9/3 &  symptoms much improved, just lingering post nasal drip & intermitt sore throat;  We discussed Rx w/ Saline during the day, Flonase- 2sp in each nostril Qhs, and MMW as needed... He notes prev reflux symptoms are much improved...    When seen last 7/13 he had BS=112 A1c=7.5 and discussed low carb diet, wt reduction and start Metformin 500mg  Qam; doing well by report, not checking BS at home, and due for f/u labs today...    Similarly his thyroid was over suppressed on Synthroid176mcg/d; we decreased to 166mcg/d 7 f/u labs due now...    Finally his labs showed Low-T at 189 & he was fatigued etc; we started Androgel 1.62%- 2 pumps daily and he reports feeling better overall, more energy, more drive, just no stamina & we discussed the importance of exercise; due for recheck Testos level on the Androgel 1.62%...    We reviewed prob list, meds, xrays and labs> [name] below for updates >> he refuses flu vaccine... LABS 9/13:  Chems- ok x BS=140 A1c=7.7;  TSH=0.69 on Levothy150...   Problem List:   OBSTRUCTIVE SLEEP APNEA (ICD-327.23) - prev ROS was pos for snoring and symptoms suggesting signif OSA... he denied daytime hypersomnolence, but he noted decreased O2 sats at night in the  hosp post op!... Sleep Study 2/09 showed AHI= 15, RDI= 16, Desat to 77%, +snoring... started on CPAP 12 by [name] & using it nightly w/ good response, tolerating well, wife pleased... ~  f/u [name] 10/10- stable on CPAP, continue same. ~  f/u [name] 11/11- remains stable on CPAP nightly doing well ~  7/13:  He states using CPAP compliantly, resting well, wakes refreshed, no issues w/ daytime alertness...  ASTHMATIC BRONCHITIS, ACUTE (ICD-466.0) - ex-smoker quit in 2006 w/ his MI... no recent exac and he denies cough, sputum, hemoptysis, worsening dyspnea,  wheezing, chest pains, etc... he is on no regular medication, but uses MUCINEX as needed... ~  CXR 6/11 showed clear lungs, WNL.Marland Kitchen.  ~  CXR 7/13 showed normal heart size,  clear lungs, NAD... ~  9/13:  He called w/ URI/ sinus infection & given Augmentin by phone; improved but lingering PND & sore throat- treated w/ Saline, Flonase, MMW...  HYPERTENSION (ICD-401.9) - on TOPROL XL 50mg /d & LOSARTAN 100mg /d... not following any sort of diet and weight is still around 270#... not restricting sodium... ~  6/12:  BP=136/84 on Metoprolol50 & higher at home averaging 150s/80s> pt denies HA, fatigue, visual changes, CP, palipit, dizziness, syncope, dyspnea, edema, etc; we have repeatedly discussed diet rx and salt restriction;  [name] was concerned about his BP & it has not come down, therefore we decided to add LOSARTAN 100mg /d;  he will continue to monitor BP at home... ~  7/13:  BP=138/80 on BBlocker & ARB> denies CP, palpit, dizzy, ch in SOB, edema, etc; we reviewed diet, exercise, wt reduction strategies... ~  9/13:  BP= 128/82 & feeling better overall;  BUN=20, A1c=7.7, K=4.2  ARTERIOSCLEROTIC HEART DISEASE (ICD-414.00) - on ASA 325mg /d & now off Plavix since 2/12... followed [name] and his notes are reviewed... he quit smoking 2006, unfortunately he refuses therapy for his hypercholesterolemia...  ~  he had IWMI 6/06 w/ RCA PTCA & stent... ~  last cath= 7/07 w/ non-obstructive dis in 3 vessels... ~  Myoview 1/10 showed diaph attenuation without ischemia or infarct, EF= 64%... ~  Yearly f/u [name] for Cards & his notes are reviewed...  HYPERLIPIDEMIA (ICD-272.4) - pt states intolerant to all statins, and lipid clinic tried Zetia and Welcol- all of which he couldn't tolerate... on diet alone & not doing a very good job ("I'm not paying much attention to my diet")  He refuses to reconsider statin meds. ~  FLP 1/08 showed TChol 225, TG 164, HDL 49, LDL 154 ~  FLP 2/09 showed TChol 238, TG 149, HDL 48, LDL 167 ~  FLP 6/10 showed TChol 241, TGT 143, HDL 52, LDL 179 ~  FLP 6/11 showed TChol 217, TG 201, HDL 48, LDL 151 ~  FLP 6/12 showed TChol 236, TG 148, HDL 58, LDL 174...  rec to start Statin, pt refuses all chol meds, will continue "diet" management. ~  FLP 7/13 on diet alone showed TChol 235, TG 234, HDL 53, LDL 139... Still refuses med trial, we discussed low chol, low fat diet...  DIABETES MELLITUS >> ELEVATED FASTING BLOOD SUGAR >> new prob 6/12 w/ FBS= 125 & we discussed DM & the need for weight reduction, low carb diet, etc... In lieu of wt reduction we could start Metformin monotherapy but he doesn't want meds & prefers diet alone... ~  7/13:  Labs showed BS= 112, A1c= 7.5.Marland KitchenMarland Kitchen rec to start METFORMIN 500mg  Qam & get on low carb diet, get wt down... ~  9/13:  Feeling better but not checking BS at home; labs showed BS= 140, A1c=7.7 on Metform500/d... rec increase to 500Bid  HYPOTHYROIDISM (ICD-244.9) - stable on SYNTHROID 111mcg/d... ~  labs 1/08 showed TSH= 0.41... On Synthroid179mcg/d ~  labs 2/09 showed TSH= 0.39 ~  labs 6/10 showed TSH= 1.31... On Synthroid 185mcg/d ~  labs 6/11 showed TSH= 0.44 ~  Labs 6/12 on Levo175 showed TSH= 0.11 & he doesn't want to decr dose... ~  Labs 7/13 on Levo175 showed TSH= 0.12 & rec to decrease SYNTHROID to 181mcg/d. ~  Labs 9/13 on Levo150 showed TSH= 0.69  HYPOGONADISM >> pt presented 7/13 w/ poor energy, weakness, declining libido, etc;  Exam showed sl atrophic right testicle;  Testos level= 189 and he is rec to start ANDROGEL 1.62%- 2 pumps rubbed into the skin daily w/ ROV & recheck labs 68mo... ~  9/13:  Pt feeling better, more energy & drive, but not much stamina; we discussed incr exercise; Labs showed Testos level = 284 (improved & feeling better, continue same)...  DEGENERATIVE JOINT DISEASE (ICD-715.90) - he had right shoulder surg by DrSypher 2008.  HYPERURICEMIA (ICD-790.6) - he had ?gout attack by his hx & took his father's gout Rx (?what) w/ improvement... ~  labs 6/11 showed Uric = 8.0.Marland KitchenMarland Kitchen rec to start ALLOPURINOL 300mg /d ==> he took it for 9mo & stopped on his own preferring to watch & wait... ~  Labs 7/13  showed Uric= 7.9.Marland KitchenMarland Kitchen He prefers not to restart Allopurinol rx...  SURG/OTH PROC NOT CARRIED OUT BECAUSE PTS DECN (ICD-V64.2) - pt has repeatedly refused colonoscopy and we discussed this today- he refuses again and doesn't want appt to talk to Gi or consider alternatives to screening eg. virtual colonoscopy etc... ~  6/12:  Stool cards returned 2/6 pos for occult blood> he is referred to GI for further eval & colonoscopy (Note- pt refused to sched GI appt & continues to refuse colon check). ~  7/13:  Rectal exam neg & stool brown, heme neg; asked to repeat stool cards annually...   Past Surgical History  Procedure Date  . Shoulder surgery     Outpatient Encounter Prescriptions as of 11/21/2011  Medication Sig Dispense Refill  . aspirin 325 MG tablet Take 325 mg by mouth daily.        . cyclobenzaprine (FLEXERIL) 10 MG tablet Take 1 tablet (10 mg total) by mouth 3 (three) times daily as needed.  90 tablet  5  . levothyroxine (SYNTHROID, LEVOTHROID) 150 MCG tablet Take 1 tablet (150 mcg total) by mouth daily.  90 tablet  3  . losartan (COZAAR) 100 MG tablet Take 1 tablet (100 mg total) by mouth daily.  90 tablet  3  . metFORMIN (GLUCOPHAGE) 500 MG tablet Take 1 tablet (500 mg total) by mouth daily with breakfast.  90 tablet  3  . metoprolol succinate (TOPROL-XL) 50 MG 24 hr tablet Take 1 tablet (50 mg total) by mouth daily. Take with or immediately following a meal.  90 tablet  3  . Multiple Vitamin (MULTIVITAMIN) capsule Take 1 capsule by mouth daily.        . nitroGLYCERIN (NITROSTAT) 0.4 MG SL tablet Place 0.4 mg under the tongue every 5 (five) minutes as needed.        . Testosterone 20.25 MG/1.25GM (1.62%) GEL Place 2 application onto the skin daily.  1.25 g  5    Allergies  Allergen Reactions  . Ibuprofen     REACTION: intol to large amounts of  ibuprofen    Current Medications, Allergies, Past Medical History, Past Surgical History, Family History, and Social History were reviewed in  [employer] [facility] record.   Review of Systems    The patient denies fever, chills, sweats, anorexia, fatigue, weakness, malaise, weight loss, sleep disorder, blurring, diplopia, eye irritation, eye discharge, vision loss, eye pain, photophobia, earache, ear discharge, tinnitus, decreased hearing, nasal congestion, nosebleeds, sore throat, hoarseness, chest pain, palpitations, syncope, orthopnea, PND, peripheral edema, cough, dyspnea at rest, excessive sputum, hemoptysis, wheezing, pleurisy, nausea, vomiting, diarrhea, constipation, change in bowel habits, abdominal pain, melena, hematochezia, jaundice, gas/bloating, indigestion/heartburn, dysphagia, odynophagia, dysuria, hematuria, urinary frequency, urinary hesitancy, nocturia, incontinence, back pain, joint pain, joint swelling, muscle cramps, muscle weakness, stiffness, arthritis, sciatica, restless legs, leg pain at night, leg pain with exertion, rash, itching, dryness, suspicious lesions, paralysis, paresthesias, seizures, tremors, vertigo, transient blindness, frequent falls, frequent headaches, difficulty walking, depression, anxiety, memory loss, confusion, cold intolerance, heat intolerance, polydipsia, polyphagia, polyuria, unusual weight change, abnormal bruising, bleeding, enlarged lymph nodes, urticaria, allergic rash, hay fever, and recurrent infections.     Objective:   Physical Exam     WD, WN, 61 y/o WM in NAD GENERAL:  Alert & oriented; pleasant & cooperative... HEENT:  [place]/AT, EOM-wnl, PERRLA, EACs-clear, TMs-wnl, NOSE-clear, THROAT-clear & wnl. NECK:  Supple w/ fairROM; no JVD; normal carotid impulses w/o bruits; no thyromegaly or nodules palpated; no lymphadenopathy. CHEST:  Clear to P & A; without wheezes/ rales/ or rhonchi. HEART:  Regular Rhythm; without murmurs/ rubs/ or gallops heard... ABDOMEN:  Soft & nontender; normal bowel sounds; no organomegaly or masses detected... RECTAL:  Atrophic right testicle,  normal left; Rectal neg- stool heme neg; Prostate 3+ smooth... EXT: without deformities, mild arthritic changes; no varicose veins/ venous insuffic/ or edema. NEURO:  CN's intact; motor testing normal; sensory testing normal; gait normal & balance OK. DERM:  no lesions seen...  RADIOLOGY DATA:  Reviewed in the EPIC EMR & discussed w/ the patient...  LABORATORY DATA:  Reviewed in the EPIC EMR & discussed w/ the patient...   Assessment & Plan:    OSA>  Followed by [name]; stable on CPAP & uses it compliantly; he really likes this Rx & it has had a big impact...  AB>  He is c/o URI symptoms lingering after Augmentin Rx- try Saline, Flonase, MMW as discussed...  HBP>  BP 128/82 on BBlocker & ARB Rx; he needs better diet & wt reduction; ok to continue same meds for now...  CAD>  Followed by [name] on ASA, BBlocker, Diet Rx for Chol ([name] below); he needs to be more active & get wt down...  HYPERLIPIDEMIA>  FLP is not at goals; he is INTOL to all Chol/ Lipid meds & refuses to reconsider/ lipid clinic/ etc; on diet alone but not really on diet; he was prev counselled by the [facility]; we reviewed low chol/ low fat diet & need to lose weight...  DM>  on METFORMIN 500mg Qam & A1c is up to 7.7; rec to increase Metform500Bid...  HYPOTHYROID>  On Synthroid 156mcg/d & TSH is improved, continue same...  HYPOGONADISM>  His weakness/ lack of energy can be explained by Low-T= 189; he has atrophic right testicle, Rx w/ ANDROGEL 1.62%- 2pumps daily & Testos level improves to 284 & clinically feeling better (continue same)...  DJD/ Hyperuricemia>  Off Allopurinol in 2011 on his own; no recurrence of gout & he prefers to use OTC anti-inflamm Rx as needed; Uric= 7.9  He has repeatedly &  consisently refused to consider screening colonoscopy; offered appt w/ GI to discuss & offered alternative screening procedures but he is not interested & continues to decline eval... NOTE: 2/6 stool cards pos 6/12 & we begged him  to go to GI for colonoscopy & he still refuses...   Patient's Medications  New Prescriptions   DIPHENHYD-HYDROCORT-NYSTATIN (FIRST-DUKES MOUTHWASH) SUSP    1 teaspoon gargle and swallow four times daily as needed   FLUTICASONE (FLONASE) 50 MCG/ACT NASAL SPRAY    Place 2 sprays into the nose daily.  Previous Medications   ASPIRIN 325 MG TABLET    Take 325 mg by mouth daily.     CYCLOBENZAPRINE (FLEXERIL) 10 MG TABLET    Take 1 tablet (10 mg total) by mouth 3 (three) times daily as needed.   LEVOTHYROXINE (SYNTHROID, LEVOTHROID) 150 MCG TABLET    Take 1 tablet (150 mcg total) by mouth daily.   LOSARTAN (COZAAR) 100 MG TABLET    Take 1 tablet (100 mg total) by mouth daily.   METOPROLOL SUCCINATE (TOPROL-XL) 50 MG 24 HR TABLET    Take 1 tablet (50 mg total) by mouth daily. Take with or immediately following a meal.   MULTIPLE VITAMIN (MULTIVITAMIN) CAPSULE    Take 1 capsule by mouth daily.     NITROGLYCERIN (NITROSTAT) 0.4 MG SL TABLET    Place 0.4 mg under the tongue every 5 (five) minutes as needed.     TESTOSTERONE 20.25 MG/1.25GM (1.62%) GEL    Place 2 application onto the skin daily.  Modified Medications   Modified Medication Previous Medication   METFORMIN (GLUCOPHAGE) 500 MG TABLET metFORMIN (GLUCOPHAGE) 500 MG tablet      Take 1 tablet (500 mg total) by mouth 2 (two) times daily with a meal.    Take 1 tablet (500 mg total) by mouth daily with breakfast.  Discontinued Medications   No medications on file

## 2011-11-21 NOTE — Patient Instructions (Addendum)
Today we updated your med list in our EPIC system...    Continue your current medications the same...  Today we did your follow up DM labs + thyroid & low-T check on your meds...    We will call you w/ the results...  For the lingering URI>    Continue the nasal saline spray every 1-2H during the day...    Spray the FLONASE 2sp in each nostril at bedtime...    Use the Magic Mouthwash- 1 tsp gargle & swallow up to 4 times daily as needed...  Call for any questions...  Let's plan a follow up visit in 6 months time.Marland KitchenMarland Kitchen

## 2011-11-23 ENCOUNTER — Telehealth: Payer: Self-pay | Admitting: Pulmonary Disease

## 2011-11-23 MED ORDER — METFORMIN HCL 500 MG PO TABS: 500.0000 mg | ORAL_TABLET | Freq: Two times a day (BID) | ORAL | Status: DC

## 2011-11-23 NOTE — Telephone Encounter (Signed)
Called and spoke with pt and he is aware of the increase of the metformin to 1 po bid and new rx has been sent to the pharmacy.  Will send in new rx on Monday for the testosterone.  Pt is aware.

## 2011-11-26 MED ORDER — TESTOSTERONE 20.25 MG/1.25GM (1.62%) TD GEL: 2.0000 "application " | Freq: Every day | TRANSDERMAL | Status: DC

## 2011-11-26 NOTE — Telephone Encounter (Signed)
rx has been printed and signed by SN and faxed back to the pharmacy.

## 2012-01-01 ENCOUNTER — Ambulatory Visit (INDEPENDENT_AMBULATORY_CARE_PROVIDER_SITE_OTHER): Payer: 59 | Admitting: Pulmonary Disease

## 2012-01-01 ENCOUNTER — Encounter: Payer: Self-pay | Admitting: Pulmonary Disease

## 2012-01-01 VITALS — BP 130/82 | HR 71 | Temp 97.8°F | Ht 72.5 in | Wt 274.0 lb

## 2012-01-01 DIAGNOSIS — G4733 Obstructive sleep apnea (adult) (pediatric): Secondary | ICD-10-CM

## 2012-01-01 NOTE — Assessment & Plan Note (Signed)
The patient is doing very well with CPAP, and feels that he sleeps well with adequate daytime alertness.  He is overdue for new supplies and mask change, and I have asked him to try and keep up with this a little better.  I have also encouraged him to work aggressively on weight loss.

## 2012-01-01 NOTE — Patient Instructions (Addendum)
No change in cpap treatment.  Try and keep up with mask changes and supplies. Work on weight loss followup with me in one year if doing well.

## 2012-01-01 NOTE — Progress Notes (Signed)
  Subjective:    Patient ID: [name], male    DOB: 02-23-1951, 61 y.o.   MRN: [id number]  HPI The patient comes in today for followup of his known obstructive sleep apnea.  He is wearing CPAP compliantly, and reports no issues with his mask fit or pressure.  He feels that he is sleeping well, and denies any daytime alertness issues.  No one has commented on breakthrough snoring.   Review of Systems  Constitutional: Negative for fever and unexpected weight change.  HENT: Negative for ear pain, nosebleeds, congestion, sore throat, rhinorrhea, sneezing, trouble swallowing, dental problem, postnasal drip and sinus pressure.   Eyes: Negative for redness and itching.  Respiratory: Negative for cough, chest tightness, shortness of breath and wheezing.   Cardiovascular: Negative for palpitations and leg swelling.  Gastrointestinal: Negative for nausea and vomiting.  Genitourinary: Negative for dysuria.  Musculoskeletal: Negative for joint swelling.  Skin: Negative for rash.  Neurological: Negative for headaches.  Hematological: Does not bruise/bleed easily.  Psychiatric/Behavioral: Negative for dysphoric mood. The patient is not nervous/anxious.        Objective:   Physical Exam Overweight male in no acute distress Nose without purulence or discharge noted No skin breakdown or pressure necrosis from the CPAP mask Lower extremities without edema, no cyanosis Alert and oriented, moves all 4 extremities.       Assessment & Plan:

## 2012-05-21 ENCOUNTER — Other Ambulatory Visit (INDEPENDENT_AMBULATORY_CARE_PROVIDER_SITE_OTHER): Payer: 59

## 2012-05-21 ENCOUNTER — Encounter: Payer: Self-pay | Admitting: Pulmonary Disease

## 2012-05-21 ENCOUNTER — Ambulatory Visit (INDEPENDENT_AMBULATORY_CARE_PROVIDER_SITE_OTHER): Payer: 59 | Admitting: Pulmonary Disease

## 2012-05-21 VITALS — BP 142/70 | HR 76 | Temp 97.4°F | Ht 73.0 in | Wt 269.8 lb

## 2012-05-21 DIAGNOSIS — I2510 Atherosclerotic heart disease of native coronary artery without angina pectoris: Secondary | ICD-10-CM

## 2012-05-21 DIAGNOSIS — M1990 Unspecified osteoarthritis, unspecified site: Secondary | ICD-10-CM

## 2012-05-21 DIAGNOSIS — E039 Hypothyroidism, unspecified: Secondary | ICD-10-CM

## 2012-05-21 DIAGNOSIS — E785 Hyperlipidemia, unspecified: Secondary | ICD-10-CM

## 2012-05-21 DIAGNOSIS — E291 Testicular hypofunction: Secondary | ICD-10-CM

## 2012-05-21 DIAGNOSIS — R7989 Other specified abnormal findings of blood chemistry: Secondary | ICD-10-CM

## 2012-05-21 DIAGNOSIS — G4733 Obstructive sleep apnea (adult) (pediatric): Secondary | ICD-10-CM

## 2012-05-21 DIAGNOSIS — E119 Type 2 diabetes mellitus without complications: Secondary | ICD-10-CM

## 2012-05-21 DIAGNOSIS — I10 Essential (primary) hypertension: Secondary | ICD-10-CM

## 2012-05-21 DIAGNOSIS — E663 Overweight: Secondary | ICD-10-CM

## 2012-05-21 LAB — CBC WITH DIFFERENTIAL/PLATELET
Basophils Absolute: 0 10*3/uL (ref 0.0–0.1)
Eosinophils Relative: 6.1 % — ABNORMAL HIGH (ref 0.0–5.0)
HCT: 41.9 % (ref 39.0–52.0)
Hemoglobin: 14.2 g/dL (ref 13.0–17.0)
Lymphocytes Relative: 24.1 % (ref 12.0–46.0)
Lymphs Abs: 2.3 10*3/uL (ref 0.7–4.0)
Monocytes Relative: 9.5 % (ref 3.0–12.0)
Platelets: 278 10*3/uL (ref 150.0–400.0)
RDW: 13.8 % (ref 11.5–14.6)
WBC: 9.7 10*3/uL (ref 4.5–10.5)

## 2012-05-21 LAB — HEPATIC FUNCTION PANEL
AST: 20 U/L (ref 0–37)
Albumin: 4.5 g/dL (ref 3.5–5.2)
Alkaline Phosphatase: 38 U/L — ABNORMAL LOW (ref 39–117)
Total Protein: 7.5 g/dL (ref 6.0–8.3)

## 2012-05-21 LAB — BASIC METABOLIC PANEL
BUN: 23 mg/dL (ref 6–23)
CO2: 29 mEq/L (ref 19–32)
Calcium: 10.7 mg/dL — ABNORMAL HIGH (ref 8.4–10.5)
GFR: 71.54 mL/min (ref >60.00)
Glucose, Bld: 130 mg/dL — ABNORMAL HIGH (ref 70–99)

## 2012-05-21 LAB — HEMOGLOBIN A1C: Hgb A1c MFr Bld: 7.2 % — ABNORMAL HIGH (ref 4.6–6.5)

## 2012-05-21 LAB — TSH: TSH: 1.93 u[IU]/mL (ref 0.35–5.50)

## 2012-05-21 MED ORDER — FLUOCINONIDE-E 0.05 % EX CREA: TOPICAL_CREAM | CUTANEOUS | Status: DC

## 2012-05-21 NOTE — Progress Notes (Signed)
Subjective:    Patient ID: [name], male    DOB: 11/19/1950, 62 y.o.   MRN: [id number]  HPI 62 y/o WM here for a follow up visit... he has multiple medical problems as noted below...  ~  August 21, 2010:  Yearly ROV & his BP has been sl elevated on his home checks> pretty consistently in the 150s/ 80s he says on his ToprolXL monotherapy;  Not really on a diet & wt is up 12# to 270# by our scales;  He saw Delton See 2/12 & pt reports that Delton See stopped his Plavix Rx but was concerned about his BP;  We decided to add LOSARTAN 100mg /d & get back on track w/ diet & exercise therapy to reduce weight...  He will return for FastingLabs (LDL 174- still refuses all meds; FBS 125- new DM doesn't want meds thwerefore must get wt down, f/u 621mo) & check stool cards as well (still refuses colonoscopy)... See prob list below>>  ~  August 27, 2011:  1 year ROV & [name] is c/o lack of energy, some weakness, slow decline he says> we discussed checking full labs including potassium, CBC, Thyroid & Testosterone (see results below)...  He is also c/o dry skin on hand w/ some cracking & occas bleeds> we discussed moisturing regimen w/ Lac-hydrin lotion & latex gloves Qhs, avoid water exposure (use playtex gloves etc), if no better then refer to derm...    He saw DrKatz for Cards 5/13> CAD w/ stents placed 2006; Myoview 2010 w/o ischemia; denies CP, palpit, etc & he is active; BP was borderline but they didn't change meds, asked to get on diet, get wt down, he has proven intol to all statins...    We reviewed prob list, meds, xrays and labs> see below>> EKG 5/13 showed NSR, rate66, WNL, NAD... CXR 7/13 showed normal heart size, clear lungs, NAD... LABS 7/13:  FLP- not at goals on diet alone;  Chems- on x BS=112 A1c=7.5;  CBC- wnl;  TSH=0.12 on Levo175;  PSA=0.53;  Testos=189;  Uric=7.9  ~  November 21, 2011:  21mo ROV and [name] has 4 issues to discuss>    Recent URI w/ PND & sore throat> we called in Augmentin 9/3 &  symptoms much improved, just lingering post nasal drip & intermitt sore throat;  We discussed Rx w/ Saline during the day, Flonase- 2sp in each nostril Qhs, and MMW as needed... He notes prev reflux symptoms are much improved...    When seen last 7/13 he had BS=112 A1c=7.5 and discussed low carb diet, wt reduction and start Metformin 500mg  Qam; doing well by report, not checking BS at home, and due for f/u labs today...    Similarly his thyroid was over suppressed on Synthroid1102mcg/d; we decreased to 165mcg/d 7 f/u labs due now...    Finally his labs showed Low-T at 189 & he was fatigued etc; we started Androgel 1.62%- 2 pumps daily and he reports feeling better overall, more energy, more drive, just no stamina & we discussed the importance of exercise; due for recheck Testos level on the Androgel 1.62%...    We reviewed prob list, meds, xrays and labs> see below for updates >> he refuses flu vaccine... LABS 9/13:  Chems- ok x BS=140 A1c=7.7;  TSH=0.69 on Levothy150...   ~  May 21, 2012:  621mo ROV & [name] is c/o same vague systemic symptoms that is hard for him to articulate> "like a weird muscle weakness" yet able to walk 1mi &  musc testing is wnl 5/5 throughout; "I don't feel like I'm in control of my body" yet neuro exam is wnl, no hx falls, etc; overall this is likely a fatigue related symptom & he notes poor stamina but he says using Androgel regularly & his overall sense of well-being is improved even though Testos level hasn't come up much- offered recheck & consider incr dose to 4 pumps but he declines this intervention; he denies CP, & states his breathing is good; I suggested poss Rheum eval & poss f/u Cardiac eval w/ ischemic work-up & he will consider these options- but he says he wants more definitive testing for Lyme dis having tested neg in the past (no rash, hx mult tick exposures), but he's done some internet research & he says he feels better when on antibiotics for anything (we will  recheck Lyme titer)... We reviewed the following medical problems during today's office visit >>     OSA> Sleep study 2009 w/ AHI=15, desat to 77%, loud snoring; followed by DrClance- on CPAP12 w/ good compliance & excellent response; denies daytime sleepiness issues; seen 11/13 & asked to keep up w/ new supplies & work on wt reduction...    HBP> on MetopER50, Losar100; BP= 142/70 & he denies HA, CP, palpit, SOB, edema...    CAD> on ASA325; Hx IWMI 2006 w/ PTCA & stent to RCA; followed by Delton See; states walking 38mi/d w/o CP etc; he needs to be more active, get on diet, get wt down...    Hyperlipid> on diet alone; not fasting today for labs; last FLP 7/13 showed TChol 235, TG 234, HDL 53, LDL 139; he continues to decline medication for this problem...    DM> on Metform500Bid; taking med regularly he says & feeling better; wt w/o change ~270#; labs 3/14 showed BS=130, A1c=7.2; Rec same med, better diet, get wt down...    Hypothy> on Synth150; clinically & biochem euthyroid w/ TSH= 1.93    Hypogonad> on Androgel 1.62%- 2pumps; he declines recheck of Testos level & incr dose of topical replacement Rx...    DJD, elev uric> on Flexeril10Tid prn;     Dermatitis> dry skin dermatitis & rec to use moisturizing cream/ Lidex-E/ etc...  We reviewed prob list, meds, xrays and labs> see below for updates >> [name] continues to refuse all vaccinations, refuses colonoscopy, etc... LABS 3/14:  Chems- OK x BS=130, A1c=7.2;  CBC- wnl;  TSH=1.93 on Synth150;  PSA=0.49... NOTE> Lyme titers were ordered but never collected...          Problem List:   OBSTRUCTIVE SLEEP APNEA (ICD-327.23) - prev ROS was pos for snoring and symptoms suggesting signif OSA... he denied daytime hypersomnolence, but he noted decreased O2 sats at night in the hosp post op!... Sleep Study 2/09 showed AHI= 15, RDI= 16, Desat to 77%, +snoring... started on CPAP 12 by DrClance & using it nightly w/ good response, tolerating well, wife pleased... ~   f/u DrClance 10/10- stable on CPAP, continue same. ~  f/u DrClance 11/11- remains stable on CPAP nightly doing well ~  7/13:  He states using CPAP compliantly, resting well, wakes refreshed, no issues w/ daytime alertness... ~  3/14:  He continues to use his CPAP, doing satis w/o interface issues or prob w/ daytime alertness etc...  ASTHMATIC BRONCHITIS, ACUTE (ICD-466.0) - ex-smoker quit in 2006 w/ his MI... no recent exac and he denies cough, sputum, hemoptysis, worsening dyspnea,  wheezing, chest pains, etc... he is on no regular medication, but  uses MUCINEX as needed... ~  CXR 6/11 showed clear lungs, WNL.Marland Kitchen.  ~  CXR 7/13 showed normal heart size, clear lungs, NAD... ~  9/13:  He called w/ URI/ sinus infection & given Augmentin by phone; improved but lingering PND & sore throat- treated w/ Saline, Flonase, MMW...  HYPERTENSION (ICD-401.9) - on TOPROL XL 50mg /d & LOSARTAN 100mg /d... not following any sort of diet and weight is still around 270#... not restricting sodium... ~  6/12:  BP=136/84 on Metoprolol50 & higher at home averaging 150s/80s> pt denies HA, fatigue, visual changes, CP, palipit, dizziness, syncope, dyspnea, edema, etc; we have repeatedly discussed diet rx and salt restriction;  Delton See was concerned about his BP & it has not come down, therefore we decided to add LOSARTAN 100mg /d;  he will continue to monitor BP at home... ~  7/13:  BP=138/80 on BBlocker & ARB> denies CP, palpit, dizzy, ch in SOB, edema, etc; we reviewed diet, exercise, wt reduction strategies... ~  9/13:  BP= 128/82 & feeling better overall;  BUN=20, A1c=7.7, K=4.2 ~  3/14: on MetopER50, Losar100; BP= 142/70 & he denies HA, CP, palpit, SOB, edema...  ARTERIOSCLEROTIC HEART DISEASE (ICD-414.00) - on ASA 325mg /d & now off Plavix since 2/12... followed DrKatz and his notes are reviewed... he quit smoking 2006, unfortunately he refuses therapy for his hypercholesterolemia...  ~  he had IWMI 6/06 w/ RCA PTCA &  stent... ~  last cath= 7/07 w/ non-obstructive dis in 3 vessels... ~  Myoview 1/10 showed diaph attenuation without ischemia or infarct, EF= 64%... ~  Yearly f/u DrKatz for Cards & his notes are reviewed...  HYPERLIPIDEMIA (ICD-272.4) - pt states intolerant to all statins, and lipid clinic tried Zetia and Welcol- all of which he couldn't tolerate... on diet alone & not doing a very good job ("I'm not paying much attention to my diet")  He refuses to reconsider statin meds. ~  FLP 1/08 showed TChol 225, TG 164, HDL 49, LDL 154 ~  FLP 2/09 showed TChol 238, TG 149, HDL 48, LDL 167 ~  FLP 6/10 showed TChol 241, TGT 143, HDL 52, LDL 179 ~  FLP 6/11 showed TChol 217, TG 201, HDL 48, LDL 151 ~  FLP 6/12 showed TChol 236, TG 148, HDL 58, LDL 174... rec to start Statin, pt refuses all chol meds, will continue "diet" management. ~  FLP 7/13 on diet alone showed TChol 235, TG 234, HDL 53, LDL 139... Still refuses med trial, we discussed low chol, low fat diet...  DIABETES MELLITUS >> ELEVATED FASTING BLOOD SUGAR >> new prob 6/12 w/ FBS= 125 & we discussed DM & the need for weight reduction, low carb diet, etc... In lieu of wt reduction we could start Metformin monotherapy but he doesn't want meds & prefers diet alone... ~  7/13:  Labs showed BS= 112, A1c= 7.5.Marland KitchenMarland Kitchen rec to start METFORMIN 500mg  Qam & get on low carb diet, get wt down... ~  9/13:  Feeling better but not checking BS at home; labs showed BS= 140, A1c=7.7 on Metform500/d... rec increase to 500Bid ~  3/14:   on Metform500Bid; taking med regularly he says & feeling better; wt w/o change ~270#; labs 3/14 showed BS=130, A1c=7.2; Rec same med, better diet, get wt down...  HYPOTHYROIDISM (ICD-244.9) - stable on SYNTHROID 124mcg/d... ~  labs 1/08 showed TSH= 0.41... On Synthroid169mcg/d ~  labs 2/09 showed TSH= 0.39 ~  labs 6/10 showed TSH= 1.31... On Synthroid 137mcg/d ~  labs 6/11 showed TSH= 0.44 ~  Labs 6/12 on Levo175 showed TSH= 0.11 & he  doesn't want to decr dose... ~  Labs 7/13 on Levo175 showed TSH= 0.12 & rec to decrease SYNTHROID to 148mcg/d. ~  Labs 9/13 on Levo150 showed TSH= 0.69 ~  3/14: on Synth150; clinically & biochem euthyroid w/ TSH= 1.93  HYPOGONADISM >> pt presented 7/13 w/ poor energy, weakness, declining libido, etc;  Exam showed sl atrophic right testicle;  Testos level= 189 and he is rec to start ANDROGEL 1.62%- 2 pumps rubbed into the skin daily w/ ROV & recheck labs 101mo... ~  9/13:  Pt feeling better, more energy & drive, but not much stamina; we discussed incr exercise; Labs showed Testos level = 284 (improved & feeling better, continue same). ~  3/14: on Androgel 1.62%- 2pumps; he declines recheck of Testos level & incr dose of topical replacement Rx.  DEGENERATIVE JOINT DISEASE (ICD-715.90) - he had right shoulder surg by DrSypher 2008.  HYPERURICEMIA (ICD-790.6) - he had ?gout attack by his hx & took his father's gout Rx (?what) w/ improvement... ~  labs 6/11 showed Uric = 8.0.Marland KitchenMarland Kitchen rec to start ALLOPURINOL 300mg /d ==> he took it for 58mo & stopped on his own preferring to watch & wait... ~  Labs 7/13 showed Uric= 7.9.Marland KitchenMarland Kitchen He prefers not to restart Allopurinol rx...  SURG/OTH PROC NOT CARRIED OUT BECAUSE PTS DECN (ICD-V64.2) - pt has repeatedly refused colonoscopy and we discussed this today- he refuses again and doesn't want appt to talk to Gi or consider alternatives to screening eg. virtual colonoscopy etc... ~  6/12:  Stool cards returned 2/6 pos for occult blood> he is referred to GI for further eval & colonoscopy (Note- pt refused to sched GI appt & continues to refuse colon check). ~  7/13:  Rectal exam neg & stool brown, heme neg; asked to repeat stool cards annually...   Past Surgical History  Procedure Laterality Date  . Shoulder surgery      Outpatient Encounter Prescriptions as of 05/21/2012  Medication Sig Dispense Refill  . aspirin 325 MG tablet Take 325 mg by mouth daily.        .  cyclobenzaprine (FLEXERIL) 10 MG tablet Take 1 tablet (10 mg total) by mouth 3 (three) times daily as needed.  90 tablet  5  . Diphenhyd-Hydrocort-Nystatin (FIRST-DUKES MOUTHWASH) SUSP 1 teaspoon gargle and swallow four times daily as needed  120 mL  5  . fluticasone (FLONASE) 50 MCG/ACT nasal spray Place 2 sprays into the nose daily.  16 g  6  . levothyroxine (SYNTHROID, LEVOTHROID) 150 MCG tablet Take 1 tablet (150 mcg total) by mouth daily.  90 tablet  3  . losartan (COZAAR) 100 MG tablet Take 1 tablet (100 mg total) by mouth daily.  90 tablet  3  . metFORMIN (GLUCOPHAGE) 500 MG tablet Take 1 tablet (500 mg total) by mouth 2 (two) times daily with a meal.  180 tablet  3  . metoprolol succinate (TOPROL-XL) 50 MG 24 hr tablet Take 1 tablet (50 mg total) by mouth daily. Take with or immediately following a meal.  90 tablet  3  . Multiple Vitamin (MULTIVITAMIN) capsule Take 1 capsule by mouth daily.        . nitroGLYCERIN (NITROSTAT) 0.4 MG SL tablet Place 0.4 mg under the tongue every 5 (five) minutes as needed.        . Testosterone 20.25 MG/1.25GM (1.62%) GEL Place 2 application onto the skin daily.  1.25 g  5  No facility-administered encounter medications on file as of 05/21/2012.    Allergies  Allergen Reactions  . Ibuprofen     REACTION: intol to large amounts of ibuprofen    Current Medications, Allergies, Past Medical History, Past Surgical History, Family History, and Social History were reviewed in [employer] record.   Review of Systems    The patient denies fever, chills, sweats, anorexia, fatigue, weakness, malaise, weight loss, sleep disorder, blurring, diplopia, eye irritation, eye discharge, vision loss, eye pain, photophobia, earache, ear discharge, tinnitus, decreased hearing, nasal congestion, nosebleeds, sore throat, hoarseness, chest pain, palpitations, syncope, orthopnea, PND, peripheral edema, cough, dyspnea at rest, excessive sputum, hemoptysis,  wheezing, pleurisy, nausea, vomiting, diarrhea, constipation, change in bowel habits, abdominal pain, melena, hematochezia, jaundice, gas/bloating, indigestion/heartburn, dysphagia, odynophagia, dysuria, hematuria, urinary frequency, urinary hesitancy, nocturia, incontinence, back pain, joint pain, joint swelling, muscle cramps, muscle weakness, stiffness, arthritis, sciatica, restless legs, leg pain at night, leg pain with exertion, rash, itching, dryness, suspicious lesions, paralysis, paresthesias, seizures, tremors, vertigo, transient blindness, frequent falls, frequent headaches, difficulty walking, depression, anxiety, memory loss, confusion, cold intolerance, heat intolerance, polydipsia, polyphagia, polyuria, unusual weight change, abnormal bruising, bleeding, enlarged lymph nodes, urticaria, allergic rash, hay fever, and recurrent infections.     Objective:   Physical Exam     WD, WN, 62 y/o WM in NAD GENERAL:  Alert & oriented; pleasant & cooperative... HEENT:  [place]/AT, EOM-wnl, PERRLA, EACs-clear, TMs-wnl, NOSE-clear, THROAT-clear & wnl. NECK:  Supple w/ fairROM; no JVD; normal carotid impulses w/o bruits; no thyromegaly or nodules palpated; no lymphadenopathy. CHEST:  Clear to P & A; without wheezes/ rales/ or rhonchi. HEART:  Regular Rhythm; without murmurs/ rubs/ or gallops heard... ABDOMEN:  Soft & nontender; normal bowel sounds; no organomegaly or masses detected... (RECTAL:  Atrophic right testicle, normal left; Rectal neg- stool heme neg; Prostate 3+ smooth...) EXT: without deformities, mild arthritic changes; no varicose veins/ venous insuffic/ or edema. NEURO:  CN's intact; motor testing normal; sensory testing normal; gait normal & balance OK. DERM:  no lesions seen...  RADIOLOGY DATA:  Reviewed in the EPIC EMR & discussed w/ the patient...  LABORATORY DATA:  Reviewed in the EPIC EMR & discussed w/ the patient...   Assessment & Plan:    OSA>  Followed by DrClance; stable  on CPAP & uses it compliantly; he really likes this Rx & it has had a big impact...  AB>  Back to baseline w/o URI symptoms...  HBP>  BP stable on BBlocker & ARB Rx; he needs better diet & wt reduction; ok to continue same meds for now...  CAD>  Followed by Delton See on ASA, BBlocker, Diet Rx for Chol (see below); he needs to be more active & get wt down...  HYPERLIPIDEMIA>  FLP is not at goals; he is INTOL to all Chol/ Lipid meds & refuses to reconsider/ lipid clinic/ etc; on diet alone but not really on diet; he was prev [facility] the [facility]; we reviewed low chol/ low fat diet & need to lose weight...  DM>  on METFORMIN 500mg Bid & A1c improved to 7.2; rec to continue Metform500Bid + diet etc...  HYPOTHYROID>  On Synthroid 157mcg/d & TSH is improved, continue same...  HYPOGONADISM>  His weakness/ lack of energy can be explained by Low-T; he has atrophic right testicle, Rx w/ ANDROGEL 1.62%- 2pumps daily & Testos level improved to 284 & clinically feeling better (doesn't want to incr dose)...  DJD/ Hyperuricemia>  Off Allopurinol in 2011  on his own; no recurrence of gout & he prefers to use OTC anti-inflamm Rx as needed; Uric= 7.9  He has repeatedly & consisently refused to consider screening colonoscopy; offered appt w/ GI to discuss & offered alternative screening procedures but he is not interested & continues to decline eval... NOTE: 2/6 stool cards pos 6/12 & we begged him to go to GI for colonoscopy & he still refuses...   Patient's Medications  New Prescriptions   FLUOCINONIDE-EMOLLIENT (LIDEX-E) 0.05 % CREAM    Apply as directed  Previous Medications   ASPIRIN 325 MG TABLET    Take 325 mg by mouth daily.     CYCLOBENZAPRINE (FLEXERIL) 10 MG TABLET    Take 1 tablet (10 mg total) by mouth 3 (three) times daily as needed.   DIPHENHYD-HYDROCORT-NYSTATIN (FIRST-DUKES MOUTHWASH) SUSP    1 teaspoon gargle and swallow four times daily as needed   FLUTICASONE (FLONASE) 50 MCG/ACT NASAL SPRAY     Place 2 sprays into the nose daily.   LEVOTHYROXINE (SYNTHROID, LEVOTHROID) 150 MCG TABLET    Take 1 tablet (150 mcg total) by mouth daily.   LOSARTAN (COZAAR) 100 MG TABLET    Take 1 tablet (100 mg total) by mouth daily.   METFORMIN (GLUCOPHAGE) 500 MG TABLET    Take 1 tablet (500 mg total) by mouth 2 (two) times daily with a meal.   METOPROLOL SUCCINATE (TOPROL-XL) 50 MG 24 HR TABLET    Take 1 tablet (50 mg total) by mouth daily. Take with or immediately following a meal.   MULTIPLE VITAMIN (MULTIVITAMIN) CAPSULE    Take 1 capsule by mouth daily.     NITROGLYCERIN (NITROSTAT) 0.4 MG SL TABLET    Place 0.4 mg under the tongue every 5 (five) minutes as needed.     TESTOSTERONE 20.25 MG/1.25GM (1.62%) GEL    Place 2 application onto the skin daily.  Modified Medications   No medications on file  Discontinued Medications   No medications on file

## 2012-05-21 NOTE — Patient Instructions (Addendum)
Today we updated your med list in our EPIC system...    Continue your current medications the same...  Today we did your follow up blood work, and included the [employer] lab LYME testing...    We will contact you w/ the results when available...   We discussed poss Rheumatology eval & Cardiology recheck for your exercise related muscle symptoms...    Please let me know if you want to proceed w/ these evaluations...  Call for any questions...  Let's plan a follow up visit in 104mo, sooner if needed for problems.Marland KitchenMarland Kitchen

## 2012-05-30 ENCOUNTER — Other Ambulatory Visit: Payer: Self-pay | Admitting: Pulmonary Disease

## 2012-08-20 ENCOUNTER — Other Ambulatory Visit: Payer: Self-pay | Admitting: Pulmonary Disease

## 2012-09-08 ENCOUNTER — Other Ambulatory Visit: Payer: Self-pay | Admitting: Pulmonary Disease

## 2012-10-07 ENCOUNTER — Other Ambulatory Visit: Payer: Self-pay | Admitting: Pulmonary Disease

## 2012-11-06 ENCOUNTER — Other Ambulatory Visit: Payer: Self-pay | Admitting: Pulmonary Disease

## 2012-11-17 ENCOUNTER — Other Ambulatory Visit: Payer: Self-pay | Admitting: Pulmonary Disease

## 2012-12-11 ENCOUNTER — Other Ambulatory Visit: Payer: Self-pay | Admitting: Pulmonary Disease

## 2012-12-31 ENCOUNTER — Encounter: Payer: Self-pay | Admitting: Pulmonary Disease

## 2012-12-31 ENCOUNTER — Ambulatory Visit (INDEPENDENT_AMBULATORY_CARE_PROVIDER_SITE_OTHER): Payer: 59 | Admitting: Pulmonary Disease

## 2012-12-31 VITALS — BP 132/76 | HR 71 | Temp 97.6°F | Ht 72.5 in | Wt 257.6 lb

## 2012-12-31 DIAGNOSIS — G4733 Obstructive sleep apnea (adult) (pediatric): Secondary | ICD-10-CM

## 2012-12-31 NOTE — Assessment & Plan Note (Signed)
The patient is doing very well from a sleep apnea standpoint.  He is wearing CPAP compliantly, feels that he is sleeping well, and has lost 17 pounds since last visit.  I will send an order to his home care company for new supplies and to have his machine checked, and I have asked him to continue working on weight loss.

## 2012-12-31 NOTE — Progress Notes (Signed)
  Subjective:    Patient ID: [name], male    DOB: 07/16/1950, 62 y.o.   MRN: [id number]  HPI The patient comes in today for followup of his obstructive sleep apnea.He is wearing CPAP compliantly, and is having no issues with his mask fit or pressure.  He is due for new supplies, and is asking about a new machine.  He feels that his machine is working properly, but it has not been checked in many years.  The patient has lost 17 pounds since his last visit here.   Review of Systems  Constitutional: Negative for fever and unexpected weight change.  HENT: Negative for congestion, dental problem, ear pain, nosebleeds, postnasal drip, rhinorrhea, sinus pressure, sneezing, sore throat and trouble swallowing.   Eyes: Negative for redness and itching.  Respiratory: Negative for cough, chest tightness, shortness of breath and wheezing.   Cardiovascular: Negative for palpitations and leg swelling.  Gastrointestinal: Negative for nausea and vomiting.  Genitourinary: Negative for dysuria.  Musculoskeletal: Negative for joint swelling.  Skin: Negative for rash.  Neurological: Negative for headaches.  Hematological: Does not bruise/bleed easily.  Psychiatric/Behavioral: Negative for dysphoric mood. The patient is not nervous/anxious.        Objective:   Physical Exam Overweight male in no acute distress Nose without purulence or discharge noted No skin breakdown or pressure necrosis from the CPAP mask Neck without lymphadenopathy or thyromegaly Lower extremities without edema, no cyanosis Alert and oriented, does not appear to be sleepy, moves all 4 extremities.       Assessment & Plan:

## 2012-12-31 NOTE — Patient Instructions (Signed)
Will get you new supplies and mask.  Will also have them check the functioning of your machine. Keep working on weight loss.  You are doing well.  followup with me in one year.

## 2013-03-17 ENCOUNTER — Emergency Department (HOSPITAL_COMMUNITY): Payer: 59

## 2013-03-17 ENCOUNTER — Telehealth: Payer: Self-pay | Admitting: Pulmonary Disease

## 2013-03-17 ENCOUNTER — Encounter (HOSPITAL_COMMUNITY): Payer: Self-pay | Admitting: Emergency Medicine

## 2013-03-17 ENCOUNTER — Observation Stay (HOSPITAL_COMMUNITY)
Admission: EM | Admit: 2013-03-17 | Discharge: 2013-03-19 | Disposition: A | Discharge status: Home / Self Care | Payer: 59 | Attending: Surgery | Admitting: Surgery

## 2013-03-17 DIAGNOSIS — E291 Testicular hypofunction: Secondary | ICD-10-CM | POA: Insufficient documentation

## 2013-03-17 DIAGNOSIS — K810 Acute cholecystitis: Secondary | ICD-10-CM

## 2013-03-17 DIAGNOSIS — E119 Type 2 diabetes mellitus without complications: Secondary | ICD-10-CM | POA: Insufficient documentation

## 2013-03-17 DIAGNOSIS — I2510 Atherosclerotic heart disease of native coronary artery without angina pectoris: Secondary | ICD-10-CM | POA: Insufficient documentation

## 2013-03-17 DIAGNOSIS — I252 Old myocardial infarction: Secondary | ICD-10-CM | POA: Insufficient documentation

## 2013-03-17 DIAGNOSIS — E663 Overweight: Secondary | ICD-10-CM | POA: Insufficient documentation

## 2013-03-17 DIAGNOSIS — E039 Hypothyroidism, unspecified: Secondary | ICD-10-CM | POA: Insufficient documentation

## 2013-03-17 DIAGNOSIS — K8010 Calculus of gallbladder with chronic cholecystitis without obstruction: Principal | ICD-10-CM | POA: Insufficient documentation

## 2013-03-17 DIAGNOSIS — G4733 Obstructive sleep apnea (adult) (pediatric): Secondary | ICD-10-CM | POA: Insufficient documentation

## 2013-03-17 DIAGNOSIS — E785 Hyperlipidemia, unspecified: Secondary | ICD-10-CM | POA: Insufficient documentation

## 2013-03-17 DIAGNOSIS — Z87891 Personal history of nicotine dependence: Secondary | ICD-10-CM | POA: Insufficient documentation

## 2013-03-17 DIAGNOSIS — M1990 Unspecified osteoarthritis, unspecified site: Secondary | ICD-10-CM | POA: Insufficient documentation

## 2013-03-17 DIAGNOSIS — R109 Unspecified abdominal pain: Secondary | ICD-10-CM

## 2013-03-17 DIAGNOSIS — I10 Essential (primary) hypertension: Secondary | ICD-10-CM | POA: Insufficient documentation

## 2013-03-17 DIAGNOSIS — Z9889 Other specified postprocedural states: Secondary | ICD-10-CM | POA: Insufficient documentation

## 2013-03-17 HISTORY — DX: Gastro-esophageal reflux disease without esophagitis: K21.9

## 2013-03-17 HISTORY — DX: Gout, unspecified: M10.9

## 2013-03-17 HISTORY — DX: Acute myocardial infarction, unspecified: I21.9

## 2013-03-17 HISTORY — DX: Obstructive sleep apnea (adult) (pediatric): G47.33

## 2013-03-17 HISTORY — DX: Chronic obstructive pulmonary disease, unspecified: J44.9

## 2013-03-17 HISTORY — DX: Unspecified asthma, uncomplicated: J45.909

## 2013-03-17 HISTORY — DX: Unspecified chronic bronchitis: J42

## 2013-03-17 HISTORY — DX: Unspecified osteoarthritis, unspecified site: M19.90

## 2013-03-17 HISTORY — DX: Type 2 diabetes mellitus without complications: E11.9

## 2013-03-17 HISTORY — DX: Dependence on other enabling machines and devices: Z99.89

## 2013-03-17 LAB — CBC WITH DIFFERENTIAL/PLATELET
Basophils Absolute: 0 10*3/uL (ref 0.0–0.1)
Basophils Relative: 0 % (ref 0–1)
Eosinophils Absolute: 0.1 10*3/uL (ref 0.0–0.7)
Eosinophils Relative: 1 % (ref 0–5)
HCT: 40.3 % (ref 39.0–52.0)
Hemoglobin: 13.6 g/dL (ref 13.0–17.0)
Lymphocytes Relative: 15 % (ref 12–46)
Lymphs Abs: 1.8 10*3/uL (ref 0.7–4.0)
MCH: 30 pg (ref 26.0–34.0)
MCHC: 33.7 g/dL (ref 30.0–36.0)
MCV: 88.8 fL (ref 78.0–100.0)
Monocytes Absolute: 0.8 10*3/uL (ref 0.1–1.0)
Monocytes Relative: 6 % (ref 3–12)
Neutro Abs: 9.7 10*3/uL — ABNORMAL HIGH (ref 1.7–7.7)
Neutrophils Relative %: 78 % — ABNORMAL HIGH (ref 43–77)
Platelets: 251 10*3/uL (ref 150–400)
RBC: 4.54 MIL/uL (ref 4.22–5.81)
RDW: 13.8 % (ref 11.5–15.5)
WBC: 12.5 10*3/uL — ABNORMAL HIGH (ref 4.0–10.5)

## 2013-03-17 LAB — COMPREHENSIVE METABOLIC PANEL
ALT: 8 U/L (ref 0–53)
AST: 16 U/L (ref 0–37)
Albumin: 4.2 g/dL (ref 3.5–5.2)
Alkaline Phosphatase: 34 U/L — ABNORMAL LOW (ref 39–117)
BUN: 30 mg/dL — ABNORMAL HIGH (ref 6–23)
CO2: 25 mEq/L (ref 19–32)
Calcium: 9.7 mg/dL (ref 8.4–10.5)
Chloride: 96 mEq/L (ref 96–112)
Creatinine, Ser: 1.16 mg/dL (ref 0.50–1.35)
GFR calc Af Amer: 76 mL/min — ABNORMAL LOW (ref >90)
GFR calc non Af Amer: 66 mL/min — ABNORMAL LOW (ref >90)
Glucose, Bld: 154 mg/dL — ABNORMAL HIGH (ref 70–99)
Potassium: 4.2 mEq/L (ref 3.7–5.3)
Sodium: 137 mEq/L (ref 137–147)
Total Bilirubin: 0.3 mg/dL (ref 0.3–1.2)
Total Protein: 7.2 g/dL (ref 6.0–8.3)

## 2013-03-17 LAB — URINALYSIS, ROUTINE W REFLEX MICROSCOPIC
Bilirubin Urine: NEGATIVE
GLUCOSE, UA: NEGATIVE mg/dL
Hgb urine dipstick: NEGATIVE
Ketones, ur: 15 mg/dL — AB
LEUKOCYTES UA: NEGATIVE
Nitrite: NEGATIVE
PH: 5.5 (ref 5.0–8.0)
PROTEIN: NEGATIVE mg/dL
Specific Gravity, Urine: 1.035 — ABNORMAL HIGH (ref 1.005–1.030)
Urobilinogen, UA: 0.2 mg/dL (ref 0.0–1.0)

## 2013-03-17 LAB — GLUCOSE, CAPILLARY
GLUCOSE-CAPILLARY: 129 mg/dL — AB (ref 70–99)
Glucose-Capillary: 87 mg/dL (ref 70–99)
Glucose-Capillary: 108 mg/dL — ABNORMAL HIGH (ref 70–99)

## 2013-03-17 LAB — POCT I-STAT TROPONIN I: Troponin i, poc: 0 ng/mL (ref 0.00–0.08)

## 2013-03-17 LAB — LIPASE, BLOOD: Lipase: 26 U/L (ref 11–59)

## 2013-03-17 MED ORDER — ACETAMINOPHEN 325 MG PO TABS
650.0000 mg | ORAL_TABLET | Freq: Four times a day (QID) | ORAL | Status: DC | PRN
  Administered 2013-03-19: 650 mg via ORAL
  Filled 2013-03-17: qty 2

## 2013-03-17 MED ORDER — METOPROLOL SUCCINATE ER 50 MG PO TB24
50.0000 mg | ORAL_TABLET | Freq: Every day | ORAL | Status: DC
  Administered 2013-03-19: 50 mg via ORAL
  Filled 2013-03-17 (×3): qty 1

## 2013-03-17 MED ORDER — LOSARTAN POTASSIUM 50 MG PO TABS: 100.0000 mg | ORAL_TABLET | Freq: Every day | ORAL | Status: DC

## 2013-03-17 MED ORDER — INSULIN ASPART 100 UNIT/ML ~~LOC~~ SOLN
0.0000 [IU] | Freq: Three times a day (TID) | SUBCUTANEOUS | Status: DC
  Administered 2013-03-17 – 2013-03-18 (×3): 2 [IU] via SUBCUTANEOUS

## 2013-03-17 MED ORDER — ADULT MULTIVITAMIN W/MINERALS CH
1.0000 | ORAL_TABLET | Freq: Every day | ORAL | Status: DC
  Administered 2013-03-19: 1 via ORAL
  Filled 2013-03-17 (×2): qty 1

## 2013-03-17 MED ORDER — KCL IN DEXTROSE-NACL 20-5-0.9 MEQ/L-%-% IV SOLN
INTRAVENOUS | Status: DC
  Administered 2013-03-18: via INTRAVENOUS
  Filled 2013-03-17 (×4): qty 1000

## 2013-03-17 MED ORDER — ONDANSETRON HCL 4 MG/2ML IJ SOLN: 4.0000 mg | Freq: Four times a day (QID) | INTRAMUSCULAR | Status: DC | PRN

## 2013-03-17 MED ORDER — ACETAMINOPHEN 650 MG RE SUPP: 650.0000 mg | Freq: Four times a day (QID) | RECTAL | Status: DC | PRN

## 2013-03-17 MED ORDER — LEVOTHYROXINE SODIUM 150 MCG PO TABS
150.0000 ug | ORAL_TABLET | Freq: Every day | ORAL | Status: DC
  Administered 2013-03-19: 150 ug via ORAL
  Filled 2013-03-17 (×2): qty 1

## 2013-03-17 MED ORDER — LOSARTAN POTASSIUM 50 MG PO TABS
100.0000 mg | ORAL_TABLET | Freq: Every day | ORAL | Status: DC
  Administered 2013-03-19: 100 mg via ORAL
  Filled 2013-03-17 (×2): qty 2

## 2013-03-17 MED ORDER — HYDROCODONE-ACETAMINOPHEN 5-325 MG PO TABS: 1.0000 | ORAL_TABLET | ORAL | Status: DC | PRN

## 2013-03-17 MED ORDER — MORPHINE SULFATE 2 MG/ML IJ SOLN
2.0000 mg | INTRAMUSCULAR | Status: DC | PRN
  Administered 2013-03-18: 2 mg via INTRAVENOUS
  Filled 2013-03-17: qty 1

## 2013-03-17 MED ORDER — ENOXAPARIN SODIUM 40 MG/0.4ML ~~LOC~~ SOLN
40.0000 mg | SUBCUTANEOUS | Status: DC
  Filled 2013-03-17 (×2): qty 0.4

## 2013-03-17 MED ORDER — MULTIVITAMINS PO CAPS: 1.0000 | ORAL_CAPSULE | Freq: Every day | ORAL | Status: DC

## 2013-03-17 MED ORDER — HYDROCODONE-ACETAMINOPHEN 5-325 MG PO TABS
1.0000 | ORAL_TABLET | ORAL | Status: DC | PRN
  Administered 2013-03-19: 1 via ORAL
  Filled 2013-03-17: qty 1

## 2013-03-17 MED ORDER — PIPERACILLIN-TAZOBACTAM 3.375 G IVPB
3.3750 g | Freq: Three times a day (TID) | INTRAVENOUS | Status: DC
Start: 2013-03-17 — End: 2013-03-19
  Administered 2013-03-17 – 2013-03-19 (×5): 3.375 g via INTRAVENOUS
  Filled 2013-03-17 (×7): qty 50

## 2013-03-17 NOTE — Progress Notes (Signed)
RT spoke with patient in regards to wearing CPAP. Patient stated that he wears the CPAP sometimes at night at home and didn't feel that he wanted to wear it tonight.  Advised the patient that the doctor had ordered and he stated that he didn't want to wear tonight.  RT advised patient and his family member in the room that if he changes his mind that the RT would be glad to assist with setting him up with CPAP.  

## 2013-03-17 NOTE — Telephone Encounter (Signed)
I called and spoke with spouse. They are already at the ED now. I advised will let SN know. Nothing further needed

## 2013-03-17 NOTE — ED Provider Notes (Signed)
CSN: [id number]     Arrival date & time 03/17/13  1016 History   First MD Initiated Contact with Patient 03/17/13 1056     Chief Complaint  Patient presents with  . Abdominal Pain  . Dizziness   (Consider location/radiation/quality/duration/timing/severity/associated sxs/prior Treatment) Patient is a 63 y.o. male presenting with abdominal pain and dizziness. The history is provided by the patient. No language interpreter was used.  Abdominal Pain Pain location:  RUQ and R flank Pain quality: aching and pressure   Pain radiates to:  Back Pain severity:  Severe Onset quality:  Gradual Duration:  12 hours Timing:  Constant Progression:  Worsening Chronicity:  New Context: not alcohol use, not awakening from sleep, not diet changes, not eating, not laxative use, not previous surgeries, not recent illness, not recent travel and not sick contacts   Relieved by:  Nothing Worsened by:  Nothing tried Ineffective treatments:  None tried Associated symptoms: chills, fatigue and fever   Associated symptoms: no chest pain, no diarrhea and no vomiting   Risk factors: being elderly   Risk factors: no alcohol abuse, no aspirin use, has not had multiple surgeries and no NSAID use   Dizziness Associated symptoms: no chest pain, no diarrhea and no vomiting     Past Medical History  Diagnosis Date  . CAD (coronary artery disease)     DES 07/2004 /   DES LAD 09/2004  /  cath 08/2005 stents patent  /  nuclear 02/2008 no ischemia  . Overweight   . Allergic rhinitis, cause unspecified   . Obstructive sleep apnea (adult) (pediatric)   . Acute bronchitis   . Hypertension   . Dyslipidemia   . Hypothyroidism   . Osteoarthrosis, unspecified whether generalized or localized, unspecified site   . Other abnormal blood chemistry   . Ejection fraction     EF 55% cath, 08/2005  /  EF 60% nuclear, 02/2008   Past Surgical History  Procedure Laterality Date  . Shoulder surgery     Family History  Problem  Relation Age of Onset  . Heart disease Brother    History  Substance Use Topics  . Smoking status: Former Smoker -- 1.00 packs/day for 15 years    Types: Cigarettes    Quit date: 08/23/2004  . Smokeless tobacco: Former Systems developer    Types: Chew     Comment: quit the same day  . Alcohol Use: Yes     Comment: social use    Review of Systems  Constitutional: Positive for fever, chills and fatigue.       Per HPI, otherwise negative  HENT:       Per HPI, otherwise negative  Respiratory:       Per HPI, otherwise negative  Cardiovascular: Negative for chest pain.       Per HPI, otherwise negative  Gastrointestinal: Positive for abdominal pain. Negative for vomiting and diarrhea.  Endocrine:       Negative aside from HPI  Genitourinary:       Neg aside from HPI   Musculoskeletal:       Per HPI, otherwise negative  Skin: Negative.   Neurological: Positive for dizziness. Negative for syncope.    Allergies  Ibuprofen  Home Medications   Current Outpatient Rx  Name  Route  Sig  Dispense  Refill  . aspirin 325 MG tablet   Oral   Take 325 mg by mouth daily.          . fluocinonide-emollient (  LIDEX-E) 0.05 % cream   Topical   Apply 1 application topically 2 (two) times daily.         . fluticasone (FLONASE) 50 MCG/ACT nasal spray   Each Nare   Place 1 spray into both nostrils daily.         Marland Kitchen levothyroxine (SYNTHROID, LEVOTHROID) 150 MCG tablet   Oral   Take 150 mcg by mouth daily before breakfast.         . losartan (COZAAR) 100 MG tablet   Oral   Take 100 mg by mouth daily.         . metFORMIN (GLUCOPHAGE) 500 MG tablet   Oral   Take 500 mg by mouth daily.         . metoprolol succinate (TOPROL-XL) 50 MG 24 hr tablet   Oral   Take 50 mg by mouth daily. Take with or immediately following a meal.         . Multiple Vitamin (MULTIVITAMIN) capsule   Oral   Take 1 capsule by mouth daily.          . Testosterone (ANDROGEL PUMP) 20.25 MG/ACT (1.62%)  GEL      APPLY 2 PUMPS TO SKIN DAILY          BP 111/61  Pulse 62  Temp(Src) 97.8 F (36.6 C) (Oral)  Resp 17  Wt 240 lb (108.863 kg)  SpO2 97% Physical Exam  Nursing note and vitals reviewed. Constitutional: He is oriented to person, place, and time. He appears well-developed. No distress.  HENT:  Head: Normocephalic and atraumatic.  Eyes: Conjunctivae and EOM are normal.  Cardiovascular: Normal rate and regular rhythm.   Pulmonary/Chest: Effort normal. No stridor. No respiratory distress.  Abdominal: He exhibits no distension. There is no tenderness. There is no rebound.  Musculoskeletal: He exhibits no edema.  Neurological: He is alert and oriented to person, place, and time.  Skin: Skin is warm and dry.  Psychiatric: He has a normal mood and affect.    ED Course  Procedures (including critical care time) Labs Review Labs Reviewed  CBC WITH DIFFERENTIAL - Abnormal; Notable for the following:    WBC 12.5 (*)    Neutrophils Relative % 78 (*)    Neutro Abs 9.7 (*)    All other components within normal limits  COMPREHENSIVE METABOLIC PANEL - Abnormal; Notable for the following:    Glucose, Bld 154 (*)    BUN 30 (*)    Alkaline Phosphatase 34 (*)    GFR calc non Af Amer 66 (*)    GFR calc Af Amer 76 (*)    All other components within normal limits  LIPASE, BLOOD  URINALYSIS, ROUTINE W REFLEX MICROSCOPIC  POCT I-STAT TROPONIN I   Imaging Review No results found.  EKG Interpretation   None       3:37 PM Patient appears calm. We discussed his [place] and CT findings. Patient will be evaluated by surgery  MDM  No diagnosis found. Patient presents with new right upper quadrant pain. On exam patient is initially in pain, though this improves here. Patient is stable vital signs, is afebrile. Patient does have leukocytosis and ultrasound concerning for cholecystitis. With this concern for ongoing hepatobiliary dysfunction he will have a surgical consult. On  sign-out, this is pending.     Carmin Muskrat, MD 03/17/13 1539

## 2013-03-17 NOTE — H&P (Signed)
I have seen and examined the patient and agree with the assessment and plans. Plan laparoscopic cholecystectomy with possible cholangiogram tomorrow. I discussed this with the patient and his wife. I discussed the risks which include but are not limited to bleeding, infection, bile duct injury, bile leak, injury to other structures, need to convert to an open procedure, cardiopulmonary problems, et Ronney Asters. He understands and will proceed with surgery tomorrow  Nathaneil Canary A. Ninfa Linden  MD, FACS

## 2013-03-17 NOTE — H&P (Signed)
Chief Complaint: RUQ abdominal pain HPI: [name] is a 63 year old male with a history of CAD, MI s/p stent x2 in 2006, DM, HTN, Hypothyroidism, HLD who presented to [facility] with RUQ abdominal pain. Duration of symptoms 12AM last night.  Onset was sudden.  Coarse is improving, resolved.  Associated with chills, sweats and nausea.  Location is ruq with radiation to the back.  Denies vomiting.  Modifying factors include; ricola lozenge.  No aggravating or alleviating factors.  Time pattern is constant.  Characterized as dull pain with radiation to the back.  Denies fever.  Denies recent ill contacts.  Denies chest pains, shortness of  Breath or palpitations.  He walks every days, walked 3 miles yesterday without an symptoms.  Denies melena or hematochezia.  Denies use of anticoagulation.  He has a normal ECG.  White count of 12.5K, US shows wall thickening.    Past Medical History  Diagnosis Date  . CAD (coronary artery disease)     DES 07/2004 /   DES LAD 09/2004  /  cath 08/2005 stents patent  /  nuclear 02/2008 no ischemia  . Overweight   . Allergic rhinitis, cause unspecified   . Obstructive sleep apnea (adult) (pediatric)   . Acute bronchitis   . Hypertension   . Dyslipidemia   . Hypothyroidism   . Osteoarthrosis, unspecified whether generalized or localized, unspecified site   . Other abnormal blood chemistry   . Ejection fraction     EF 55% cath, 08/2005  /  EF 60% nuclear, 02/2008  . Myocardial infarction     Past Surgical History  Procedure Laterality Date  . Shoulder surgery      Family History  Problem Relation Age of Onset  . Heart disease Brother    Social History:  reports that he quit smoking about 8 years ago. His smoking use included Cigarettes. He has a 15 pack-year smoking history. He has quit using smokeless tobacco. His smokeless tobacco use included Chew. He reports that he drinks alcohol. He reports that he does not use illicit drugs.  Allergies:  Allergies   Allergen Reactions  . Ibuprofen     REACTION: intol to large amounts of ibuprofen     (Not in a hospital admission)  Results for orders placed during the hospital encounter of 03/17/13 (from the past 48 hour(s))  CBC WITH DIFFERENTIAL     Status: Abnormal   Collection Time    03/17/13 10:39 AM      Result Value Range   WBC 12.5 (*) 4.0 - 10.5 K/uL   RBC 4.54  4.22 - 5.81 MIL/uL   Hemoglobin 13.6  13.0 - 17.0 g/dL   HCT 40.3  39.0 - 52.0 %   MCV 88.8  78.0 - 100.0 fL   MCH 30.0  26.0 - 34.0 pg   MCHC 33.7  30.0 - 36.0 g/dL   RDW 13.8  11.5 - 15.5 %   Platelets 251  150 - 400 K/uL   Neutrophils Relative % 78 (*) 43 - 77 %   Neutro Abs 9.7 (*) 1.7 - 7.7 K/uL   Lymphocytes Relative 15  12 - 46 %   Lymphs Abs 1.8  0.7 - 4.0 K/uL   Monocytes Relative 6  3 - 12 %   Monocytes Absolute 0.8  0.1 - 1.0 K/uL   Eosinophils Relative 1  0 - 5 %   Eosinophils Absolute 0.1  0.0 - 0.7 K/uL   Basophils Relative 0  0 - 1 %   Basophils Absolute 0.0  0.0 - 0.1 K/uL  COMPREHENSIVE METABOLIC PANEL     Status: Abnormal   Collection Time    03/17/13 10:39 AM      Result Value Range   Sodium 137  137 - 147 mEq/L   Potassium 4.2  3.7 - 5.3 mEq/L   Chloride 96  96 - 112 mEq/L   CO2 25  19 - 32 mEq/L   Glucose, Bld 154 (*) 70 - 99 mg/dL   BUN 30 (*) 6 - 23 mg/dL   Creatinine, Ser 1.16  0.50 - 1.35 mg/dL   Calcium 9.7  8.4 - 10.5 mg/dL   Total Protein 7.2  6.0 - 8.3 g/dL   Albumin 4.2  3.5 - 5.2 g/dL   AST 16  0 - 37 U/L   ALT 8  0 - 53 U/L   Alkaline Phosphatase 34 (*) 39 - 117 U/L   Total Bilirubin 0.3  0.3 - 1.2 mg/dL   GFR calc non Af Amer 66 (*) >90 mL/min   GFR calc Af Amer 76 (*) >90 mL/min   Comment: (NOTE)     The eGFR has been calculated using the CKD EPI equation.     This calculation has not been validated in all clinical situations.     eGFR's persistently <90 mL/min signify possible Chronic Kidney     Disease.  LIPASE, BLOOD     Status: None   Collection Time    03/17/13  10:39 AM      Result Value Range   Lipase 26  11 - 59 U/L  POCT I-STAT TROPONIN I     Status: None   Collection Time    03/17/13 10:48 AM      Result Value Range   Troponin i, poc 0.00  0.00 - 0.08 ng/mL   Comment 3            Comment: Due to the release kinetics of cTnI,     a negative result within the first hours     of the onset of symptoms does not rule out     myocardial infarction with certainty.     If myocardial infarction is still suspected,     repeat the test at appropriate intervals.  URINALYSIS, ROUTINE W REFLEX MICROSCOPIC     Status: Abnormal   Collection Time    03/17/13 12:08 PM      Result Value Range   Color, Urine YELLOW  YELLOW   APPearance CLEAR  CLEAR   Specific Gravity, Urine 1.035 (*) 1.005 - 1.030   pH 5.5  5.0 - 8.0   Glucose, UA NEGATIVE  NEGATIVE mg/dL   Hgb urine dipstick NEGATIVE  NEGATIVE   Bilirubin Urine NEGATIVE  NEGATIVE   Ketones, ur 15 (*) NEGATIVE mg/dL   Protein, ur NEGATIVE  NEGATIVE mg/dL   Urobilinogen, UA 0.2  0.0 - 1.0 mg/dL   Nitrite NEGATIVE  NEGATIVE   Leukocytes, UA NEGATIVE  NEGATIVE   Comment: MICROSCOPIC NOT DONE ON URINES WITH NEGATIVE PROTEIN, BLOOD, LEUKOCYTES, NITRITE, OR GLUCOSE <1000 mg/dL.   Ct Abdomen Pelvis Wo Contrast  03/17/2013   CLINICAL DATA:  Right upper quadrant/ epigastric pain  EXAM: CT ABDOMEN AND PELVIS WITHOUT CONTRAST  TECHNIQUE: Multidetector CT imaging of the abdomen and pelvis was performed following the standard protocol without intravenous contrast.  COMPARISON:  None.  FINDINGS: Mild dependent atelectasis lung bases.  Coronary atherosclerosis.  Unenhanced liver, spleen,  pancreas, and adrenal glands are within normal limits.  Mild gallbladder distention with possible gallbladder wall thickening, although poorly evaluated on CT.  Suspected punctate nonobstructing right upper pole renal calculus (coronal image 66). Kidneys are otherwise within normal limits. No hydronephrosis.  No evidence of bowel  obstruction. Normal appendix. Colonic diverticulosis, without associated inflammatory changes.  Atherosclerotic calcifications of the abdominal aorta and branch vessels.  No abdominopelvic ascites.  No suspicious abdominopelvic lymphadenopathy.  Prostate is unremarkable.  No ureteral or bladder calculi.  Bladder is mildly thick-walled although underdistended.  Small fat containing right inguinal hernia.  Degenerative changes of the visualized thoracolumbar spine.  IMPRESSION: Mild gallbladder distention with possible gallbladder wall thickening, although poorly evaluated on CT. Consider right upper quadrant ultrasound for further evaluation.  Suspected punctate nonobstructing right upper pole renal calculus. No ureteral or bladder calculi. No hydronephrosis.   Electronically Signed   By: Julian Hy M.D.   On: 03/17/2013 13:14   US Abdomen Limited  03/17/2013   CLINICAL DATA:  Right upper quadrant abdominal pain.  EXAM: US ABDOMEN LIMITED - RIGHT UPPER QUADRANT  COMPARISON:  CT 03/17/2013  FINDINGS: Gallbladder:  Gallbladder is mildly distended and the gallbladder wall is mildly thickened measuring up to 5 mm. Echogenic foci within the gallbladder are suggestive for small stones. Reportedly, the patient does not have a sonographic Murphy's sign. Largest stone roughly measures 6 mm.  Common bile duct:  Diameter: 4 mm.  Liver:  No focal liver abnormality.  The main portal vein is patent.  IMPRESSION: Cholelithiasis with mild gallbladder wall thickening. Findings can be associated with acute cholecystitis.  No evidence for biliary duct dilatation.   Electronically Signed   By: Markus Daft M.D.   On: 03/17/2013 14:58    Review of Systems  All other systems reviewed and are negative.    Blood pressure 115/65, pulse 69, temperature 97.8 F (36.6 C), temperature source Oral, resp. rate 18, weight 240 lb (108.863 kg), SpO2 97.00%. Physical Exam  Constitutional: He is oriented to person, place, and time.  He appears well-developed and well-nourished. No distress.  HENT:  Head: Normocephalic and atraumatic.  Eyes: Conjunctivae and EOM are normal. Pupils are equal, round, and reactive to light. No scleral icterus.  Neck: Neck supple.  Cardiovascular: Normal rate, regular rhythm, normal heart sounds and intact distal pulses.  Exam reveals no gallop and no friction rub.   No murmur heard. Respiratory: Effort normal and breath sounds normal. No respiratory distress. He has no wheezes. He has no rales. He exhibits no tenderness.  GI: Soft. Bowel sounds are normal. He exhibits no distension and no mass. There is no tenderness. There is no rebound and no guarding.  Musculoskeletal: Normal range of motion. He exhibits no edema.  Lymphadenopathy:    He has no cervical adenopathy.  Neurological: He is alert and oriented to person, place, and time.  Skin: Skin is warm and dry. No rash noted. He is not diaphoretic. No erythema. No pallor.  Psychiatric: He has a normal mood and affect. His behavior is normal. Judgment and thought content normal.     Assessment/Plan Early acute cholecystitis  -admit for observation.  Proceed with laparoscopic cholecystectomy in AM.  We discussed in detail surgical risks including, bleeding, infection and death.   -zosyn IV -IV pain meds, anti-emetics -obtain consent -may have clears, NPO after midnight. -repeat labs in AM  DM2 -hold metformin -SSI, SBGs  HTN -continue home meds  Hypothyroidism -home meds  Jerremy Maione, [facility]  ANP-BC Pager [phone] 03/17/2013, 4:35 PM

## 2013-03-17 NOTE — ED Notes (Signed)
Pt is here with Right upper quadrant pain and into epigastric area since last nite.  Pt has history of 2 cardiac stents, reports lightheadedness and diaphoretic.

## 2013-03-18 ENCOUNTER — Observation Stay (HOSPITAL_COMMUNITY): Payer: 59 | Admitting: Anesthesiology

## 2013-03-18 ENCOUNTER — Encounter (HOSPITAL_COMMUNITY): Payer: 59 | Admitting: Anesthesiology

## 2013-03-18 ENCOUNTER — Encounter (HOSPITAL_COMMUNITY): Payer: Self-pay | Admitting: Anesthesiology

## 2013-03-18 ENCOUNTER — Encounter (HOSPITAL_COMMUNITY)
Admission: EM | Disposition: A | Discharge status: Home / Self Care | Payer: Self-pay | Source: Home / Self Care | Attending: Emergency Medicine

## 2013-03-18 DIAGNOSIS — K8010 Calculus of gallbladder with chronic cholecystitis without obstruction: Secondary | ICD-10-CM

## 2013-03-18 HISTORY — PX: CHOLECYSTECTOMY: SHX55

## 2013-03-18 LAB — COMPREHENSIVE METABOLIC PANEL
ALT: 7 U/L (ref 0–53)
AST: 13 U/L (ref 0–37)
Albumin: 3.5 g/dL (ref 3.5–5.2)
Alkaline Phosphatase: 24 U/L — ABNORMAL LOW (ref 39–117)
BUN: 21 mg/dL (ref 6–23)
CO2: 25 meq/L (ref 19–32)
CREATININE: 1.15 mg/dL (ref 0.50–1.35)
Calcium: 8.8 mg/dL (ref 8.4–10.5)
Chloride: 104 mEq/L (ref 96–112)
GFR calc non Af Amer: 66 mL/min — ABNORMAL LOW (ref >90)
GFR, EST AFRICAN AMERICAN: 77 mL/min — AB (ref >90)
GLUCOSE: 114 mg/dL — AB (ref 70–99)
Potassium: 4 mEq/L (ref 3.7–5.3)
Sodium: 142 mEq/L (ref 137–147)
Total Bilirubin: 0.4 mg/dL (ref 0.3–1.2)
Total Protein: 6.2 g/dL (ref 6.0–8.3)

## 2013-03-18 LAB — CBC
HCT: 38.5 % — ABNORMAL LOW (ref 39.0–52.0)
HEMOGLOBIN: 12.8 g/dL — AB (ref 13.0–17.0)
MCH: 29.8 pg (ref 26.0–34.0)
MCHC: 33.2 g/dL (ref 30.0–36.0)
MCV: 89.5 fL (ref 78.0–100.0)
Platelets: 233 10*3/uL (ref 150–400)
RBC: 4.3 MIL/uL (ref 4.22–5.81)
RDW: 14.1 % (ref 11.5–15.5)
WBC: 9.4 10*3/uL (ref 4.0–10.5)

## 2013-03-18 LAB — GLUCOSE, CAPILLARY
GLUCOSE-CAPILLARY: 136 mg/dL — AB (ref 70–99)
Glucose-Capillary: 117 mg/dL — ABNORMAL HIGH (ref 70–99)
Glucose-Capillary: 145 mg/dL — ABNORMAL HIGH (ref 70–99)
Glucose-Capillary: 143 mg/dL — ABNORMAL HIGH (ref 70–99)
Glucose-Capillary: 115 mg/dL — ABNORMAL HIGH (ref 70–99)

## 2013-03-18 LAB — SURGICAL PCR SCREEN
MRSA, PCR: NEGATIVE
Staphylococcus aureus: NEGATIVE

## 2013-03-18 SURGERY — LAPAROSCOPIC CHOLECYSTECTOMY
Anesthesia: General | Site: Abdomen

## 2013-03-18 MED ORDER — LIDOCAINE HCL (CARDIAC) 20 MG/ML IV SOLN
INTRAVENOUS | Status: DC | PRN
  Administered 2013-03-18: 100 mg via INTRAVENOUS

## 2013-03-18 MED ORDER — MIDAZOLAM HCL 2 MG/2ML IJ SOLN
INTRAMUSCULAR | Status: AC
  Filled 2013-03-18: qty 2

## 2013-03-18 MED ORDER — METOCLOPRAMIDE HCL 5 MG/ML IJ SOLN
INTRAMUSCULAR | Status: AC
  Filled 2013-03-18: qty 2

## 2013-03-18 MED ORDER — EPHEDRINE SULFATE 50 MG/ML IJ SOLN
INTRAMUSCULAR | Status: AC
  Filled 2013-03-18: qty 1

## 2013-03-18 MED ORDER — SUCCINYLCHOLINE CHLORIDE 20 MG/ML IJ SOLN
INTRAMUSCULAR | Status: DC | PRN
  Administered 2013-03-18: 140 mg via INTRAVENOUS

## 2013-03-18 MED ORDER — METOCLOPRAMIDE HCL 5 MG/ML IJ SOLN: 10.0000 mg | Freq: Once | INTRAMUSCULAR | Status: DC | PRN

## 2013-03-18 MED ORDER — OXYCODONE HCL 5 MG PO TABS: 5.0000 mg | ORAL_TABLET | Freq: Once | ORAL | Status: DC | PRN

## 2013-03-18 MED ORDER — SODIUM CHLORIDE 0.9 % IR SOLN
Status: DC | PRN
  Administered 2013-03-18: 1000 mL

## 2013-03-18 MED ORDER — HEMOSTATIC AGENTS (NO CHARGE) OPTIME
TOPICAL | Status: DC | PRN
  Administered 2013-03-18: 1 via TOPICAL

## 2013-03-18 MED ORDER — KETOROLAC TROMETHAMINE 30 MG/ML IJ SOLN
INTRAMUSCULAR | Status: AC
  Filled 2013-03-18: qty 1

## 2013-03-18 MED ORDER — LACTATED RINGERS IV SOLN
INTRAVENOUS | Status: DC | PRN
  Administered 2013-03-18: 08:00:00 via INTRAVENOUS

## 2013-03-18 MED ORDER — NEOSTIGMINE METHYLSULFATE 1 MG/ML IJ SOLN
INTRAMUSCULAR | Status: DC | PRN
  Administered 2013-03-18: 3 mg via INTRAVENOUS

## 2013-03-18 MED ORDER — FENTANYL CITRATE 0.05 MG/ML IJ SOLN
INTRAMUSCULAR | Status: AC
  Filled 2013-03-18: qty 5

## 2013-03-18 MED ORDER — METOCLOPRAMIDE HCL 5 MG/ML IJ SOLN
INTRAMUSCULAR | Status: DC | PRN
  Administered 2013-03-18: 10 mg via INTRAVENOUS

## 2013-03-18 MED ORDER — ONDANSETRON HCL 4 MG/2ML IJ SOLN
INTRAMUSCULAR | Status: DC | PRN
  Administered 2013-03-18: 4 mg via INTRAVENOUS

## 2013-03-18 MED ORDER — PHENYLEPHRINE 40 MCG/ML (10ML) SYRINGE FOR IV PUSH (FOR BLOOD PRESSURE SUPPORT)
PREFILLED_SYRINGE | INTRAVENOUS | Status: AC
  Filled 2013-03-18: qty 10

## 2013-03-18 MED ORDER — ROCURONIUM BROMIDE 50 MG/5ML IV SOLN
INTRAVENOUS | Status: AC
  Filled 2013-03-18: qty 1

## 2013-03-18 MED ORDER — ROCURONIUM BROMIDE 100 MG/10ML IV SOLN
INTRAVENOUS | Status: DC | PRN
  Administered 2013-03-18: 20 mg via INTRAVENOUS

## 2013-03-18 MED ORDER — ONDANSETRON HCL 4 MG/2ML IJ SOLN
4.0000 mg | Freq: Four times a day (QID) | INTRAMUSCULAR | Status: DC | PRN
Start: 2013-03-18 — End: 2013-03-19
  Administered 2013-03-18: 4 mg via INTRAVENOUS
  Filled 2013-03-18: qty 2

## 2013-03-18 MED ORDER — BUPIVACAINE-EPINEPHRINE (PF) 0.25% -1:200000 IJ SOLN
INTRAMUSCULAR | Status: AC
  Filled 2013-03-18: qty 30

## 2013-03-18 MED ORDER — FENTANYL CITRATE 0.05 MG/ML IJ SOLN
INTRAMUSCULAR | Status: DC | PRN
  Administered 2013-03-18: 100 ug via INTRAVENOUS
  Administered 2013-03-18 (×3): 50 ug via INTRAVENOUS

## 2013-03-18 MED ORDER — LIDOCAINE HCL (CARDIAC) 20 MG/ML IV SOLN
INTRAVENOUS | Status: AC
  Filled 2013-03-18: qty 5

## 2013-03-18 MED ORDER — MIDAZOLAM HCL 5 MG/5ML IJ SOLN
INTRAMUSCULAR | Status: DC | PRN
  Administered 2013-03-18: 2 mg via INTRAVENOUS

## 2013-03-18 MED ORDER — SODIUM CHLORIDE 0.9 % IV SOLN
INTRAVENOUS | Status: DC
  Administered 2013-03-18 – 2013-03-19 (×2): via INTRAVENOUS

## 2013-03-18 MED ORDER — EPHEDRINE SULFATE 50 MG/ML IJ SOLN
INTRAMUSCULAR | Status: DC | PRN
  Administered 2013-03-18 (×2): 10 mg via INTRAVENOUS

## 2013-03-18 MED ORDER — OXYCODONE HCL 5 MG/5ML PO SOLN: 5.0000 mg | Freq: Once | ORAL | Status: DC | PRN

## 2013-03-18 MED ORDER — HYDROMORPHONE HCL PF 1 MG/ML IJ SOLN: 0.2500 mg | INTRAMUSCULAR | Status: DC | PRN

## 2013-03-18 MED ORDER — BUPIVACAINE-EPINEPHRINE 0.25% -1:200000 IJ SOLN
INTRAMUSCULAR | Status: DC | PRN
  Administered 2013-03-18: 30 mL

## 2013-03-18 MED ORDER — DEXAMETHASONE SODIUM PHOSPHATE 4 MG/ML IJ SOLN
INTRAMUSCULAR | Status: AC
  Filled 2013-03-18: qty 1

## 2013-03-18 MED ORDER — DEXAMETHASONE SODIUM PHOSPHATE 10 MG/ML IJ SOLN
INTRAMUSCULAR | Status: DC | PRN
  Administered 2013-03-18: 4 mg via INTRAVENOUS

## 2013-03-18 MED ORDER — GLYCOPYRROLATE 0.2 MG/ML IJ SOLN
INTRAMUSCULAR | Status: DC | PRN
  Administered 2013-03-18 (×2): 0.4 mg via INTRAVENOUS

## 2013-03-18 MED ORDER — NEOSTIGMINE METHYLSULFATE 1 MG/ML IJ SOLN
INTRAMUSCULAR | Status: AC
  Filled 2013-03-18: qty 10

## 2013-03-18 MED ORDER — PROPOFOL 10 MG/ML IV BOLUS
INTRAVENOUS | Status: DC | PRN
  Administered 2013-03-18: 200 mg via INTRAVENOUS

## 2013-03-18 MED ORDER — PHENYLEPHRINE HCL 10 MG/ML IJ SOLN
INTRAMUSCULAR | Status: DC | PRN
  Administered 2013-03-18: 40 ug via INTRAVENOUS

## 2013-03-18 MED ORDER — GLYCOPYRROLATE 0.2 MG/ML IJ SOLN
INTRAMUSCULAR | Status: AC
  Filled 2013-03-18: qty 4

## 2013-03-18 MED ORDER — ONDANSETRON HCL 4 MG/2ML IJ SOLN
INTRAMUSCULAR | Status: AC
  Filled 2013-03-18: qty 2

## 2013-03-18 MED ORDER — 0.9 % SODIUM CHLORIDE (POUR BTL) OPTIME
TOPICAL | Status: DC | PRN
  Administered 2013-03-18: 1000 mL

## 2013-03-18 MED ORDER — PROPOFOL 10 MG/ML IV BOLUS
INTRAVENOUS | Status: AC
  Filled 2013-03-18: qty 20

## 2013-03-18 SURGICAL SUPPLY — 43 items
APL SKNCLS STERI-STRIP NONHPOA (GAUZE/BANDAGES/DRESSINGS) ×1
APPLIER CLIP 5 13 M/L LIGAMAX5 (MISCELLANEOUS) ×3
APR CLP MED LRG 5 ANG JAW (MISCELLANEOUS) ×1
BAG SPEC RTRVL LRG 6X4 10 (ENDOMECHANICALS) ×1
BANDAGE ADHESIVE 1X3 (GAUZE/BANDAGES/DRESSINGS) ×12 IMPLANT
BENZOIN TINCTURE PRP APPL 2/3 (GAUZE/BANDAGES/DRESSINGS) ×3 IMPLANT
CANISTER SUCTION 2500CC (MISCELLANEOUS) ×3 IMPLANT
CHLORAPREP W/TINT 26ML (MISCELLANEOUS) ×3 IMPLANT
CLIP APPLIE 5 13 M/L LIGAMAX5 (MISCELLANEOUS) ×1 IMPLANT
CLOSURE WOUND 1/2 X4 (GAUZE/BANDAGES/DRESSINGS) ×1
COVER MAYO STAND STRL (DRAPES) ×2 IMPLANT
COVER SURGICAL LIGHT HANDLE (MISCELLANEOUS) ×3 IMPLANT
DECANTER SPIKE VIAL GLASS SM (MISCELLANEOUS) ×3 IMPLANT
DRAPE C-ARM 42X72 X-RAY (DRAPES) IMPLANT
ELECT REM PT RETURN 9FT ADLT (ELECTROSURGICAL) ×3
ELECTRODE REM PT RTRN 9FT ADLT (ELECTROSURGICAL) ×1 IMPLANT
GLOVE BIO SURGEON STRL SZ7.5 (GLOVE) ×4 IMPLANT
GLOVE BIOGEL PI IND STRL 7.5 (GLOVE) IMPLANT
GLOVE BIOGEL PI INDICATOR 7.5 (GLOVE) ×2
GLOVE SURG SIGNA 7.5 PF LTX (GLOVE) ×3 IMPLANT
GOWN STRL NON-REIN LRG LVL3 (GOWN DISPOSABLE) ×9 IMPLANT
GOWN STRL REIN XL XLG (GOWN DISPOSABLE) ×3 IMPLANT
HEMOSTAT SNOW SURGICEL 2X4 (HEMOSTASIS) ×2 IMPLANT
KIT BASIN OR (CUSTOM PROCEDURE TRAY) ×3 IMPLANT
KIT ROOM TURNOVER OR (KITS) ×3 IMPLANT
NS IRRIG 1000ML POUR BTL (IV SOLUTION) ×3 IMPLANT
PAD ARMBOARD 7.5X6 YLW CONV (MISCELLANEOUS) ×3 IMPLANT
POUCH SPECIMEN RETRIEVAL 10MM (ENDOMECHANICALS) ×2 IMPLANT
SCISSORS LAP 5X35 DISP (ENDOMECHANICALS) ×3 IMPLANT
SET CHOLANGIOGRAPH 5 50 .035 (SET/KITS/TRAYS/PACK) IMPLANT
SET IRRIG TUBING LAPAROSCOPIC (IRRIGATION / IRRIGATOR) ×3 IMPLANT
SLEEVE ENDOPATH XCEL 5M (ENDOMECHANICALS) ×6 IMPLANT
SLEEVE SURGEON STRL (DRAPES) ×2 IMPLANT
SPECIMEN JAR SMALL (MISCELLANEOUS) ×3 IMPLANT
STRIP CLOSURE SKIN 1/2X4 (GAUZE/BANDAGES/DRESSINGS) ×1 IMPLANT
SUT MNCRL AB 4-0 PS2 18 (SUTURE) ×2 IMPLANT
SUT MON AB 4-0 PC3 18 (SUTURE) ×3 IMPLANT
TOWEL OR 17X24 6PK STRL BLUE (TOWEL DISPOSABLE) ×3 IMPLANT
TOWEL OR 17X26 10 PK STRL BLUE (TOWEL DISPOSABLE) ×3 IMPLANT
TRAY LAPAROSCOPIC (CUSTOM PROCEDURE TRAY) ×3 IMPLANT
TROCAR XCEL BLUNT TIP 100MML (ENDOMECHANICALS) ×3 IMPLANT
TROCAR XCEL NON-BLD 5MMX100MML (ENDOMECHANICALS) ×3 IMPLANT
WATER STERILE IRR 1000ML POUR (IV SOLUTION) IMPLANT

## 2013-03-18 NOTE — Anesthesia Preprocedure Evaluation (Signed)
Anesthesia Evaluation  Patient identified by MRN, date of birth, ID band Patient awake    Reviewed: Allergy & Precautions, H&P , NPO status , Patient's Chart, lab work & pertinent test results, reviewed documented beta blocker date and time   Airway Mallampati: II TM Distance: >3 FB Neck ROM: full    Dental   Pulmonary asthma , sleep apnea , COPDformer smoker,  breath sounds clear to auscultation        Cardiovascular hypertension, + CAD, + Past MI and + Cardiac Stents Rhythm:regular     Neuro/Psych negative neurological ROS  negative psych ROS   GI/Hepatic Neg liver ROS, Medicated and Controlled,  Endo/Other  diabetes, Oral Hypoglycemic AgentsHypothyroidism   Renal/GU negative Renal ROS  negative genitourinary   Musculoskeletal   Abdominal   Peds  Hematology negative hematology ROS (+)   Anesthesia Other Findings See surgeon's H&P   Reproductive/Obstetrics negative OB ROS                           Anesthesia Physical Anesthesia Plan  ASA: III  Anesthesia Plan: General   Post-op Pain Management:    Induction: Intravenous  Airway Management Planned: Oral ETT  Additional Equipment:   Intra-op Plan:   Post-operative Plan: Extubation in OR  Informed Consent: I have reviewed the patients History and Physical, chart, labs and discussed the procedure including the risks, benefits and alternatives for the proposed anesthesia with the patient or authorized representative who has indicated his/her understanding and acceptance.   Dental Advisory Given  Plan Discussed with: CRNA and Surgeon  Anesthesia Plan Comments:         Anesthesia Quick Evaluation

## 2013-03-18 NOTE — Op Note (Signed)
Laparoscopic Cholecystectomy Procedure Note  Indications: This patient presents with symptomatic gallbladder disease and will undergo laparoscopic cholecystectomy.  Pre-operative Diagnosis: Calculus of gallbladder with acute cholecystitis, without mention of obstruction  Post-operative Diagnosis: Same  Surgeon: Coralie Keens A   Assistants: 0  Anesthesia: General endotracheal anesthesia  ASA Class: 3  Procedure Details  The patient was seen again in the Holding Room. The risks, benefits, complications, treatment options, and expected outcomes were discussed with the patient. The possibilities of reaction to medication, pulmonary aspiration, perforation of viscus, bleeding, recurrent infection, finding a normal gallbladder, the need for additional procedures, failure to diagnose a condition, the possible need to convert to an open procedure, and creating a complication requiring transfusion or operation were discussed with the patient. The likelihood of improving the patient's symptoms with return to their baseline status is good.  The patient and/or family concurred with the proposed plan, giving informed consent. The site of surgery properly noted. The patient was taken to Operating Room, identified as [name] and the procedure verified as Laparoscopic Cholecystectomy with Intraoperative Cholangiogram. A Time Out was held and the above information confirmed.  Prior to the induction of general anesthesia, antibiotic prophylaxis was administered. General endotracheal anesthesia was then administered and tolerated well. After the induction, the abdomen was prepped with Chloraprep and draped in sterile fashion. The patient was positioned in the supine position.  Local anesthetic agent was injected into the skin near the umbilicus and an incision made. We dissected down to the abdominal fascia with blunt dissection.  The fascia was incised vertically and we entered the peritoneal cavity  bluntly.  A pursestring suture of 0-Vicryl was placed around the fascial opening.  The Hasson cannula was inserted and secured with the stay suture.  Pneumoperitoneum was then created with CO2 and tolerated well without any adverse changes in the patient's vital signs. An 11-mm port was placed in the subxiphoid position.  Two 5-mm ports were placed in the right upper quadrant. All skin incisions were infiltrated with a local anesthetic agent before making the incision and placing the trocars.   We positioned the patient in reverse Trendelenburg, tilted slightly to the patient's left.  The gallbladder was identified, the fundus grasped and retracted cephalad. Adhesions were lysed bluntly and with the electrocautery where indicated, taking care not to injure any adjacent organs or viscus. The infundibulum was grasped and retracted laterally, exposing the peritoneum overlying the triangle of Calot. This was then divided and exposed in a blunt fashion. The cystic duct was clearly identified and bluntly dissected circumferentially. A critical view of the cystic duct and cystic artery was obtained.  The cystic duct was then ligated with clips and divided. The cystic artery was, dissected free, ligated with clips and divided as well.   The gallbladder was dissected from the liver bed in retrograde fashion with the electrocautery. The gallbladder was removed and placed in an Endocatch sac. The liver bed was irrigated and inspected. Hemostasis was achieved with the electrocautery. Copious irrigation was utilized and was repeatedly aspirated until clear.  The gallbladder and Endocatch sac were then removed through the umbilical port site.  The pursestring suture was used to close the umbilical fascia.    We again inspected the right upper quadrant for hemostasis.  Pneumoperitoneum was released as we removed the trocars.  4-0 Monocryl was used to close the skin.   Benzoin, steri-strips, and clean dressings were applied.  The patient was then extubated and brought to the recovery  room in stable condition. Instrument, sponge, and needle counts were correct at closure and at the conclusion of the case.   Findings: Cholecystitis with Cholelithiasis  Estimated Blood Loss: Minimal         Drains: 0         Specimens: Gallbladder           Complications: None; patient tolerated the procedure well.         Disposition: PACU - hemodynamically stable.         Condition: stable

## 2013-03-18 NOTE — Anesthesia Postprocedure Evaluation (Signed)
Anesthesia Post Note  Patient: [name]  Procedure(s) Performed: Procedure(s) (LRB): LAPAROSCOPIC CHOLECYSTECTOMY (N/A)  Anesthesia type: General  Patient location: PACU  Post pain: Pain level controlled  Post assessment: Patient's Cardiovascular Status Stable  Last Vitals:  Filed Vitals:   03/18/13 0945  BP: 127/58  Pulse: 68  Temp: 36.5 C  Resp: 12    Post vital signs: Reviewed and stable  Level of consciousness: alert  Complications: No apparent anesthesia complications

## 2013-03-18 NOTE — H&P (View-Only) (Signed)
RT spoke with patient in regards to wearing CPAP. Patient stated that he wears the CPAP sometimes at night at home and didn't feel that he wanted to wear it tonight.  Advised the patient that the doctor had ordered and he stated that he didn't want to wear tonight.  RT advised patient and his family member in the room that if he changes his mind that the RT would be glad to assist with setting him up with CPAP.

## 2013-03-18 NOTE — Progress Notes (Signed)
RT spoke with patient about CPAP. Patient refuses CPAP at this time. RT instructed patient to call if he changed his mind.

## 2013-03-18 NOTE — Progress Notes (Signed)
UR completed 

## 2013-03-18 NOTE — Anesthesia Procedure Notes (Signed)
Procedure Name: Intubation Date/Time: 03/18/2013 8:23 AM Performed by: Manuela Schwartz B Pre-anesthesia Checklist: Patient identified, Emergency Drugs available, Suction available, Patient being monitored and Timeout performed Patient Re-evaluated:Patient Re-evaluated prior to inductionOxygen Delivery Method: Circle system utilized Preoxygenation: Pre-oxygenation with 100% oxygen Intubation Type: IV induction and Rapid sequence Laryngoscope Size: Mac and 3 Grade View: Grade I Tube type: Oral Tube size: 7.5 mm Placement Confirmation: ETT inserted through vocal cords under direct vision,  positive ETCO2 and breath sounds checked- equal and bilateral Secured at: 22 cm Tube secured with: Tape Dental Injury: Teeth and Oropharynx as per pre-operative assessment

## 2013-03-18 NOTE — Interval H&P Note (Signed)
History and Physical Interval Note: no changes. LFT's normal.  Plan lap chole today  03/18/2013 7:41 AM  [name]  has presented today for surgery, with the diagnosis of cholecystitis   The various methods of treatment have been discussed with the patient and family. After consideration of risks, benefits and other options for treatment, the patient has consented to  Procedure(s): LAPAROSCOPIC CHOLECYSTECTOMY (N/A) as a surgical intervention .  The patient's history has been reviewed, patient examined, no change in status, stable for surgery.  I have reviewed the patient's chart and labs.  Questions were answered to the patient's satisfaction.     Ruwayda Curet A

## 2013-03-18 NOTE — Transfer of Care (Signed)
Immediate Anesthesia Transfer of Care Note  Patient: [name]  Procedure(s) Performed: Procedure(s): LAPAROSCOPIC CHOLECYSTECTOMY (N/A)  Patient Location: PACU  Anesthesia Type:General  Level of Consciousness: awake, alert  and oriented  Airway & Oxygen Therapy: Patient Spontanous Breathing and Patient connected to nasal cannula oxygen  Post-op Assessment: Report given to PACU RN and Post -op Vital signs reviewed and stable  Post vital signs: Reviewed and stable  Complications: No apparent anesthesia complications

## 2013-03-19 LAB — GLUCOSE, CAPILLARY: GLUCOSE-CAPILLARY: 89 mg/dL (ref 70–99)

## 2013-03-19 MED ORDER — HYDROCODONE-ACETAMINOPHEN 5-325 MG PO TABS: 1.0000 | ORAL_TABLET | ORAL | Status: DC | PRN

## 2013-03-19 NOTE — Discharge Instructions (Signed)
As discussed, your presentation is most consistent with inflammation of your gallbladder.  It is important that you follow up.  Return here for concerning changes in your condition.   LAPAROSCOPIC SURGERY: POST OP INSTRUCTIONS  1. DIET: Follow a light bland diet the first 24 hours after arrival home, such as soup, liquids, crackers, etc.  Be sure to include lots of fluids daily.  Avoid fast food or heavy meals as your are more likely to get nauseated.  Eat a low fat the next few days after surgery.   2. Take your usually prescribed home medications unless otherwise directed. 3. PAIN CONTROL: a. Pain is best controlled by a usual combination of three different methods TOGETHER: i. Ice/Heat ii. Over the counter pain medication iii. Prescription pain medication b. Most patients will experience some swelling and bruising around the incisions.  Ice packs or heating pads (30-60 minutes up to 6 times a day) will help. Use ice for the first few days to help decrease swelling and bruising, then switch to heat to help relax tight/sore spots and speed recovery.  Some people prefer to use ice alone, heat alone, alternating between ice & heat.  Experiment to what works for you.  Swelling and bruising can take several weeks to resolve.   c. It is helpful to take an over-the-counter pain medication regularly for the first few weeks.  Choose one of the following that works best for you: i. Naproxen (Aleve, etc)  Two 220mg  tabs twice a day ii. Ibuprofen (Advil, etc) Three 200mg  tabs four times a day (every meal & bedtime) iii. Acetaminophen (Tylenol, etc) 500-650mg  four times a day (every meal & bedtime) d. A  prescription for pain medication (such as oxycodone, hydrocodone, etc) should be given to you upon discharge.  Take your pain medication as prescribed.  i. If you are having problems/concerns with the prescription medicine (does not control pain, nausea, vomiting, rash, itching, etc), please call us [phone]  [phone] to see if we need to switch you to a different pain medicine that will work better for you and/or control your side effect better. ii. If you need a refill on your pain medication, please contact your pharmacy.  They will contact our office to request authorization. Prescriptions will not be filled after 5 pm or on week-ends. 4. Avoid getting constipated.  Between the surgery and the pain medications, it is common to experience some constipation.  Increasing fluid intake and taking a fiber supplement (such as Metamucil, Citrucel, FiberCon, MiraLax, etc) 1-2 times a day regularly will usually help prevent this problem from occurring.  A mild laxative (prune juice, Milk of Magnesia, MiraLax, etc) should be taken according to package directions if there are no bowel movements after 48 hours.   5. Watch out for diarrhea.  If you have many loose bowel movements, simplify your diet to bland foods & liquids for a few days.  Stop any stool softeners and decrease your fiber supplement.  Switching to mild anti-diarrheal medications (Kayopectate, Pepto Bismol) can help.  If this worsens or does not improve, please call us. 6. Wash / shower every day.  You may shower over the dressings as they are waterproof.  Continue to shower over incision(s) after the dressing is off. 7. Remove your waterproof bandages 5 days after surgery.  You may leave the incision open to air.  You may replace a dressing/Band-Aid to cover the incision for comfort if you wish.  8. ACTIVITIES as tolerated:   a.  You may resume regular (light) daily activities beginning the next day--such as daily self-care, walking, climbing stairs--gradually increasing activities as tolerated.  If you can walk 30 minutes without difficulty, it is safe to try more intense activity such as jogging, treadmill, bicycling, low-impact aerobics, swimming, etc. b. Save the most intensive and strenuous activity for last such as sit-ups, heavy lifting, contact  sports, etc  Refrain from any heavy lifting or straining until you are off narcotics for pain control.   c. DO NOT PUSH THROUGH PAIN.  Let pain be your guide: If it hurts to do something, don't do it.  Pain is your body warning you to avoid that activity for another week until the pain goes down. d. You may drive when you are no longer taking prescription pain medication, you can comfortably wear a seatbelt, and you can safely maneuver your car and apply brakes. e. Dennis Bast may have sexual intercourse when it is comfortable.  9. FOLLOW UP in our office a. Please call [facility] at [phone] to set up an appointment to see your surgeon in the office for a follow-up appointment approximately 2-3 weeks after your surgery. b. Make sure that you call for this appointment the day you arrive home to insure a convenient appointment time. 10. IF YOU HAVE DISABILITY OR FAMILY LEAVE FORMS, BRING THEM TO THE OFFICE FOR PROCESSING.  DO NOT GIVE THEM TO YOUR DOCTOR.   WHEN TO CALL us [phone]: 1. Poor pain control 2. Reactions / problems with new medications (rash/itching, nausea, etc)  3. Fever over 101.5 F (38.5 C) 4. Inability to urinate 5. Nausea and/or vomiting 6. Worsening swelling or bruising 7. Continued bleeding from incision. 8. Increased pain, redness, or drainage from the incision   The clinic staff is available to answer your questions during regular business hours (8:30am-5pm).  Please dont hesitate to call and ask to speak to one of our nurses for clinical concerns.   If you have a medical emergency, go to the nearest emergency room or call 911.  A surgeon from [facility] is always on call at the [facility], [place], [place], [place], [place]  [address] ? MAIN: [phone] ? TOLL FREE: [phone] ?  FAX [phone] [email or web address]

## 2013-03-19 NOTE — Discharge Summary (Signed)
Physician Discharge Summary  Patient ID: [name] MRN: [id number] DOB/AGE: 1950/06/12 63 y.o.  Admit date: 03/17/2013 Discharge date: 03/19/2013  Admitting Diagnosis: Acute cholecystitis   Discharge Diagnosis Patient Active Problem List   Diagnosis Date Noted  . Acute cholecystitis 03/17/2013  . DM (diabetes mellitus) 08/28/2011  . Hypogonadism, male 08/28/2011  . CAD (coronary artery disease)   . Ejection fraction   . HYPERURICEMIA 08/07/2009  . DEGENERATIVE JOINT DISEASE 08/03/2008  . OVERWEIGHT/OBESITY 05/24/2008  . ALLERGIC RHINITIS 06/26/2007  . OBSTRUCTIVE SLEEP APNEA 04/15/2007  . HYPOTHYROIDISM 03/28/2007  . HYPERLIPIDEMIA 03/28/2007  . HYPERTENSION 03/28/2007  . ASTHMATIC BRONCHITIS, ACUTE 03/28/2007    Consultants none  Imaging: Ct Abdomen Pelvis Wo Contrast  03/17/2013   CLINICAL DATA:  Right upper quadrant/ epigastric pain  EXAM: CT ABDOMEN AND PELVIS WITHOUT CONTRAST  TECHNIQUE: Multidetector CT imaging of the abdomen and pelvis was performed following the standard protocol without intravenous contrast.  COMPARISON:  None.  FINDINGS: Mild dependent atelectasis lung bases.  Coronary atherosclerosis.  Unenhanced liver, spleen, pancreas, and adrenal glands are within normal limits.  Mild gallbladder distention with possible gallbladder wall thickening, although poorly evaluated on CT.  Suspected punctate nonobstructing right upper pole renal calculus (coronal image 66). Kidneys are otherwise within normal limits. No hydronephrosis.  No evidence of bowel obstruction. Normal appendix. Colonic diverticulosis, without associated inflammatory changes.  Atherosclerotic calcifications of the abdominal aorta and branch vessels.  No abdominopelvic ascites.  No suspicious abdominopelvic lymphadenopathy.  Prostate is unremarkable.  No ureteral or bladder calculi.  Bladder is mildly thick-walled although underdistended.  Small fat containing right inguinal hernia.  Degenerative  changes of the visualized thoracolumbar spine.  IMPRESSION: Mild gallbladder distention with possible gallbladder wall thickening, although poorly evaluated on CT. Consider right upper quadrant ultrasound for further evaluation.  Suspected punctate nonobstructing right upper pole renal calculus. No ureteral or bladder calculi. No hydronephrosis.   Electronically Signed   By: Julian Hy M.D.   On: 03/17/2013 13:14   US Abdomen Limited  03/17/2013   CLINICAL DATA:  Right upper quadrant abdominal pain.  EXAM: US ABDOMEN LIMITED - RIGHT UPPER QUADRANT  COMPARISON:  CT 03/17/2013  FINDINGS: Gallbladder:  Gallbladder is mildly distended and the gallbladder wall is mildly thickened measuring up to 5 mm. Echogenic foci within the gallbladder are suggestive for small stones. Reportedly, the patient does not have a sonographic Murphy's sign. Largest stone roughly measures 6 mm.  Common bile duct:  Diameter: 4 mm.  Liver:  No focal liver abnormality.  The main portal vein is patent.  IMPRESSION: Cholelithiasis with mild gallbladder wall thickening. Findings can be associated with acute cholecystitis.  No evidence for biliary duct dilatation.   Electronically Signed   By: Markus Daft M.D.   On: 03/17/2013 14:58    Procedures Laparoscopic cholecystectomy Dr. Ninfa Linden 03/19/13  Hospital Course:  Travaris Kosh is a 63 year old male with a history of CAD, MI s/p stent x2 in 2006, DM, HTN, Hypothyroidism, HLD who presented to [facility] with RUQ abdominal pain, white count of 12.5K, US showed. wall thickening. Patient was admitted and underwent procedure listed above.  Tolerated procedure well and was transferred to the floor.  Diet was advanced as tolerated.  On POD #1, the patient was voiding well, tolerating diet, ambulating well, pain well controlled, vital signs stable, incisions c/d/i and felt stable for discharge home.  Patient will follow up in our office in 3 weeks and knows to call with questions  or  concerns.  Physical Exam: General:  Alert, NAD, pleasant, comfortable Abd:  Soft, ND, mild tenderness, incisions C/D/I    Medication List         ANDROGEL PUMP 20.25 MG/ACT (1.62%) Gel  Generic drug:  Testosterone  APPLY 2 PUMPS TO SKIN DAILY     aspirin 325 MG tablet  Take 325 mg by mouth daily.     fluocinonide-emollient 0.05 % cream  Commonly known as:  LIDEX-E  Apply 1 application topically 2 (two) times daily.     fluticasone 50 MCG/ACT nasal spray  Commonly known as:  FLONASE  Place 1 spray into both nostrils daily.     HYDROcodone-acetaminophen 5-325 MG per tablet  Commonly known as:  NORCO/VICODIN  Take 1 tablet by mouth every 4 (four) hours as needed.     HYDROcodone-acetaminophen 5-325 MG per tablet  Commonly known as:  NORCO/VICODIN  Take 1-2 tablets by mouth every 4 (four) hours as needed for moderate pain.     levothyroxine 150 MCG tablet  Commonly known as:  SYNTHROID, LEVOTHROID  Take 150 mcg by mouth daily before breakfast.     losartan 100 MG tablet  Commonly known as:  COZAAR  Take 100 mg by mouth daily.     metFORMIN 500 MG tablet  Commonly known as:  GLUCOPHAGE  Take 500 mg by mouth daily.     metoprolol succinate 50 MG 24 hr tablet  Commonly known as:  TOPROL-XL  Take 50 mg by mouth daily. Take with or immediately following a meal.     multivitamin capsule  Take 1 capsule by mouth daily.             Follow-up Information   Follow up with Zenovia Jarred, MD.   Specialty:  General Surgery   Contact information:   [address] [place] [address] [phone]       Follow up with [facility] On 04/14/2013. (arrive by 1:30PM for a 2PM appt)    Contact information:   [address] [place]   [place] [address] [phone]       Signed: Erby Pian, [facility] [phone]  03/19/2013, 9:07 AM

## 2013-03-19 NOTE — Progress Notes (Signed)
Patient discharged to home with instructions. 

## 2013-03-20 ENCOUNTER — Encounter (HOSPITAL_COMMUNITY): Payer: Self-pay | Admitting: Surgery

## 2013-04-14 ENCOUNTER — Encounter (INDEPENDENT_AMBULATORY_CARE_PROVIDER_SITE_OTHER): Payer: Commercial Managed Care - PPO

## 2013-05-12 ENCOUNTER — Encounter (INDEPENDENT_AMBULATORY_CARE_PROVIDER_SITE_OTHER): Payer: Self-pay | Admitting: General Surgery

## 2013-05-12 ENCOUNTER — Ambulatory Visit (INDEPENDENT_AMBULATORY_CARE_PROVIDER_SITE_OTHER): Payer: Commercial Managed Care - PPO | Admitting: General Surgery

## 2013-05-12 VITALS — BP 128/76 | HR 75 | Temp 98.0°F | Resp 18 | Ht 72.0 in | Wt 242.0 lb

## 2013-05-12 DIAGNOSIS — K8000 Calculus of gallbladder with acute cholecystitis without obstruction: Secondary | ICD-10-CM

## 2013-05-12 NOTE — Progress Notes (Signed)
[name] [birth year] [id number] 05/12/2013   [name] is a 63 y.o. male who had a laparoscopic cholecystectomy with intraoperative cholangiogram by Dr. Coralie [name].  The pathology report confirmed  Gallbladder - CHRONIC CHOLECYSTITIS AND CHOLELITHIASIS. - THERE IS NO EVIDENCE OF MALIGNANCY..  The patient reports that they are feeling well with normal bowel movements and good appetite.  The pre-operative symptoms of abdominal pain, nausea, and vomiting have resolved.  He continues to have energy issues and cannot work, this is being followed by Dr. Lenna [name].  Physical examination - BP 128/76  Pulse 75  Temp(Src) 98 F (36.7 C)  Resp 18  Ht 6' (1.829 m)  Wt 109.77 kg (242 lb)  BMI 32.81 kg/m2  Incisions appear well-healed with no sign of infection or bleeding.   Abdomen - soft, non-tender  Impression:  s/p laparoscopic cholecystectomy  Plan:  He may resume a regular diet and full activity.  He may follow-up on a PRN basis.

## 2013-05-12 NOTE — Progress Notes (Deleted)
[name] November 13, 1950 [id number] 05/12/2013   History of Present Illness: [name] is a  63 y.o. male who presents today status post lap appy by Dr. Greer Pickerel.  Pathology reveals Gallbladder - CHRONIC CHOLECYSTITIS AND CHOLELITHIASIS. - THERE IS NO EVIDENCE OF MALIGNANCY..  The patient is tolerating a regular diet, having normal bowel movements, has good pain control.  He  is back to most normal activities.   Physical Exam: Abd: soft, nontender, active bowel sounds, nondistended.  All incisions are well healed.  Impression: 1.  Acute appendicitis, s/p lap appy  Plan: He  is able to return to normal activities. He  may follow up on a prn basis.

## 2013-05-12 NOTE — Patient Instructions (Signed)
Call if we can do anything to help you out.

## 2013-06-10 ENCOUNTER — Telehealth: Payer: Self-pay | Admitting: Pulmonary Disease

## 2013-06-10 DIAGNOSIS — Z0000 Encounter for general adult medical examination without abnormal findings: Secondary | ICD-10-CM

## 2013-06-10 NOTE — Telephone Encounter (Signed)
Per 3.26.15 ov w/ SN: Patient Instructions     Today we updated your med list in our EPIC system...  Continue your current medications the same...  Today we did your follow up blood work, and included the [employer] testing...  We will contact you w/ the results when available...  We discussed poss Rheumatology eval & Cardiology recheck for your exercise related muscle symptoms...  Please let me know if you want to proceed w/ these evaluations...  Call for any questions...  Let's plan a follow up visit in 29mo, sooner if needed for problems...   LMOM TCB x1 at home number Called cell phone and spoke with spouse.  Discussed with her SN's retirement from primary care but did advise that pt is not being "dropped from our care" and we will ensure he is taken care of until established with a new PCP.  Wife is okay with this and verbalized her understanding.  She does report that pt needs refill on his Flonase and  Synthroid, pending results of his yearly labs.    I discussed with spouse the different [facility] primary care locations that would be most convenient to pt's home.  She will check with patient to see if he would like to establish with one of these practices or try one closer to home ([place]).  She will let us know.  In the meantime, Flonase has been refilled.  Dr Lenna Gilford please advise if okay for pt to have his routine yearly labs.  Spouse is okay with call back tomorrow.  Last labs: CBCD, BMET, Hepatic, TSH, PSA, A1C, Lyme, Urinalysis

## 2013-06-11 MED ORDER — FLUTICASONE PROPIONATE 50 MCG/ACT NA SUSP: 1.0000 | Freq: Every day | NASAL | Status: AC

## 2013-06-11 NOTE — Telephone Encounter (Signed)
Per SN--  Last seen by [facility] 12/2012 Last seen by SN on 04/2012  SN would be happy to see him for a CPX.  Per SN ok for the refills if needed.  Fasting lab orders have been placed for the pt.  thanks

## 2013-06-11 NOTE — Telephone Encounter (Signed)
Spouse aware and appt scheduled. Nothing further needed

## 2013-06-18 ENCOUNTER — Telehealth: Payer: Self-pay | Admitting: *Deleted

## 2013-06-18 NOTE — Telephone Encounter (Signed)
Received PA for the androgel 1.62%.  Called to initiate this PA and they will have to fax over the form to the fax up front.  Will complete form and get this faxed back once completed.

## 2013-06-18 NOTE — Telephone Encounter (Signed)
Form received and filled out and placed on SN cart to be signed.

## 2013-06-22 ENCOUNTER — Other Ambulatory Visit (INDEPENDENT_AMBULATORY_CARE_PROVIDER_SITE_OTHER): Payer: 59

## 2013-06-22 ENCOUNTER — Encounter: Payer: Self-pay | Admitting: Pulmonary Disease

## 2013-06-22 ENCOUNTER — Ambulatory Visit (INDEPENDENT_AMBULATORY_CARE_PROVIDER_SITE_OTHER): Payer: 59 | Admitting: Pulmonary Disease

## 2013-06-22 VITALS — BP 120/78 | HR 65 | Temp 97.7°F | Ht 73.0 in | Wt 247.2 lb

## 2013-06-22 DIAGNOSIS — E039 Hypothyroidism, unspecified: Secondary | ICD-10-CM

## 2013-06-22 DIAGNOSIS — R7989 Other specified abnormal findings of blood chemistry: Secondary | ICD-10-CM

## 2013-06-22 DIAGNOSIS — E785 Hyperlipidemia, unspecified: Secondary | ICD-10-CM

## 2013-06-22 DIAGNOSIS — E291 Testicular hypofunction: Secondary | ICD-10-CM

## 2013-06-22 DIAGNOSIS — I10 Essential (primary) hypertension: Secondary | ICD-10-CM

## 2013-06-22 DIAGNOSIS — I2510 Atherosclerotic heart disease of native coronary artery without angina pectoris: Secondary | ICD-10-CM

## 2013-06-22 DIAGNOSIS — R21 Rash and other nonspecific skin eruption: Secondary | ICD-10-CM

## 2013-06-22 DIAGNOSIS — E119 Type 2 diabetes mellitus without complications: Secondary | ICD-10-CM

## 2013-06-22 DIAGNOSIS — M1990 Unspecified osteoarthritis, unspecified site: Secondary | ICD-10-CM

## 2013-06-22 LAB — CBC WITH DIFFERENTIAL/PLATELET
Basophils Absolute: 0 10*3/uL (ref 0.0–0.1)
Basophils Relative: 0.3 % (ref 0.0–3.0)
EOS ABS: 0.5 10*3/uL (ref 0.0–0.7)
Eosinophils Relative: 4.7 % (ref 0.0–5.0)
HCT: 42.8 % (ref 39.0–52.0)
Hemoglobin: 14.3 g/dL (ref 13.0–17.0)
Lymphocytes Relative: 22.9 % (ref 12.0–46.0)
Lymphs Abs: 2.4 10*3/uL (ref 0.7–4.0)
MCHC: 33.3 g/dL (ref 30.0–36.0)
MCV: 90 fl (ref 78.0–100.0)
MONOS PCT: 7.6 % (ref 3.0–12.0)
Monocytes Absolute: 0.8 10*3/uL (ref 0.1–1.0)
NEUTROS PCT: 64.5 % (ref 43.0–77.0)
Neutro Abs: 6.7 10*3/uL (ref 1.4–7.7)
PLATELETS: 284 10*3/uL (ref 150.0–400.0)
RBC: 4.75 Mil/uL (ref 4.22–5.81)
RDW: 14.1 % (ref 11.5–14.6)
WBC: 10.4 10*3/uL (ref 4.5–10.5)

## 2013-06-22 LAB — BASIC METABOLIC PANEL
BUN: 24 mg/dL — ABNORMAL HIGH (ref 6–23)
CALCIUM: 9.5 mg/dL (ref 8.4–10.5)
CHLORIDE: 103 meq/L (ref 96–112)
CO2: 29 meq/L (ref 19–32)
Creatinine, Ser: 1 mg/dL (ref 0.4–1.5)
GFR: 80.4 mL/min (ref >60.00)
GLUCOSE: 106 mg/dL — AB (ref 70–99)
Potassium: 4.9 mEq/L (ref 3.5–5.1)
SODIUM: 139 meq/L (ref 135–145)

## 2013-06-22 LAB — HEPATIC FUNCTION PANEL
ALBUMIN: 4.5 g/dL (ref 3.5–5.2)
ALT: 12 U/L (ref 0–53)
AST: 21 U/L (ref 0–37)
Alkaline Phosphatase: 30 U/L — ABNORMAL LOW (ref 39–117)
Bilirubin, Direct: 0.1 mg/dL (ref 0.0–0.3)
Total Bilirubin: 0.6 mg/dL (ref 0.3–1.2)
Total Protein: 7.2 g/dL (ref 6.0–8.3)

## 2013-06-22 LAB — LIPID PANEL
Cholesterol: 199 mg/dL (ref 0–200)
HDL: 50.8 mg/dL (ref >39.00)
LDL Cholesterol: 131 mg/dL — ABNORMAL HIGH (ref 0–99)
Total CHOL/HDL Ratio: 4
Triglycerides: 87 mg/dL (ref 0.0–149.0)
VLDL: 17.4 mg/dL (ref 0.0–40.0)

## 2013-06-22 LAB — HEMOGLOBIN A1C: HEMOGLOBIN A1C: 5.9 % (ref 4.6–6.5)

## 2013-06-22 LAB — TSH: TSH: 2.93 u[IU]/mL (ref 0.35–5.50)

## 2013-06-22 LAB — URIC ACID: Uric Acid, Serum: 7.5 mg/dL (ref 4.0–7.8)

## 2013-06-22 LAB — PSA: PSA: 0.44 ng/mL (ref 0.10–4.00)

## 2013-06-22 NOTE — Telephone Encounter (Signed)
Called [employer] today to check on the status of the PA for the androgel.  They stated that they have only had this for a couple of days and it is about to go into review.  This will take about 2-3 days.  i will follow up with them in the next couple of days if no approval has been sent to [place].

## 2013-06-22 NOTE — Patient Instructions (Signed)
Today we updated your med list in our EPIC system...    Continue your current medications the same...  Today we did your follow up FASTING blood work...    We will contact you w/ the results when available...   We also gave you the stool cards to collect 7 mail to [place] to check for hidden blood...  We will arrange for a dermatology consultation regarding the rash on your hands...  Call for any questions.Marland KitchenMarland Kitchen

## 2013-06-23 ENCOUNTER — Other Ambulatory Visit: Payer: Self-pay | Admitting: Pulmonary Disease

## 2013-06-25 ENCOUNTER — Encounter: Payer: Self-pay | Admitting: Pulmonary Disease

## 2013-06-25 NOTE — Progress Notes (Signed)
Subjective:    Patient ID: [name], male    DOB: June 09, 1950, 63 y.o.   MRN: [id number]  HPI 63 y/o WM here for a follow up visit... he has multiple medical problems as noted below...  ~  August 27, 2011:  1 year ROV & Mukhtar is c/o lack of energy, some weakness, slow decline he says> we discussed checking full labs including potassium, CBC, Thyroid & Testosterone (see results below)...  He is also c/o dry skin on hand w/ some cracking & occas bleeds> we discussed moisturing regimen w/ Lac-hydrin lotion & latex gloves Qhs, avoid water exposure (use playtex gloves etc), if no better then refer to derm...    He saw DrKatz for Cards 5/13> CAD w/ stents placed 2006; Myoview 2010 w/o ischemia; denies CP, palpit, etc & he is active; BP was borderline but they didn't change meds, asked to get on diet, get wt down, he has proven intol to all statins...    We reviewed prob list, meds, xrays and labs> see below>>  EKG 5/13 showed NSR, rate66, WNL, NAD...  CXR 7/13 showed normal heart size, clear lungs, NAD...  LABS 7/13:  FLP- not at goals on diet alone;  Chems- on x BS=112 A1c=7.5;  CBC- wnl;  TSH=0.12 on Levo175;  PSA=0.53;  Testos=189;  Uric=7.9  ~  November 21, 2011:  9mo ROV and [name] has 4 issues to discuss>    Recent URI w/ PND & sore throat> we called in Augmentin 9/3 & symptoms much improved, just lingering post nasal drip & intermitt sore throat;  We discussed Rx w/ Saline during the day, Flonase- 2sp in each nostril Qhs, and MMW as needed... He notes prev reflux symptoms are much improved...    When seen last 7/13 he had BS=112 A1c=7.5 and discussed low carb diet, wt reduction and start Metformin 500mg  Qam; doing well by report, not checking BS at home, and due for f/u labs today...    Similarly his thyroid was over suppressed on Synthroid169mcg/d; we decreased to 170mcg/d 7 f/u labs due now...    Finally his labs showed Low-T at 189 & he was fatigued etc; we started Androgel 1.62%- 2  pumps daily and he reports feeling better overall, more energy, more drive, just no stamina & we discussed the importance of exercise; due for recheck Testos level on the Androgel 1.62%...    We reviewed prob list, meds, xrays and labs> see below for updates >> he refuses flu vaccine...  LABS 9/13:  Chems- ok x BS=140 A1c=7.7;  TSH=0.69 on Levothy150...   ~  May 21, 2012:  50mo ROV & [name] is c/o same vague systemic symptoms that is hard for him to articulate> "like a weird muscle weakness" yet able to walk 12mi & musc testing is wnl 5/5 throughout; "I don't feel like I'm in control of my body" yet neuro exam is wnl, no hx falls, etc; overall this is likely a fatigue related symptom & he notes poor stamina but he says using Androgel regularly & his overall sense of well-being is improved even though Testos level hasn't come up much- offered recheck & consider incr dose to 4 pumps but he declines this intervention; he denies CP, & states his breathing is good; I suggested poss Rheum eval & poss f/u Cardiac eval w/ ischemic work-up & he will consider these options- but he says he wants more definitive testing for Lyme dis having tested neg in the past (no rash, hx mult  tick exposures), but he's done some internet research & he says he feels better when on antibiotics for anything (we will recheck Lyme titer)... We reviewed the following medical problems during today's office visit >>     OSA> Sleep study 2009 w/ AHI=15, desat to 77%, loud snoring; followed by DrClance- on CPAP12 w/ good compliance & excellent response; denies daytime sleepiness issues; seen 11/13 & asked to keep up w/ new supplies & work on wt reduction...    HBP> on MetopER50, Losar100; BP= 142/70 & he denies HA, CP, palpit, SOB, edema...    CAD> on ASA325; Hx IWMI 2006 w/ PTCA & stent to RCA; followed by Delton See; states walking 47mi/d w/o CP etc; he needs to be more active, get on diet, get wt down...    Hyperlipid> on diet alone; not  fasting today for labs; last FLP 7/13 showed TChol 235, TG 234, HDL 53, LDL 139; he continues to decline medication for this problem...    DM> on Metform500Bid; taking med regularly he says & feeling better; wt w/o change ~270#; labs 3/14 showed BS=130, A1c=7.2; Rec same med, better diet, get wt down...    Hypothy> on Synth150; clinically & biochem euthyroid w/ TSH= 1.93    Hypogonad> on Androgel 1.62%- 2pumps; he declines recheck of Testos level & incr dose of topical replacement Rx...    DJD, elev uric> on Flexeril10Tid prn;     Dermatitis> dry skin dermatitis & rec to use moisturizing cream/ Lidex-E/ etc...  We reviewed prob list, meds, xrays and labs> see below for updates >> [name] continues to refuse all vaccinations, refuses colonoscopy, etc...  LABS 3/14:  Chems- OK x BS=130, A1c=7.2;  CBC- wnl;  TSH=1.93 on Synth150;  PSA=0.49...  NOTE> Lyme titers were ordered but never collected...  ~  June 22, 2013:  56mo ROV and [name] had acute cholecystitis 1/15 resulting in Lap Cholecystectomy by DrBlackman; he has recovered & now back to baseline... We reviewed the following medical problems during today's office visit >>     OSA> Sleep study 2009 w/ AHI=15, desat to 77%, loud snoring; followed by DrClance- on CPAP12 w/ good compliance & excellent response; denies daytime sleepiness issues; seen 11/14 & asked to keep up w/ new supplies & work on wt reduction...    HBP> on MetopER50, Losar100; BP= 120/78 & he denies HA, CP, palpit, SOB, edema...    CAD> on ASA325; Hx IWMI 2006 w/ PTCA & stent to RCA; followed by Delton See; states walking 56mi/d w/o CP, palpit, SOB, etc; he needs to be more active, get on diet, get wt down...    Hyperlipid> on diet alone; FLP 4/15 shows TChol 199, TG 87, HDL 51, LDL 131; he continues to decline medication for this problem...    DM> on Metform500Qam (he decr from Bid to Qam on his own when he lost weight); wt is down 22# to 247# (BMI=33); labs 4/15 showed BS=106,  A1c=5.9; Rec same med, continue diet & wt reduction...    Hypothy> on Synth150; clinically & biochem euthyroid w/ TSH= 2.93    Hypogonad> on Androgel 1.62%- 2pumps; he declines recheck of Testos level & just wants to continue the 2pump dose...    DJD, elev uric> on Flexeril10Tid prn; Uric level 4/15 = 7.5, he denies gout attacks, uses OTC anti-inflamm meds prn...    Dermatitis> dry skin dermatitis esp on hands and treated w/ Lidex-E cream & gloves Qhs; we will refer to Derm for their recommendations... We reviewed prob list,  meds, xrays and labs> see below for updates >> He refuses all vaccines; requests med refills for 47mo supplies...   LABS 4/15:  FLP- on diet alone w/ LDL=131;  Chems- ok w/ BS=106 A1c=5.9;  CBC- wnl;  TSH=2.93 on Levo50;  Uric=7.5;  PSA=0.44...          Problem List:   OBSTRUCTIVE SLEEP APNEA (ICD-327.23) - prev ROS was pos for snoring and symptoms suggesting signif OSA... he denied daytime hypersomnolence, but he noted decreased O2 sats at night in the hosp post op!... Sleep Study 2/09 showed AHI= 15, RDI= 16, Desat to 77%, +snoring... started on CPAP 12 by DrClance & using it nightly w/ good response, tolerating well, wife pleased... ~  f/u DrClance 10/10- stable on CPAP, continue same. ~  f/u DrClance 11/11- remains stable on CPAP nightly doing well ~  7/13:  He states using CPAP compliantly, resting well, wakes refreshed, no issues w/ daytime alertness... ~  3/14:  He continues to use his CPAP, doing satis w/o interface issues or prob w/ daytime alertness etc... ~  11/14: he had f/u DrClance> using CPAP compliantly, no mask issues- no changes made...  ASTHMATIC BRONCHITIS, ACUTE (ICD-466.0) - ex-smoker quit in 2006 w/ his MI... no recent exac and he denies cough, sputum, hemoptysis, worsening dyspnea,  wheezing, chest pains, etc... he is on no regular medication, but uses [place] as needed... ~  CXR 6/11 showed clear lungs, WNL.Marland Kitchen.  ~  CXR 7/13 showed normal heart size,  clear lungs, NAD... ~  9/13:  He called w/ URI/ sinus infection & given Augmentin by phone; improved but lingering PND & sore throat- treated w/ Saline, Flonase, MMW...  HYPERTENSION (ICD-401.9) - on TOPROL XL 50mg /d & LOSARTAN 100mg /d... not following any sort of diet and weight is still around 270#... not restricting sodium... ~  6/12:  BP=136/84 on Metoprolol50 & higher at home averaging 150s/80s> pt denies HA, fatigue, visual changes, CP, palipit, dizziness, syncope, dyspnea, edema, etc; we have repeatedly discussed diet rx and salt restriction;  Benay Spice was concerned about his BP & it has not come down, therefore we decided to add LOSARTAN 100mg /d;  he will continue to monitor BP at home... ~  7/13:  BP=138/80 on BBlocker & ARB> denies CP, palpit, dizzy, ch in SOB, edema, etc; we reviewed diet, exercise, wt reduction strategies... ~  9/13:  BP= 128/82 & feeling better overall;  BUN=20, A1c=7.7, K=4.2 ~  3/14: on MetopER50, Losar100; BP= 142/70 & he denies HA, CP, palpit, SOB, edema... ~  4/15: on MetopER50, Losar100; BP= 120/78 & he denies HA, CP, palpit, SOB, edema.  ARTERIOSCLEROTIC HEART DISEASE (ICD-414.00) - on ASA 325mg /d & now off Plavix since 2/12... followed DrKatz and his notes are reviewed... he quit smoking 2006, unfortunately he refuses therapy for his hypercholesterolemia...  ~  he had IWMI 6/06 w/ RCA PTCA & stent... ~  last cath= 7/07 w/ non-obstructive dis in 3 vessels... ~  Myoview 1/10 showed diaph attenuation without ischemia or infarct, EF= 64%... ~  Yearly f/u DrKatz for Cards & his notes are reviewed...  HYPERLIPIDEMIA (ICD-272.4) - pt states intolerant to all statins, and lipid clinic tried Zetia and Welcol- all of which he couldn't tolerate... on diet alone & not doing a very good job ("I'm not paying much attention to my diet")  He refuses to reconsider statin meds. ~  [place] 1/08 showed TChol 225, TG 164, HDL 49, LDL 154 ~  FLP 2/09 showed TChol 238, TG 149, HDL  48, LDL  167 ~  FLP 6/10 showed TChol 241, TGT 143, HDL 52, LDL 179 ~  FLP 6/11 showed TChol 217, TG 201, HDL 48, LDL 151 ~  FLP 6/12 showed TChol 236, TG 148, HDL 58, LDL 174... rec to start Statin, pt refuses all chol meds, will continue "diet" management. ~  FLP 7/13 on diet alone showed TChol 235, TG 234, HDL 53, LDL 139... Still refuses med trial, we discussed low chol, low fat diet... ~  [place] 4/15 on diet alone showed TChol 199, TG 87, HDL 51, LDL 131... Good job w/ wt loss, keep up the good work!  DIABETES MELLITUS >> ELEVATED FASTING BLOOD SUGAR >> new prob 6/12 w/ FBS= 125 & we discussed DM & the need for weight reduction, low carb diet, etc... In lieu of wt reduction we could start Metformin monotherapy but he doesn't want meds & prefers diet alone... ~  7/13:  Labs showed BS= 112, A1c= 7.5.Marland KitchenMarland Kitchen rec to start METFORMIN 500mg  Qam & get on low carb diet, get wt down... ~  9/13:  Feeling better but not checking BS at home; labs showed BS= 140, A1c=7.7 on Metform500/d... rec increase to 500Bid ~  3/14: on Metform500Bid; taking med regularly he says & feeling better; wt w/o change ~270#; labs 3/14 showed BS=130, A1c=7.2; Rec same med, better diet, get wt down... ~  4/15: on Metform500Qam (he decr from Bid to Qam on his own when he lost weight); wt is down 22# to 247# (BMI=33); labs 4/15 showed BS=106, A1c=5.9; Rec same med, continue diet & wt reduction.  HYPOTHYROIDISM (ICD-244.9) - stable on SYNTHROID 119mcg/d... ~  labs 1/08 showed TSH= 0.41... On Synthroid172mcg/d ~  labs 2/09 showed TSH= 0.39 ~  labs 6/10 showed TSH= 1.31... On Synthroid 151mcg/d ~  labs 6/11 showed TSH= 0.44 ~  Labs 6/12 on Levo175 showed TSH= 0.11 & he doesn't want to decr dose... ~  Labs 7/13 on Levo175 showed TSH= 0.12 & rec to decrease SYNTHROID to 165mcg/d. ~  Labs 9/13 on Levo150 showed TSH= 0.69 ~  3/14: on Synth150; clinically & biochem euthyroid w/ TSH= 1.93 ~  Labs 4/15 on Synth150 showed TSH= 2.93  HYPOGONADISM >> pt  presented 7/13 w/ poor energy, weakness, declining libido, etc;  Exam showed sl atrophic right testicle;  Testos level= 189 and he is rec to start ANDROGEL 1.62%- 2 pumps rubbed into the skin daily w/ ROV & recheck labs 17mo... ~  9/13:  Pt feeling better, more energy & drive, but not much stamina; we discussed incr exercise; Labs showed Testos level = 284 (improved & feeling better, continue same). ~  3/14: on Androgel 1.62%- 2pumps; he declines recheck of Testos level & incr dose of topical replacement Rx. ~  4/15: he remains on Androgel 1.62%- 2 pumps daily 7 wants to continue same...  GI >> He had Lap Chole 1/15 by DrBlackman (acute cholecystitis & stones) after presenting w/ abd pain, n/v, etc; he has recovered & back to baseline; unfortunately the experience has NOT convinced him of the value of preventive care & he still refuses Colonoscopy. ~  CT Abd 1/15 showed mild depend atx at lung bases, coronary atherosclerosis, mild GB distention & wall thickening, punctate right upper pole nonobstructing kid stone, diverticulosis, atherosclerotic calcif in abd ao, sm fat containing RIH, degen changes in spine...   DEGENERATIVE JOINT DISEASE (ICD-715.90) - he had right shoulder surg by DrSypher 2008.  HYPERURICEMIA (ICD-790.6) - he had ?gout attack by  his hx & took his father's gout Rx (?what) w/ improvement... ~  labs 6/11 showed Uric = 8.0.Marland KitchenMarland Kitchen rec to start ALLOPURINOL 300mg /d ==> he took it for 32mo & stopped on his own preferring to watch & wait... ~  Labs 7/13 showed Uric= 7.9.Marland KitchenMarland Kitchen He prefers not to restart Allopurinol rx... ~  Labs 4/15 off meds on diet alone showed Uric= 7.5 w/o clinical gout attacks etc...  SURG/OTH PROC NOT CARRIED OUT BECAUSE PTS DECN (ICD-V64.2) - pt has repeatedly refused colonoscopy and we discussed this today- he refuses again and doesn't want appt to talk to Gi or consider alternatives to screening eg. virtual colonoscopy etc... ~  6/12:  Stool cards returned 2/6 pos for  occult blood> he is referred to GI for further eval & colonoscopy (Note- pt refused to sched GI appt & continues to refuse colon check). ~  7/13:  Rectal exam neg & stool brown, heme neg; asked to repeat stool cards annually...   Past Surgical History  Procedure Laterality Date  . Shoulder open rotator cuff repair Left ~ 2008  . Coronary angioplasty with stent placement  2006 X2    "1 + 1" (03/17/2013)  . Cardiac catheterization  ?2007  . Cholecystectomy N/A 03/18/2013    Procedure: LAPAROSCOPIC CHOLECYSTECTOMY;  Surgeon: [name], MD;  Location: [facility];  Service: General;  Laterality: N/A;    Outpatient Encounter Prescriptions as of 06/22/2013  Medication Sig  . aspirin 325 MG tablet Take 325 mg by mouth daily.   . fluocinonide-emollient (LIDEX-E) 0.05 % cream Apply 1 application topically 2 (two) times daily.  . fluticasone (FLONASE) 50 MCG/ACT nasal spray Place 1 spray into both nostrils daily.  Marland Kitchen levothyroxine (SYNTHROID, LEVOTHROID) 150 MCG tablet Take 150 mcg by mouth daily before breakfast.  . losartan (COZAAR) 100 MG tablet Take 100 mg by mouth daily.  . metFORMIN (GLUCOPHAGE) 500 MG tablet Take 500 mg by mouth daily.  . metoprolol succinate (TOPROL-XL) 50 MG 24 hr tablet Take 50 mg by mouth daily. Take with or immediately following a meal.  . Multiple Vitamin (MULTIVITAMIN) capsule Take 1 capsule by mouth daily.   . Testosterone (ANDROGEL PUMP) 20.25 MG/ACT (1.62%) GEL APPLY 2 PUMPS TO SKIN DAILY  . [DISCONTINUED] HYDROcodone-acetaminophen (NORCO/VICODIN) 5-325 MG per tablet Take 1 tablet by mouth every 4 (four) hours as needed.  . [DISCONTINUED] HYDROcodone-acetaminophen (NORCO/VICODIN) 5-325 MG per tablet Take 1-2 tablets by mouth every 4 (four) hours as needed for moderate pain.    Allergies  Allergen Reactions  . Ibuprofen     REACTION: intol to large amounts of ibuprofen    Current Medications, Allergies, Past Medical History, Past Surgical History, Family  History, and Social History were reviewed in [employer] record.   Review of Systems    The patient denies fever, chills, sweats, anorexia, fatigue, weakness, malaise, weight loss, sleep disorder, blurring, diplopia, eye irritation, eye discharge, vision loss, eye pain, photophobia, earache, ear discharge, tinnitus, decreased hearing, nasal congestion, nosebleeds, sore throat, hoarseness, chest pain, palpitations, syncope, orthopnea, PND, peripheral edema, cough, dyspnea at rest, excessive sputum, hemoptysis, wheezing, pleurisy, nausea, vomiting, diarrhea, constipation, change in bowel habits, abdominal pain, melena, hematochezia, jaundice, gas/bloating, indigestion/heartburn, dysphagia, odynophagia, dysuria, hematuria, urinary frequency, urinary hesitancy, nocturia, incontinence, back pain, joint pain, joint swelling, muscle cramps, muscle weakness, stiffness, arthritis, sciatica, restless legs, leg pain at night, leg pain with exertion, rash, itching, dryness, suspicious lesions, paralysis, paresthesias, seizures, tremors, vertigo, transient blindness, frequent falls, frequent headaches, difficulty  walking, depression, anxiety, memory loss, confusion, cold intolerance, heat intolerance, polydipsia, polyphagia, polyuria, unusual weight change, abnormal bruising, bleeding, enlarged lymph nodes, urticaria, allergic rash, hay fever, and recurrent infections.     Objective:   Physical Exam     WD, WN, 63 y/o WM in NAD GENERAL:  Alert & oriented; pleasant & cooperative... HEENT:  [place]/AT, EOM-wnl, PERRLA, EACs-clear, TMs-wnl, NOSE-clear, THROAT-clear & wnl. NECK:  Supple w/ fairROM; no JVD; normal carotid impulses w/o bruits; no thyromegaly or nodules palpated; no lymphadenopathy. CHEST:  Clear to P & A; without wheezes/ rales/ or rhonchi. HEART:  Regular Rhythm; without murmurs/ rubs/ or gallops heard... ABDOMEN:  Soft & nontender; normal bowel sounds; no organomegaly or masses  detected... (RECTAL:  Atrophic right testicle, normal left; Rectal neg- stool heme neg; Prostate 3+ smooth...) EXT: without deformities, mild arthritic changes; no varicose veins/ venous insuffic/ or edema. NEURO:  CN's intact; motor testing normal; sensory testing normal; gait normal & balance OK. DERM:  no lesions seen...  RADIOLOGY DATA:  Reviewed in the EPIC EMR & discussed w/ the patient...  LABORATORY DATA:  Reviewed in the EPIC EMR & discussed w/ the patient...   Assessment & Plan:    OSA>  Followed by DrClance; stable on CPAP & uses it compliantly; he really likes this Rx & it has had a big impact...  AB>  At baseline w/o URI symptoms...  HBP>  BP stable on BBlocker & ARB Rx; he needs better diet & wt reduction; ok to continue same meds for now...  CAD>  Followed by Benay Spice on ASA, BBlocker, Diet Rx for Chol (see below); he needs to be more active & get wt down...  HYPERLIPIDEMIA>  FLP is not at goals; he is INTOL to all Chol/ Lipid meds & refuses to reconsider/ lipid clinic/ etc; on diet alone but not really on diet; he was prev counselled by the [facility]; we reviewed low chol/ low fat diet & need to continue wt loss...  DM>  on METFORMIN 500mg Qd & A1c improved to 5.9 w/ his wt loss- keep up the good work....  HYPOTHYROID>  On Synthroid 17mcg/d & TSH is wnl...  HYPOGONADISM>  His weakness/ lack of energy can be explained by Low-T; he has atrophic right testicle, Rx w/ ANDROGEL 1.62%- 2pumps daily & Testos level improved to 284 & clinically feeling better (doesn't want to incr dose)...  DJD/ Hyperuricemia>  Off Allopurinol in 2011 on his own; no recurrence of gout & he prefers to use OTC anti-inflamm Rx as needed; Uric= 7.5  He has repeatedly & consisently refused to consider screening colonoscopy; offered appt w/ GI to discuss & offered alternative screening procedures but he is not interested & continues to decline eval... NOTE: 2/6 stool cards pos 6/12 & we begged him to go to GI  for colonoscopy & he still refuses...   Patient's Medications  New Prescriptions   No medications on file  Previous Medications   ASPIRIN 325 MG TABLET    Take 325 mg by mouth daily.    FLUOCINONIDE-EMOLLIENT (LIDEX-E) 0.05 % CREAM    Apply 1 application topically 2 (two) times daily.   FLUTICASONE (FLONASE) 50 MCG/ACT NASAL SPRAY    Place 1 spray into both nostrils daily.   LEVOTHYROXINE (SYNTHROID, LEVOTHROID) 150 MCG TABLET    Take 150 mcg by mouth daily before breakfast.   LOSARTAN (COZAAR) 100 MG TABLET    Take 100 mg by mouth daily.   METFORMIN (GLUCOPHAGE) 500 MG TABLET  Take 500 mg by mouth daily.   METOPROLOL SUCCINATE (TOPROL-XL) 50 MG 24 HR TABLET    Take 50 mg by mouth daily. Take with or immediately following a meal.   MULTIPLE VITAMIN (MULTIVITAMIN) CAPSULE    Take 1 capsule by mouth daily.    TESTOSTERONE (ANDROGEL PUMP) 20.25 MG/ACT (1.62%) GEL    APPLY 2 PUMPS TO SKIN DAILY  Modified Medications   No medications on file  Discontinued Medications   HYDROCODONE-ACETAMINOPHEN (NORCO/VICODIN) 5-325 MG PER TABLET    Take 1 tablet by mouth every 4 (four) hours as needed.   HYDROCODONE-ACETAMINOPHEN (NORCO/VICODIN) 5-325 MG PER TABLET    Take 1-2 tablets by mouth every 4 (four) hours as needed for moderate pain.

## 2013-07-07 NOTE — Telephone Encounter (Signed)
Called [facility] to check on the status of the PA for the androgel 1.62%  2 pumps daily.  Was advised that this was never received and that the form will need to be refaxed.  This has been refaxed today.

## 2013-07-09 NOTE — Telephone Encounter (Signed)
Spoke with spouse and notified that the PA form had to be refaxed on 07/07/13 and we have not heard anything back yet (I checked Leigh's desk)  I advised will forward back to Leigh to let her know once decision is received  She verbalized understanding and nothing further needed

## 2013-07-09 NOTE — Telephone Encounter (Signed)
Spouse returned call & can be reached at [phone] and [phone].  Satira Anis

## 2013-07-09 NOTE — Telephone Encounter (Signed)
Msg closed in error and sent back to [facility]

## 2013-07-09 NOTE — Telephone Encounter (Signed)
LMTCB

## 2013-07-09 NOTE — Telephone Encounter (Signed)
Wife, Caren Griffins, calling to check on the status of PA for androgel.  [phone]

## 2013-07-16 MED ORDER — TESTOSTERONE 20.25 MG/ACT (1.62%) TD GEL: TRANSDERMAL | Status: DC

## 2013-07-16 NOTE — Telephone Encounter (Addendum)
Called and spoke with [facility] and they stated that the androgel has been approved on 07/09/2013 x 12 months.  Called and spoke with the pharmacy and they are aware that the PA for the androgel has been approved.  rx has been printed out and will be faxed to the pharmacy.  Nothing further is needed.

## 2013-07-16 NOTE — Addendum Note (Signed)
Addended by: Elie Confer on: 07/16/2013 10:27 AM   Modules accepted: Orders

## 2013-08-17 ENCOUNTER — Other Ambulatory Visit: Payer: Self-pay | Admitting: Pulmonary Disease

## 2013-08-20 NOTE — Telephone Encounter (Signed)
error 

## 2013-09-25 ENCOUNTER — Other Ambulatory Visit: Payer: Self-pay | Admitting: Pulmonary Disease

## 2013-12-31 ENCOUNTER — Ambulatory Visit: Payer: 59 | Admitting: Pulmonary Disease

## 2014-01-05 ENCOUNTER — Encounter: Payer: Self-pay | Admitting: Pulmonary Disease

## 2014-01-05 ENCOUNTER — Ambulatory Visit (INDEPENDENT_AMBULATORY_CARE_PROVIDER_SITE_OTHER): Payer: 59 | Admitting: Pulmonary Disease

## 2014-01-05 VITALS — BP 120/68 | HR 71 | Temp 97.0°F | Ht 73.0 in | Wt 251.0 lb

## 2014-01-05 DIAGNOSIS — G4733 Obstructive sleep apnea (adult) (pediatric): Secondary | ICD-10-CM

## 2014-01-05 NOTE — Progress Notes (Signed)
   Subjective:    Patient ID: [name], male    DOB: 1950/12/30, 63 y.o.   MRN: [id number]  HPI The patient comes in today for follow-up of his obstructive sleep apnea. He has been unable to wear C Pap compliantly due to mask fit, and is very frustrated with the device. He also has dentures, and therefore is not a good candidate for a dental appliance.   Review of Systems  Constitutional: Negative for fever and unexpected weight change.  HENT: Negative for congestion, dental problem, ear pain, nosebleeds, postnasal drip, rhinorrhea, sinus pressure, sneezing, sore throat and trouble swallowing.   Eyes: Negative for redness and itching.  Respiratory: Negative for cough, chest tightness, shortness of breath and wheezing.   Cardiovascular: Negative for palpitations and leg swelling.  Gastrointestinal: Negative for nausea and vomiting.  Genitourinary: Negative for dysuria.  Musculoskeletal: Negative for joint swelling.  Skin: Negative for rash.  Neurological: Negative for headaches.  Hematological: Does not bruise/bleed easily.  Psychiatric/Behavioral: Negative for dysphoric mood. The patient is not nervous/anxious.        Objective:   Physical Exam Overweight male in no acute distress Nose without purulence or discharge noted Neck without lymphadenopathy or thyromegaly Lower extremities with no significant edema, no cyanosis Alert and oriented, moves all 4 extremities.      Assessment & Plan:

## 2014-01-05 NOTE — Assessment & Plan Note (Signed)
The patient has been completely intolerant of sleep apnea because of ongoing mask issues. He is very frustrated with it, and would like to stay off the device currently. He has mild sleep apnea, and therefore it is not a significant cardiovascular risk for him. I have encouraged him to work aggressively on weight loss, and avoid sleeping in the supine position. We talked briefly about a dental appliance, but he has dentures. I have also left the door open if he wishes to try CPAP again with some of the newer masks

## 2014-01-05 NOTE — Patient Instructions (Signed)
Discontinue cpap, and keep working on weight loss Avoid sleeping on your back. Please call if you are having increased issues and would like to try cpap again with some of the newer masks.

## 2014-01-18 ENCOUNTER — Other Ambulatory Visit: Payer: Self-pay | Admitting: Pulmonary Disease

## 2014-01-27 ENCOUNTER — Other Ambulatory Visit: Payer: Self-pay | Admitting: Pulmonary Disease

## 2014-02-05 ENCOUNTER — Other Ambulatory Visit: Payer: Self-pay | Admitting: Pulmonary Disease

## 2014-02-05 MED ORDER — ANDROGEL PUMP 20.25 MG/ACT (1.62%) TD GEL: TRANSDERMAL | Status: DC

## 2014-08-17 ENCOUNTER — Other Ambulatory Visit: Payer: Self-pay | Admitting: Pulmonary Disease

## 2014-09-27 ENCOUNTER — Other Ambulatory Visit: Payer: Self-pay | Admitting: Pulmonary Disease

## 2015-03-16 DIAGNOSIS — I2510 Atherosclerotic heart disease of native coronary artery without angina pectoris: Secondary | ICD-10-CM | POA: Diagnosis not present

## 2015-03-16 DIAGNOSIS — R7301 Impaired fasting glucose: Secondary | ICD-10-CM | POA: Diagnosis not present

## 2015-03-16 DIAGNOSIS — E785 Hyperlipidemia, unspecified: Secondary | ICD-10-CM | POA: Diagnosis not present

## 2015-03-16 DIAGNOSIS — Z6833 Body mass index (BMI) 33.0-33.9, adult: Secondary | ICD-10-CM | POA: Diagnosis not present

## 2015-03-16 DIAGNOSIS — E039 Hypothyroidism, unspecified: Secondary | ICD-10-CM | POA: Diagnosis not present

## 2015-03-16 DIAGNOSIS — E291 Testicular hypofunction: Secondary | ICD-10-CM | POA: Diagnosis not present

## 2015-03-16 DIAGNOSIS — S8011XA Contusion of right lower leg, initial encounter: Secondary | ICD-10-CM | POA: Diagnosis not present

## 2015-03-16 DIAGNOSIS — R21 Rash and other nonspecific skin eruption: Secondary | ICD-10-CM | POA: Diagnosis not present

## 2015-03-16 DIAGNOSIS — E669 Obesity, unspecified: Secondary | ICD-10-CM | POA: Diagnosis not present

## 2015-03-16 DIAGNOSIS — Z125 Encounter for screening for malignant neoplasm of prostate: Secondary | ICD-10-CM | POA: Diagnosis not present

## 2015-03-16 MED FILL — CLOPIDOGREL 75 MG TABLET: 75 | 90 days supply | Qty: 90 | Fill #0

## 2015-03-16 MED FILL — DOXYCYCLINE HYCLATE 100 MG: 100 | 30 days supply | Qty: 60 | Fill #0

## 2015-04-22 ENCOUNTER — Other Ambulatory Visit: Payer: Self-pay | Admitting: Pulmonary Disease

## 2015-05-12 MED FILL — LEVOTHYROXINE 150 MCG TAB: 150 | 90 days supply | Qty: 90 | Fill #3

## 2015-05-17 DIAGNOSIS — E291 Testicular hypofunction: Secondary | ICD-10-CM | POA: Diagnosis not present

## 2015-05-17 DIAGNOSIS — R21 Rash and other nonspecific skin eruption: Secondary | ICD-10-CM | POA: Diagnosis not present

## 2015-05-17 DIAGNOSIS — Z6834 Body mass index (BMI) 34.0-34.9, adult: Secondary | ICD-10-CM | POA: Diagnosis not present

## 2015-05-17 DIAGNOSIS — E119 Type 2 diabetes mellitus without complications: Secondary | ICD-10-CM | POA: Diagnosis not present

## 2015-05-17 DIAGNOSIS — M25562 Pain in left knee: Secondary | ICD-10-CM | POA: Diagnosis not present

## 2015-05-17 DIAGNOSIS — M79604 Pain in right leg: Secondary | ICD-10-CM | POA: Diagnosis not present

## 2015-05-17 MED FILL — ACCU-CHEK SOFTCLIX LANCETS: 90 days supply | Qty: 100 | Fill #0

## 2015-05-17 MED FILL — DOXYCYCLINE HYCLATE 100 MG: 100 | 90 days supply | Qty: 90 | Fill #0

## 2015-05-17 MED FILL — ACCU-CHEK AVIVA PLUS TEST S: 90 days supply | Qty: 100 | Fill #0

## 2015-05-17 MED FILL — MELOXICAM 15 MG TABLET: 15 | 90 days supply | Qty: 90 | Fill #0

## 2015-06-30 DIAGNOSIS — H524 Presbyopia: Secondary | ICD-10-CM | POA: Diagnosis not present

## 2015-06-30 DIAGNOSIS — H43811 Vitreous degeneration, right eye: Secondary | ICD-10-CM | POA: Diagnosis not present

## 2015-07-01 MED FILL — DICLOFENAC 0.1% EYE DROPS: 0.1 | 30 days supply | Qty: 5 | Fill #0

## 2015-07-21 DIAGNOSIS — H43811 Vitreous degeneration, right eye: Secondary | ICD-10-CM | POA: Diagnosis not present

## 2015-08-10 MED FILL — LEVOTHYROXINE 150 MCG TAB: 150 | 90 days supply | Qty: 90 | Fill #0

## 2015-08-24 DIAGNOSIS — E785 Hyperlipidemia, unspecified: Secondary | ICD-10-CM | POA: Diagnosis not present

## 2015-08-24 DIAGNOSIS — E119 Type 2 diabetes mellitus without complications: Secondary | ICD-10-CM | POA: Diagnosis not present

## 2015-08-24 DIAGNOSIS — E039 Hypothyroidism, unspecified: Secondary | ICD-10-CM | POA: Diagnosis not present

## 2015-08-24 DIAGNOSIS — M75101 Unspecified rotator cuff tear or rupture of right shoulder, not specified as traumatic: Secondary | ICD-10-CM | POA: Diagnosis not present

## 2015-08-24 DIAGNOSIS — Z1389 Encounter for screening for other disorder: Secondary | ICD-10-CM | POA: Diagnosis not present

## 2015-08-24 DIAGNOSIS — E291 Testicular hypofunction: Secondary | ICD-10-CM | POA: Diagnosis not present

## 2015-08-24 DIAGNOSIS — Z6836 Body mass index (BMI) 36.0-36.9, adult: Secondary | ICD-10-CM | POA: Diagnosis not present

## 2015-08-26 DIAGNOSIS — E291 Testicular hypofunction: Secondary | ICD-10-CM | POA: Diagnosis not present

## 2015-09-26 DIAGNOSIS — E291 Testicular hypofunction: Secondary | ICD-10-CM | POA: Diagnosis not present

## 2015-10-24 DIAGNOSIS — E291 Testicular hypofunction: Secondary | ICD-10-CM | POA: Diagnosis not present

## 2015-11-08 MED FILL — LEVOTHYROXINE 150 MCG TAB: 150 | 90 days supply | Qty: 90 | Fill #0

## 2015-11-25 DIAGNOSIS — E291 Testicular hypofunction: Secondary | ICD-10-CM | POA: Diagnosis not present

## 2015-11-30 DIAGNOSIS — E785 Hyperlipidemia, unspecified: Secondary | ICD-10-CM | POA: Diagnosis not present

## 2015-11-30 DIAGNOSIS — E119 Type 2 diabetes mellitus without complications: Secondary | ICD-10-CM | POA: Diagnosis not present

## 2015-11-30 DIAGNOSIS — M797 Fibromyalgia: Secondary | ICD-10-CM | POA: Diagnosis not present

## 2015-11-30 DIAGNOSIS — G4733 Obstructive sleep apnea (adult) (pediatric): Secondary | ICD-10-CM | POA: Diagnosis not present

## 2015-11-30 DIAGNOSIS — E039 Hypothyroidism, unspecified: Secondary | ICD-10-CM | POA: Diagnosis not present

## 2015-11-30 DIAGNOSIS — I2510 Atherosclerotic heart disease of native coronary artery without angina pectoris: Secondary | ICD-10-CM | POA: Diagnosis not present

## 2015-11-30 DIAGNOSIS — E291 Testicular hypofunction: Secondary | ICD-10-CM | POA: Diagnosis not present

## 2015-11-30 DIAGNOSIS — Z6833 Body mass index (BMI) 33.0-33.9, adult: Secondary | ICD-10-CM | POA: Diagnosis not present

## 2015-12-26 DIAGNOSIS — E291 Testicular hypofunction: Secondary | ICD-10-CM | POA: Diagnosis not present

## 2016-01-26 DIAGNOSIS — E291 Testicular hypofunction: Secondary | ICD-10-CM | POA: Diagnosis not present

## 2016-02-06 MED FILL — LEVOTHYROXINE 150 MCG TAB: 150 | 90 days supply | Qty: 90 | Fill #1

## 2016-02-24 DIAGNOSIS — E291 Testicular hypofunction: Secondary | ICD-10-CM | POA: Diagnosis not present

## 2016-03-01 DIAGNOSIS — E785 Hyperlipidemia, unspecified: Secondary | ICD-10-CM | POA: Diagnosis not present

## 2016-03-01 DIAGNOSIS — E119 Type 2 diabetes mellitus without complications: Secondary | ICD-10-CM | POA: Diagnosis not present

## 2016-03-01 DIAGNOSIS — E039 Hypothyroidism, unspecified: Secondary | ICD-10-CM | POA: Diagnosis not present

## 2016-03-01 DIAGNOSIS — Z125 Encounter for screening for malignant neoplasm of prostate: Secondary | ICD-10-CM | POA: Diagnosis not present

## 2016-03-01 DIAGNOSIS — R7989 Other specified abnormal findings of blood chemistry: Secondary | ICD-10-CM | POA: Diagnosis not present

## 2016-03-01 DIAGNOSIS — I2510 Atherosclerotic heart disease of native coronary artery without angina pectoris: Secondary | ICD-10-CM | POA: Diagnosis not present

## 2016-03-06 MED FILL — LEVOTHYROXINE 175 MCG TAB: 175 | 30 days supply | Qty: 30 | Fill #0

## 2016-03-27 DIAGNOSIS — R7989 Other specified abnormal findings of blood chemistry: Secondary | ICD-10-CM | POA: Diagnosis not present

## 2016-04-09 DIAGNOSIS — E039 Hypothyroidism, unspecified: Secondary | ICD-10-CM | POA: Diagnosis not present

## 2016-04-10 MED FILL — LEVOTHYROXINE 175 MCG TAB: 175 | 90 days supply | Qty: 90 | Fill #0

## 2016-04-26 DIAGNOSIS — E291 Testicular hypofunction: Secondary | ICD-10-CM | POA: Diagnosis not present

## 2016-05-28 DIAGNOSIS — E291 Testicular hypofunction: Secondary | ICD-10-CM | POA: Diagnosis not present

## 2016-06-01 DIAGNOSIS — E291 Testicular hypofunction: Secondary | ICD-10-CM | POA: Diagnosis not present

## 2016-06-01 DIAGNOSIS — E039 Hypothyroidism, unspecified: Secondary | ICD-10-CM | POA: Diagnosis not present

## 2016-06-01 DIAGNOSIS — E119 Type 2 diabetes mellitus without complications: Secondary | ICD-10-CM | POA: Diagnosis not present

## 2016-06-01 DIAGNOSIS — I2510 Atherosclerotic heart disease of native coronary artery without angina pectoris: Secondary | ICD-10-CM | POA: Diagnosis not present

## 2016-06-01 DIAGNOSIS — Z9181 History of falling: Secondary | ICD-10-CM | POA: Diagnosis not present

## 2016-06-01 DIAGNOSIS — E785 Hyperlipidemia, unspecified: Secondary | ICD-10-CM | POA: Diagnosis not present

## 2016-06-01 DIAGNOSIS — Z6834 Body mass index (BMI) 34.0-34.9, adult: Secondary | ICD-10-CM | POA: Diagnosis not present

## 2016-06-27 DIAGNOSIS — E291 Testicular hypofunction: Secondary | ICD-10-CM | POA: Diagnosis not present

## 2016-07-05 MED FILL — LEVOTHYROXINE 175 MCG TAB: 175 | 90 days supply | Qty: 90 | Fill #1

## 2016-07-17 DIAGNOSIS — H524 Presbyopia: Secondary | ICD-10-CM | POA: Diagnosis not present

## 2016-07-17 DIAGNOSIS — H43811 Vitreous degeneration, right eye: Secondary | ICD-10-CM | POA: Diagnosis not present

## 2016-07-30 DIAGNOSIS — E291 Testicular hypofunction: Secondary | ICD-10-CM | POA: Diagnosis not present

## 2016-08-01 DIAGNOSIS — H35371 Puckering of macula, right eye: Secondary | ICD-10-CM | POA: Diagnosis not present

## 2016-08-01 DIAGNOSIS — H43822 Vitreomacular adhesion, left eye: Secondary | ICD-10-CM | POA: Diagnosis not present

## 2016-08-01 DIAGNOSIS — H43391 Other vitreous opacities, right eye: Secondary | ICD-10-CM | POA: Diagnosis not present

## 2016-08-01 DIAGNOSIS — H43811 Vitreous degeneration, right eye: Secondary | ICD-10-CM | POA: Diagnosis not present

## 2016-08-30 DIAGNOSIS — E291 Testicular hypofunction: Secondary | ICD-10-CM | POA: Diagnosis not present

## 2016-09-03 DIAGNOSIS — L989 Disorder of the skin and subcutaneous tissue, unspecified: Secondary | ICD-10-CM | POA: Diagnosis not present

## 2016-09-03 DIAGNOSIS — E119 Type 2 diabetes mellitus without complications: Secondary | ICD-10-CM | POA: Diagnosis not present

## 2016-09-03 DIAGNOSIS — Z1389 Encounter for screening for other disorder: Secondary | ICD-10-CM | POA: Diagnosis not present

## 2016-09-03 DIAGNOSIS — Z6833 Body mass index (BMI) 33.0-33.9, adult: Secondary | ICD-10-CM | POA: Diagnosis not present

## 2016-09-03 DIAGNOSIS — E039 Hypothyroidism, unspecified: Secondary | ICD-10-CM | POA: Diagnosis not present

## 2016-09-03 DIAGNOSIS — E785 Hyperlipidemia, unspecified: Secondary | ICD-10-CM | POA: Diagnosis not present

## 2016-09-03 DIAGNOSIS — I2510 Atherosclerotic heart disease of native coronary artery without angina pectoris: Secondary | ICD-10-CM | POA: Diagnosis not present

## 2016-09-03 DIAGNOSIS — E291 Testicular hypofunction: Secondary | ICD-10-CM | POA: Diagnosis not present

## 2016-09-06 DIAGNOSIS — L821 Other seborrheic keratosis: Secondary | ICD-10-CM | POA: Diagnosis not present

## 2016-09-06 DIAGNOSIS — C44722 Squamous cell carcinoma of skin of right lower limb, including hip: Secondary | ICD-10-CM | POA: Diagnosis not present

## 2016-10-01 DIAGNOSIS — E291 Testicular hypofunction: Secondary | ICD-10-CM | POA: Diagnosis not present

## 2016-10-05 MED FILL — LEVOTHYROXINE 175 MCG TAB: 175 | 90 days supply | Qty: 90 | Fill #0

## 2016-11-01 DIAGNOSIS — E291 Testicular hypofunction: Secondary | ICD-10-CM | POA: Diagnosis not present

## 2016-12-06 DIAGNOSIS — E785 Hyperlipidemia, unspecified: Secondary | ICD-10-CM | POA: Diagnosis not present

## 2016-12-06 DIAGNOSIS — E039 Hypothyroidism, unspecified: Secondary | ICD-10-CM | POA: Diagnosis not present

## 2016-12-06 DIAGNOSIS — Z136 Encounter for screening for cardiovascular disorders: Secondary | ICD-10-CM | POA: Diagnosis not present

## 2016-12-06 DIAGNOSIS — B354 Tinea corporis: Secondary | ICD-10-CM | POA: Diagnosis not present

## 2016-12-06 DIAGNOSIS — E669 Obesity, unspecified: Secondary | ICD-10-CM | POA: Diagnosis not present

## 2016-12-06 DIAGNOSIS — E291 Testicular hypofunction: Secondary | ICD-10-CM | POA: Diagnosis not present

## 2016-12-06 DIAGNOSIS — Z9181 History of falling: Secondary | ICD-10-CM | POA: Diagnosis not present

## 2016-12-06 DIAGNOSIS — Z1389 Encounter for screening for other disorder: Secondary | ICD-10-CM | POA: Diagnosis not present

## 2016-12-06 DIAGNOSIS — I2510 Atherosclerotic heart disease of native coronary artery without angina pectoris: Secondary | ICD-10-CM | POA: Diagnosis not present

## 2016-12-06 DIAGNOSIS — Z0000 Encounter for general adult medical examination without abnormal findings: Secondary | ICD-10-CM | POA: Diagnosis not present

## 2016-12-06 DIAGNOSIS — Z6833 Body mass index (BMI) 33.0-33.9, adult: Secondary | ICD-10-CM | POA: Diagnosis not present

## 2016-12-06 DIAGNOSIS — E119 Type 2 diabetes mellitus without complications: Secondary | ICD-10-CM | POA: Diagnosis not present

## 2016-12-06 MED FILL — KETOCONAZOLE 200 MG TABLET: 200 | 14 days supply | Qty: 14 | Fill #0

## 2016-12-31 DIAGNOSIS — E291 Testicular hypofunction: Secondary | ICD-10-CM | POA: Diagnosis not present

## 2016-12-31 MED FILL — KETOCONAZOLE 200 MG TABLET: 200 | 14 days supply | Qty: 14 | Fill #0

## 2017-01-03 MED FILL — LEVOTHYROXINE 175 MCG TAB: 175 | 90 days supply | Qty: 90 | Fill #1

## 2017-01-30 DIAGNOSIS — H43822 Vitreomacular adhesion, left eye: Secondary | ICD-10-CM | POA: Diagnosis not present

## 2017-01-30 DIAGNOSIS — H43391 Other vitreous opacities, right eye: Secondary | ICD-10-CM | POA: Diagnosis not present

## 2017-01-30 DIAGNOSIS — H35371 Puckering of macula, right eye: Secondary | ICD-10-CM | POA: Diagnosis not present

## 2017-01-30 DIAGNOSIS — H35341 Macular cyst, hole, or pseudohole, right eye: Secondary | ICD-10-CM | POA: Diagnosis not present

## 2017-01-30 DIAGNOSIS — E291 Testicular hypofunction: Secondary | ICD-10-CM | POA: Diagnosis not present

## 2017-03-08 DIAGNOSIS — E291 Testicular hypofunction: Secondary | ICD-10-CM | POA: Diagnosis not present

## 2017-03-08 DIAGNOSIS — Z6833 Body mass index (BMI) 33.0-33.9, adult: Secondary | ICD-10-CM | POA: Diagnosis not present

## 2017-03-08 DIAGNOSIS — E119 Type 2 diabetes mellitus without complications: Secondary | ICD-10-CM | POA: Diagnosis not present

## 2017-03-08 DIAGNOSIS — E785 Hyperlipidemia, unspecified: Secondary | ICD-10-CM | POA: Diagnosis not present

## 2017-03-08 DIAGNOSIS — R21 Rash and other nonspecific skin eruption: Secondary | ICD-10-CM | POA: Diagnosis not present

## 2017-03-08 DIAGNOSIS — I2510 Atherosclerotic heart disease of native coronary artery without angina pectoris: Secondary | ICD-10-CM | POA: Diagnosis not present

## 2017-03-08 DIAGNOSIS — E039 Hypothyroidism, unspecified: Secondary | ICD-10-CM | POA: Diagnosis not present

## 2017-03-08 DIAGNOSIS — Z125 Encounter for screening for malignant neoplasm of prostate: Secondary | ICD-10-CM | POA: Diagnosis not present

## 2017-03-08 MED FILL — TRIAMCINOLONE ACETONIDE 0.1: 0.1 | 10 days supply | Qty: 30 | Fill #0

## 2017-03-12 DIAGNOSIS — L219 Seborrheic dermatitis, unspecified: Secondary | ICD-10-CM | POA: Diagnosis not present

## 2017-04-08 DIAGNOSIS — E291 Testicular hypofunction: Secondary | ICD-10-CM | POA: Diagnosis not present

## 2017-04-23 DIAGNOSIS — L301 Dyshidrosis [pompholyx]: Secondary | ICD-10-CM | POA: Diagnosis not present

## 2017-04-23 DIAGNOSIS — L219 Seborrheic dermatitis, unspecified: Secondary | ICD-10-CM | POA: Diagnosis not present

## 2017-05-06 DIAGNOSIS — E291 Testicular hypofunction: Secondary | ICD-10-CM | POA: Diagnosis not present

## 2017-06-06 DIAGNOSIS — E291 Testicular hypofunction: Secondary | ICD-10-CM | POA: Diagnosis not present

## 2017-06-10 DIAGNOSIS — Z6831 Body mass index (BMI) 31.0-31.9, adult: Secondary | ICD-10-CM | POA: Diagnosis not present

## 2017-06-10 DIAGNOSIS — E039 Hypothyroidism, unspecified: Secondary | ICD-10-CM | POA: Diagnosis not present

## 2017-06-10 DIAGNOSIS — I2510 Atherosclerotic heart disease of native coronary artery without angina pectoris: Secondary | ICD-10-CM | POA: Diagnosis not present

## 2017-06-10 DIAGNOSIS — E119 Type 2 diabetes mellitus without complications: Secondary | ICD-10-CM | POA: Diagnosis not present

## 2017-06-10 DIAGNOSIS — E785 Hyperlipidemia, unspecified: Secondary | ICD-10-CM | POA: Diagnosis not present

## 2017-06-10 DIAGNOSIS — E291 Testicular hypofunction: Secondary | ICD-10-CM | POA: Diagnosis not present

## 2017-07-08 DIAGNOSIS — E291 Testicular hypofunction: Secondary | ICD-10-CM | POA: Diagnosis not present

## 2017-07-08 DIAGNOSIS — E039 Hypothyroidism, unspecified: Secondary | ICD-10-CM | POA: Diagnosis not present

## 2017-08-09 DIAGNOSIS — E291 Testicular hypofunction: Secondary | ICD-10-CM | POA: Diagnosis not present

## 2017-09-09 DIAGNOSIS — E291 Testicular hypofunction: Secondary | ICD-10-CM | POA: Diagnosis not present

## 2017-09-09 DIAGNOSIS — E119 Type 2 diabetes mellitus without complications: Secondary | ICD-10-CM | POA: Diagnosis not present

## 2017-09-09 DIAGNOSIS — E039 Hypothyroidism, unspecified: Secondary | ICD-10-CM | POA: Diagnosis not present

## 2017-09-09 DIAGNOSIS — Z1339 Encounter for screening examination for other mental health and behavioral disorders: Secondary | ICD-10-CM | POA: Diagnosis not present

## 2017-09-09 DIAGNOSIS — E785 Hyperlipidemia, unspecified: Secondary | ICD-10-CM | POA: Diagnosis not present

## 2017-09-09 DIAGNOSIS — Z1331 Encounter for screening for depression: Secondary | ICD-10-CM | POA: Diagnosis not present

## 2017-10-11 DIAGNOSIS — E291 Testicular hypofunction: Secondary | ICD-10-CM | POA: Diagnosis not present

## 2017-10-15 DIAGNOSIS — H524 Presbyopia: Secondary | ICD-10-CM | POA: Diagnosis not present

## 2017-10-15 DIAGNOSIS — H43821 Vitreomacular adhesion, right eye: Secondary | ICD-10-CM | POA: Diagnosis not present

## 2017-11-11 DIAGNOSIS — E291 Testicular hypofunction: Secondary | ICD-10-CM | POA: Diagnosis not present

## 2017-12-12 DIAGNOSIS — E785 Hyperlipidemia, unspecified: Secondary | ICD-10-CM | POA: Diagnosis not present

## 2017-12-12 DIAGNOSIS — E291 Testicular hypofunction: Secondary | ICD-10-CM | POA: Diagnosis not present

## 2017-12-12 DIAGNOSIS — E039 Hypothyroidism, unspecified: Secondary | ICD-10-CM | POA: Diagnosis not present

## 2017-12-12 DIAGNOSIS — E119 Type 2 diabetes mellitus without complications: Secondary | ICD-10-CM | POA: Diagnosis not present

## 2017-12-12 DIAGNOSIS — Z9181 History of falling: Secondary | ICD-10-CM | POA: Diagnosis not present

## 2018-01-14 DIAGNOSIS — E669 Obesity, unspecified: Secondary | ICD-10-CM | POA: Diagnosis not present

## 2018-01-14 DIAGNOSIS — E785 Hyperlipidemia, unspecified: Secondary | ICD-10-CM | POA: Diagnosis not present

## 2018-01-14 DIAGNOSIS — Z0000 Encounter for general adult medical examination without abnormal findings: Secondary | ICD-10-CM | POA: Diagnosis not present

## 2018-01-14 DIAGNOSIS — Z125 Encounter for screening for malignant neoplasm of prostate: Secondary | ICD-10-CM | POA: Diagnosis not present

## 2018-01-14 DIAGNOSIS — Z136 Encounter for screening for cardiovascular disorders: Secondary | ICD-10-CM | POA: Diagnosis not present

## 2018-01-14 DIAGNOSIS — Z6831 Body mass index (BMI) 31.0-31.9, adult: Secondary | ICD-10-CM | POA: Diagnosis not present

## 2018-01-14 DIAGNOSIS — E291 Testicular hypofunction: Secondary | ICD-10-CM | POA: Diagnosis not present

## 2018-02-14 DIAGNOSIS — E291 Testicular hypofunction: Secondary | ICD-10-CM | POA: Diagnosis not present

## 2018-03-18 DIAGNOSIS — Z125 Encounter for screening for malignant neoplasm of prostate: Secondary | ICD-10-CM | POA: Diagnosis not present

## 2018-03-18 DIAGNOSIS — E785 Hyperlipidemia, unspecified: Secondary | ICD-10-CM | POA: Diagnosis not present

## 2018-03-18 DIAGNOSIS — E039 Hypothyroidism, unspecified: Secondary | ICD-10-CM | POA: Diagnosis not present

## 2018-03-18 DIAGNOSIS — E119 Type 2 diabetes mellitus without complications: Secondary | ICD-10-CM | POA: Diagnosis not present

## 2018-03-18 DIAGNOSIS — E291 Testicular hypofunction: Secondary | ICD-10-CM | POA: Diagnosis not present

## 2018-04-15 DIAGNOSIS — E291 Testicular hypofunction: Secondary | ICD-10-CM | POA: Diagnosis not present

## 2018-04-21 DIAGNOSIS — E039 Hypothyroidism, unspecified: Secondary | ICD-10-CM | POA: Diagnosis not present

## 2018-04-21 DIAGNOSIS — E291 Testicular hypofunction: Secondary | ICD-10-CM | POA: Diagnosis not present

## 2018-05-14 DIAGNOSIS — E291 Testicular hypofunction: Secondary | ICD-10-CM | POA: Diagnosis not present

## 2018-06-16 DIAGNOSIS — E291 Testicular hypofunction: Secondary | ICD-10-CM | POA: Diagnosis not present

## 2018-06-18 DIAGNOSIS — E1169 Type 2 diabetes mellitus with other specified complication: Secondary | ICD-10-CM | POA: Diagnosis not present

## 2018-06-18 DIAGNOSIS — E039 Hypothyroidism, unspecified: Secondary | ICD-10-CM | POA: Diagnosis not present

## 2018-06-18 DIAGNOSIS — E291 Testicular hypofunction: Secondary | ICD-10-CM | POA: Diagnosis not present

## 2018-06-18 DIAGNOSIS — E785 Hyperlipidemia, unspecified: Secondary | ICD-10-CM | POA: Diagnosis not present

## 2018-07-16 DIAGNOSIS — E291 Testicular hypofunction: Secondary | ICD-10-CM | POA: Diagnosis not present

## 2018-08-15 DIAGNOSIS — E291 Testicular hypofunction: Secondary | ICD-10-CM | POA: Diagnosis not present

## 2018-09-15 DIAGNOSIS — E291 Testicular hypofunction: Secondary | ICD-10-CM | POA: Diagnosis not present

## 2018-09-15 DIAGNOSIS — E1169 Type 2 diabetes mellitus with other specified complication: Secondary | ICD-10-CM | POA: Diagnosis not present

## 2018-09-15 DIAGNOSIS — E785 Hyperlipidemia, unspecified: Secondary | ICD-10-CM | POA: Diagnosis not present

## 2018-09-15 DIAGNOSIS — Z139 Encounter for screening, unspecified: Secondary | ICD-10-CM | POA: Diagnosis not present

## 2018-09-15 DIAGNOSIS — E039 Hypothyroidism, unspecified: Secondary | ICD-10-CM | POA: Diagnosis not present

## 2018-09-15 DIAGNOSIS — Z1331 Encounter for screening for depression: Secondary | ICD-10-CM | POA: Diagnosis not present

## 2018-09-18 ENCOUNTER — Other Ambulatory Visit: Payer: Self-pay | Admitting: Sports Medicine

## 2018-09-18 ENCOUNTER — Other Ambulatory Visit: Payer: Self-pay

## 2018-09-18 ENCOUNTER — Ambulatory Visit (INDEPENDENT_AMBULATORY_CARE_PROVIDER_SITE_OTHER): Payer: Medicare Other | Admitting: Sports Medicine

## 2018-09-18 ENCOUNTER — Encounter: Payer: Self-pay | Admitting: Sports Medicine

## 2018-09-18 VITALS — Temp 96.8°F | Resp 16

## 2018-09-18 DIAGNOSIS — M79675 Pain in left toe(s): Secondary | ICD-10-CM

## 2018-09-18 DIAGNOSIS — E119 Type 2 diabetes mellitus without complications: Secondary | ICD-10-CM

## 2018-09-18 DIAGNOSIS — S91202A Unspecified open wound of left great toe with damage to nail, initial encounter: Secondary | ICD-10-CM

## 2018-09-18 DIAGNOSIS — M79671 Pain in right foot: Secondary | ICD-10-CM

## 2018-09-18 DIAGNOSIS — S91209A Unspecified open wound of unspecified toe(s) with damage to nail, initial encounter: Secondary | ICD-10-CM

## 2018-09-18 MED ORDER — NEOMYCIN-POLYMYXIN-HC 1 % OT SOLN: 2.0000 [drp] | Freq: Two times a day (BID) | OTIC | 0 refills | Status: DC

## 2018-09-18 NOTE — Patient Instructions (Signed)
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
## 2018-10-02 ENCOUNTER — Encounter: Payer: Self-pay | Admitting: Sports Medicine

## 2018-10-02 ENCOUNTER — Ambulatory Visit (INDEPENDENT_AMBULATORY_CARE_PROVIDER_SITE_OTHER): Payer: Self-pay | Admitting: Sports Medicine

## 2018-10-02 ENCOUNTER — Other Ambulatory Visit: Payer: Self-pay

## 2018-10-02 VITALS — Temp 97.1°F | Resp 16

## 2018-10-02 DIAGNOSIS — M79675 Pain in left toe(s): Secondary | ICD-10-CM

## 2018-10-02 DIAGNOSIS — Z9889 Other specified postprocedural states: Secondary | ICD-10-CM

## 2018-10-02 DIAGNOSIS — E119 Type 2 diabetes mellitus without complications: Secondary | ICD-10-CM

## 2018-10-02 NOTE — Progress Notes (Signed)
Subjective: [name] is a 68 y.o. male patient returns to office today for follow up evaluation after having Left total permanent nail avulsion. Patient has been soaking using Epsom salt and applying topical antibiotic drops covered with bandaid daily but stopped on last week. Patient denies fever/chills/nausea/vomitting/any other related constitutional symptoms at this time.  Patient Active Problem List   Diagnosis Date Noted  . Acute cholecystitis 03/17/2013  . DM (diabetes mellitus) ([place]) 08/28/2011  . Hypogonadism, male 08/28/2011  . CAD (coronary artery disease)   . Ejection fraction   . HYPERURICEMIA 08/07/2009  . DEGENERATIVE JOINT DISEASE 08/03/2008  . OVERWEIGHT/OBESITY 05/24/2008  . ALLERGIC RHINITIS 06/26/2007  . Obstructive sleep apnea 04/15/2007  . HYPOTHYROIDISM 03/28/2007  . HYPERLIPIDEMIA 03/28/2007  . HYPERTENSION 03/28/2007  . ASTHMATIC BRONCHITIS, ACUTE 03/28/2007    Current Outpatient Medications on File Prior to Visit  Medication Sig Dispense Refill  . aspirin 325 MG tablet Take 325 mg by mouth daily.     . clindamycin (CLEOCIN) 300 MG capsule TAKE 1 CAPSULE BY MOUTH THREE TIMES DAILY WITH FOOD FOR FOOT INFECTION    . fluocinonide-emollient (LIDEX-E) 0.05 % cream Apply 1 application topically as needed.     . fluticasone (FLONASE) 50 MCG/ACT nasal spray Place 1 spray into both nostrils daily. 16 g 3  . levothyroxine (SYNTHROID, LEVOTHROID) 150 MCG tablet TAKE 1 TABLET BY MOUTH ONCE DAILY 90 tablet 3  . Multiple Vitamin (MULTIVITAMIN) capsule Take 1 capsule by mouth daily.     . NEOMYCIN-POLYMYXIN-HYDROCORTISONE (CORTISPORIN) 1 % SOLN OTIC solution Place 2 drops into both ears 2 (two) times daily. 10 mL 0   No current facility-administered medications on file prior to visit.     Allergies  Allergen Reactions  . Ibuprofen     REACTION: intol to large amounts of ibuprofen    Objective:  General: Well developed, nourished, in no acute distress, alert  and oriented x3   Dermatology: Skin is warm, dry and supple bilateral.  Left hallux  nail bed appears to be clean, dry, with mild granular tissue and surrounding eschar/scab.  Faint erythema. (-) Edema. (-) serosanguous drainage present. The remaining nails appear unremarkable at this time. There are no other lesions or other signs of infection  present.  Neurovascular status: Intact. No lower extremity swelling; No pain with calf compression bilateral.  Musculoskeletal: Decreased tenderness to palpation of the left hallux nail bed.  Muscular strength within normal limits bilateral.   Assesement and Plan: Problem List Items Addressed This Visit    None    Visit Diagnoses    S/P nail surgery    -  Primary   Toe pain, left       Diabetes mellitus without complication ([place])          -Examined patient  -Cleansed left hallux nail bed and gently scrubbed with peroxide and q-tip/curetted away eschar at site and applied antibiotic cream covered with bandaid.  -Discussed plan of care with patient. -Patient to resume soaking in a weak solution of Epsom salt and warm water. Patient was instructed to soak for 15-20 minutes each day until the toe appears normal and there is no drainage, redness, tenderness, or swelling at the procedure site, and apply Corticosporin solution and a gauze or bandaid dressing each day as needed. May leave open to air at night. -Educated patient on long term care after nail surgery. -Patient was instructed to monitor the toe for reoccurrence and signs of infection; Patient advised to return  to office or go to ER if toe becomes red, hot or swollen. -Patient is to return as needed or sooner if problems arise.  Landis Martins, DPM

## 2018-10-16 DIAGNOSIS — E291 Testicular hypofunction: Secondary | ICD-10-CM | POA: Diagnosis not present

## 2018-11-17 DIAGNOSIS — E291 Testicular hypofunction: Secondary | ICD-10-CM | POA: Diagnosis not present

## 2018-12-16 DIAGNOSIS — Z9181 History of falling: Secondary | ICD-10-CM | POA: Diagnosis not present

## 2018-12-16 DIAGNOSIS — E039 Hypothyroidism, unspecified: Secondary | ICD-10-CM | POA: Diagnosis not present

## 2018-12-16 DIAGNOSIS — E785 Hyperlipidemia, unspecified: Secondary | ICD-10-CM | POA: Diagnosis not present

## 2018-12-16 DIAGNOSIS — E291 Testicular hypofunction: Secondary | ICD-10-CM | POA: Diagnosis not present

## 2018-12-16 DIAGNOSIS — E1169 Type 2 diabetes mellitus with other specified complication: Secondary | ICD-10-CM | POA: Diagnosis not present

## 2018-12-16 DIAGNOSIS — Z125 Encounter for screening for malignant neoplasm of prostate: Secondary | ICD-10-CM | POA: Diagnosis not present

## 2019-01-16 DIAGNOSIS — E291 Testicular hypofunction: Secondary | ICD-10-CM | POA: Diagnosis not present

## 2019-01-19 DIAGNOSIS — Z1331 Encounter for screening for depression: Secondary | ICD-10-CM | POA: Diagnosis not present

## 2019-01-19 DIAGNOSIS — Z125 Encounter for screening for malignant neoplasm of prostate: Secondary | ICD-10-CM | POA: Diagnosis not present

## 2019-01-19 DIAGNOSIS — Z9181 History of falling: Secondary | ICD-10-CM | POA: Diagnosis not present

## 2019-01-19 DIAGNOSIS — Z0000 Encounter for general adult medical examination without abnormal findings: Secondary | ICD-10-CM | POA: Diagnosis not present

## 2019-01-19 DIAGNOSIS — Z6833 Body mass index (BMI) 33.0-33.9, adult: Secondary | ICD-10-CM | POA: Diagnosis not present

## 2019-01-19 DIAGNOSIS — E785 Hyperlipidemia, unspecified: Secondary | ICD-10-CM | POA: Diagnosis not present

## 2019-01-19 DIAGNOSIS — Z1211 Encounter for screening for malignant neoplasm of colon: Secondary | ICD-10-CM | POA: Diagnosis not present

## 2019-03-13 DIAGNOSIS — E291 Testicular hypofunction: Secondary | ICD-10-CM | POA: Diagnosis not present

## 2019-04-13 DIAGNOSIS — E291 Testicular hypofunction: Secondary | ICD-10-CM | POA: Diagnosis not present

## 2019-05-13 DIAGNOSIS — E291 Testicular hypofunction: Secondary | ICD-10-CM | POA: Diagnosis not present

## 2019-06-16 DIAGNOSIS — Z125 Encounter for screening for malignant neoplasm of prostate: Secondary | ICD-10-CM | POA: Diagnosis not present

## 2019-06-16 DIAGNOSIS — E1169 Type 2 diabetes mellitus with other specified complication: Secondary | ICD-10-CM | POA: Diagnosis not present

## 2019-06-16 DIAGNOSIS — E039 Hypothyroidism, unspecified: Secondary | ICD-10-CM | POA: Diagnosis not present

## 2019-06-16 DIAGNOSIS — E291 Testicular hypofunction: Secondary | ICD-10-CM | POA: Diagnosis not present

## 2019-06-16 DIAGNOSIS — E785 Hyperlipidemia, unspecified: Secondary | ICD-10-CM | POA: Diagnosis not present

## 2019-07-17 DIAGNOSIS — E291 Testicular hypofunction: Secondary | ICD-10-CM | POA: Diagnosis not present

## 2019-08-27 DIAGNOSIS — E7800 Pure hypercholesterolemia, unspecified: Secondary | ICD-10-CM | POA: Diagnosis not present

## 2019-08-27 DIAGNOSIS — Z955 Presence of coronary angioplasty implant and graft: Secondary | ICD-10-CM | POA: Diagnosis not present

## 2019-08-27 DIAGNOSIS — Z79899 Other long term (current) drug therapy: Secondary | ICD-10-CM | POA: Diagnosis not present

## 2019-08-27 DIAGNOSIS — I639 Cerebral infarction, unspecified: Secondary | ICD-10-CM | POA: Diagnosis not present

## 2019-08-27 DIAGNOSIS — I2510 Atherosclerotic heart disease of native coronary artery without angina pectoris: Secondary | ICD-10-CM | POA: Diagnosis not present

## 2019-08-27 DIAGNOSIS — E785 Hyperlipidemia, unspecified: Secondary | ICD-10-CM | POA: Diagnosis not present

## 2019-08-27 DIAGNOSIS — E039 Hypothyroidism, unspecified: Secondary | ICD-10-CM | POA: Diagnosis not present

## 2019-08-27 DIAGNOSIS — I252 Old myocardial infarction: Secondary | ICD-10-CM | POA: Diagnosis not present

## 2019-09-11 DIAGNOSIS — Z79899 Other long term (current) drug therapy: Secondary | ICD-10-CM | POA: Diagnosis not present

## 2019-09-11 DIAGNOSIS — H35341 Macular cyst, hole, or pseudohole, right eye: Secondary | ICD-10-CM | POA: Diagnosis not present

## 2019-09-11 DIAGNOSIS — I2510 Atherosclerotic heart disease of native coronary artery without angina pectoris: Secondary | ICD-10-CM | POA: Diagnosis not present

## 2019-09-11 DIAGNOSIS — Z7902 Long term (current) use of antithrombotics/antiplatelets: Secondary | ICD-10-CM | POA: Diagnosis not present

## 2019-09-11 DIAGNOSIS — R2689 Other abnormalities of gait and mobility: Secondary | ICD-10-CM | POA: Diagnosis not present

## 2019-09-11 DIAGNOSIS — E785 Hyperlipidemia, unspecified: Secondary | ICD-10-CM | POA: Diagnosis not present

## 2019-09-11 DIAGNOSIS — E039 Hypothyroidism, unspecified: Secondary | ICD-10-CM | POA: Diagnosis not present

## 2019-09-11 DIAGNOSIS — R29702 NIHSS score 2: Secondary | ICD-10-CM | POA: Diagnosis not present

## 2019-09-11 DIAGNOSIS — Z955 Presence of coronary angioplasty implant and graft: Secondary | ICD-10-CM | POA: Diagnosis not present

## 2019-09-11 DIAGNOSIS — R2681 Unsteadiness on feet: Secondary | ICD-10-CM | POA: Diagnosis not present

## 2019-09-11 DIAGNOSIS — I252 Old myocardial infarction: Secondary | ICD-10-CM | POA: Diagnosis not present

## 2019-09-11 DIAGNOSIS — I639 Cerebral infarction, unspecified: Secondary | ICD-10-CM | POA: Diagnosis not present

## 2019-09-11 DIAGNOSIS — Z7982 Long term (current) use of aspirin: Secondary | ICD-10-CM | POA: Diagnosis not present

## 2019-09-16 DIAGNOSIS — E291 Testicular hypofunction: Secondary | ICD-10-CM | POA: Diagnosis not present

## 2019-09-16 DIAGNOSIS — E039 Hypothyroidism, unspecified: Secondary | ICD-10-CM | POA: Diagnosis not present

## 2019-09-16 DIAGNOSIS — I6990 Unspecified sequelae of unspecified cerebrovascular disease: Secondary | ICD-10-CM | POA: Diagnosis not present

## 2019-09-16 DIAGNOSIS — I10 Essential (primary) hypertension: Secondary | ICD-10-CM | POA: Diagnosis not present

## 2019-09-16 DIAGNOSIS — I2510 Atherosclerotic heart disease of native coronary artery without angina pectoris: Secondary | ICD-10-CM | POA: Diagnosis not present

## 2019-09-27 ENCOUNTER — Emergency Department (HOSPITAL_COMMUNITY): Payer: Medicare Other

## 2019-09-27 ENCOUNTER — Encounter (HOSPITAL_COMMUNITY): Payer: Self-pay | Admitting: Emergency Medicine

## 2019-09-27 ENCOUNTER — Inpatient Hospital Stay (HOSPITAL_COMMUNITY)
Admission: EM | Admit: 2019-09-27 | Discharge: 2019-10-05 | DRG: 234 | Disposition: A | Discharge status: Home / Self Care | Payer: Medicare Other | Attending: Cardiothoracic Surgery | Admitting: Cardiothoracic Surgery

## 2019-09-27 ENCOUNTER — Other Ambulatory Visit: Payer: Self-pay

## 2019-09-27 DIAGNOSIS — I252 Old myocardial infarction: Secondary | ICD-10-CM

## 2019-09-27 DIAGNOSIS — R079 Chest pain, unspecified: Secondary | ICD-10-CM | POA: Diagnosis not present

## 2019-09-27 DIAGNOSIS — I440 Atrioventricular block, first degree: Secondary | ICD-10-CM | POA: Diagnosis present

## 2019-09-27 DIAGNOSIS — Z8249 Family history of ischemic heart disease and other diseases of the circulatory system: Secondary | ICD-10-CM

## 2019-09-27 DIAGNOSIS — D62 Acute posthemorrhagic anemia: Secondary | ICD-10-CM | POA: Diagnosis not present

## 2019-09-27 DIAGNOSIS — Z6829 Body mass index (BMI) 29.0-29.9, adult: Secondary | ICD-10-CM

## 2019-09-27 DIAGNOSIS — M549 Dorsalgia, unspecified: Secondary | ICD-10-CM | POA: Diagnosis not present

## 2019-09-27 DIAGNOSIS — Z20822 Contact with and (suspected) exposure to covid-19: Secondary | ICD-10-CM | POA: Diagnosis present

## 2019-09-27 DIAGNOSIS — J984 Other disorders of lung: Secondary | ICD-10-CM | POA: Diagnosis not present

## 2019-09-27 DIAGNOSIS — T82855A Stenosis of coronary artery stent, initial encounter: Secondary | ICD-10-CM | POA: Diagnosis present

## 2019-09-27 DIAGNOSIS — Y713 Surgical instruments, materials and cardiovascular devices (including sutures) associated with adverse incidents: Secondary | ICD-10-CM | POA: Diagnosis present

## 2019-09-27 DIAGNOSIS — J449 Chronic obstructive pulmonary disease, unspecified: Secondary | ICD-10-CM | POA: Diagnosis not present

## 2019-09-27 DIAGNOSIS — E785 Hyperlipidemia, unspecified: Secondary | ICD-10-CM

## 2019-09-27 DIAGNOSIS — I200 Unstable angina: Secondary | ICD-10-CM | POA: Diagnosis present

## 2019-09-27 DIAGNOSIS — Z79899 Other long term (current) drug therapy: Secondary | ICD-10-CM

## 2019-09-27 DIAGNOSIS — Z7902 Long term (current) use of antithrombotics/antiplatelets: Secondary | ICD-10-CM

## 2019-09-27 DIAGNOSIS — I2584 Coronary atherosclerosis due to calcified coronary lesion: Secondary | ICD-10-CM | POA: Diagnosis present

## 2019-09-27 DIAGNOSIS — Z87891 Personal history of nicotine dependence: Secondary | ICD-10-CM

## 2019-09-27 DIAGNOSIS — I2510 Atherosclerotic heart disease of native coronary artery without angina pectoris: Secondary | ICD-10-CM

## 2019-09-27 DIAGNOSIS — R0902 Hypoxemia: Secondary | ICD-10-CM | POA: Diagnosis not present

## 2019-09-27 DIAGNOSIS — I2541 Coronary artery aneurysm: Secondary | ICD-10-CM | POA: Diagnosis present

## 2019-09-27 DIAGNOSIS — Z886 Allergy status to analgesic agent status: Secondary | ICD-10-CM

## 2019-09-27 DIAGNOSIS — Z7982 Long term (current) use of aspirin: Secondary | ICD-10-CM

## 2019-09-27 DIAGNOSIS — R072 Precordial pain: Secondary | ICD-10-CM | POA: Diagnosis not present

## 2019-09-27 DIAGNOSIS — Z955 Presence of coronary angioplasty implant and graft: Secondary | ICD-10-CM

## 2019-09-27 DIAGNOSIS — M1990 Unspecified osteoarthritis, unspecified site: Secondary | ICD-10-CM | POA: Diagnosis present

## 2019-09-27 DIAGNOSIS — J90 Pleural effusion, not elsewhere classified: Secondary | ICD-10-CM

## 2019-09-27 DIAGNOSIS — I25110 Atherosclerotic heart disease of native coronary artery with unstable angina pectoris: Secondary | ICD-10-CM | POA: Diagnosis not present

## 2019-09-27 DIAGNOSIS — R0602 Shortness of breath: Secondary | ICD-10-CM | POA: Diagnosis not present

## 2019-09-27 DIAGNOSIS — I10 Essential (primary) hypertension: Secondary | ICD-10-CM | POA: Diagnosis not present

## 2019-09-27 DIAGNOSIS — E8770 Fluid overload, unspecified: Secondary | ICD-10-CM | POA: Diagnosis not present

## 2019-09-27 DIAGNOSIS — G4733 Obstructive sleep apnea (adult) (pediatric): Secondary | ICD-10-CM | POA: Diagnosis present

## 2019-09-27 DIAGNOSIS — Z7984 Long term (current) use of oral hypoglycemic drugs: Secondary | ICD-10-CM

## 2019-09-27 DIAGNOSIS — Z951 Presence of aortocoronary bypass graft: Secondary | ICD-10-CM

## 2019-09-27 DIAGNOSIS — R531 Weakness: Secondary | ICD-10-CM | POA: Diagnosis not present

## 2019-09-27 DIAGNOSIS — E663 Overweight: Secondary | ICD-10-CM | POA: Diagnosis present

## 2019-09-27 DIAGNOSIS — R0789 Other chest pain: Secondary | ICD-10-CM | POA: Diagnosis not present

## 2019-09-27 DIAGNOSIS — E119 Type 2 diabetes mellitus without complications: Secondary | ICD-10-CM | POA: Diagnosis present

## 2019-09-27 DIAGNOSIS — Z9119 Patient's noncompliance with other medical treatment and regimen: Secondary | ICD-10-CM

## 2019-09-27 DIAGNOSIS — Z79890 Hormone replacement therapy (postmenopausal): Secondary | ICD-10-CM

## 2019-09-27 DIAGNOSIS — Z09 Encounter for follow-up examination after completed treatment for conditions other than malignant neoplasm: Secondary | ICD-10-CM

## 2019-09-27 DIAGNOSIS — Z8673 Personal history of transient ischemic attack (TIA), and cerebral infarction without residual deficits: Secondary | ICD-10-CM

## 2019-09-27 DIAGNOSIS — K219 Gastro-esophageal reflux disease without esophagitis: Secondary | ICD-10-CM | POA: Diagnosis present

## 2019-09-27 DIAGNOSIS — E039 Hypothyroidism, unspecified: Secondary | ICD-10-CM | POA: Diagnosis present

## 2019-09-27 LAB — CREATININE, SERUM
Creatinine, Ser: 1.06 mg/dL (ref 0.61–1.24)
GFR calc Af Amer: >60 mL/min (ref >60)
GFR calc non Af Amer: >60 mL/min (ref >60)

## 2019-09-27 LAB — CBC
HCT: 45.1 % (ref 39.0–52.0)
HCT: 46.9 % (ref 39.0–52.0)
Hemoglobin: 14.6 g/dL (ref 13.0–17.0)
Hemoglobin: 15.1 g/dL (ref 13.0–17.0)
MCH: 29.9 pg (ref 26.0–34.0)
MCH: 29.8 pg (ref 26.0–34.0)
MCHC: 32.4 g/dL (ref 30.0–36.0)
MCHC: 32.2 g/dL (ref 30.0–36.0)
MCV: 92.4 fL (ref 80.0–100.0)
MCV: 92.5 fL (ref 80.0–100.0)
Platelets: 270 10*3/uL (ref 150–400)
Platelets: 262 10*3/uL (ref 150–400)
RBC: 4.88 MIL/uL (ref 4.22–5.81)
RBC: 5.07 MIL/uL (ref 4.22–5.81)
RDW: 13.8 % (ref 11.5–15.5)
RDW: 13.9 % (ref 11.5–15.5)
WBC: 9.4 10*3/uL (ref 4.0–10.5)
WBC: 8.9 10*3/uL (ref 4.0–10.5)
nRBC: 0 % (ref 0.0–0.2)
nRBC: 0 % (ref 0.0–0.2)

## 2019-09-27 LAB — PROTIME-INR
INR: 0.9 (ref 0.8–1.2)
Prothrombin Time: 12.1 seconds (ref 11.4–15.2)

## 2019-09-27 LAB — TSH: TSH: 1.422 u[IU]/mL (ref 0.350–4.500)

## 2019-09-27 LAB — BASIC METABOLIC PANEL
Anion gap: 12 (ref 5–15)
BUN: 14 mg/dL (ref 8–23)
CO2: 26 mmol/L (ref 22–32)
Calcium: 9.6 mg/dL (ref 8.9–10.3)
Chloride: 101 mmol/L (ref 98–111)
Creatinine, Ser: 1.15 mg/dL (ref 0.61–1.24)
GFR calc Af Amer: >60 mL/min (ref >60)
GFR calc non Af Amer: >60 mL/min (ref >60)
Glucose, Bld: 107 mg/dL — ABNORMAL HIGH (ref 70–99)
Potassium: 3.8 mmol/L (ref 3.5–5.1)
Sodium: 139 mmol/L (ref 135–145)

## 2019-09-27 LAB — HEMOGLOBIN A1C
Hgb A1c MFr Bld: 5.9 % — ABNORMAL HIGH (ref 4.8–5.6)
Mean Plasma Glucose: 122.63 mg/dL

## 2019-09-27 LAB — TROPONIN I (HIGH SENSITIVITY)
Troponin I (High Sensitivity): 6 ng/L (ref <18)
Troponin I (High Sensitivity): 8 ng/L (ref <18)

## 2019-09-27 LAB — SARS CORONAVIRUS 2 BY RT PCR (HOSPITAL ORDER, PERFORMED IN ~~LOC~~ HOSPITAL LAB): SARS Coronavirus 2: NEGATIVE

## 2019-09-27 LAB — MAGNESIUM: Magnesium: 2.3 mg/dL (ref 1.7–2.4)

## 2019-09-27 LAB — HIV ANTIBODY (ROUTINE TESTING W REFLEX): HIV Screen 4th Generation wRfx: NONREACTIVE

## 2019-09-27 MED ORDER — ASPIRIN EC 81 MG PO TBEC
81.0000 mg | DELAYED_RELEASE_TABLET | Freq: Every day | ORAL | Status: DC
  Administered 2019-09-29: 81 mg via ORAL
  Filled 2019-09-27 (×2): qty 1

## 2019-09-27 MED ORDER — ACETAMINOPHEN 325 MG PO TABS: 650.0000 mg | ORAL_TABLET | ORAL | Status: DC | PRN

## 2019-09-27 MED ORDER — SODIUM CHLORIDE 0.9% FLUSH: 3.0000 mL | Freq: Once | INTRAVENOUS | Status: DC

## 2019-09-27 MED ORDER — ONDANSETRON HCL 4 MG/2ML IJ SOLN: 4.0000 mg | Freq: Four times a day (QID) | INTRAMUSCULAR | Status: DC | PRN

## 2019-09-27 MED ORDER — HEPARIN SODIUM (PORCINE) 5000 UNIT/ML IJ SOLN
5000.0000 [IU] | Freq: Three times a day (TID) | INTRAMUSCULAR | Status: DC
  Administered 2019-09-27 – 2019-09-28 (×2): 5000 [IU] via SUBCUTANEOUS
  Filled 2019-09-27 (×2): qty 1

## 2019-09-27 MED ORDER — METOPROLOL TARTRATE 12.5 MG HALF TABLET
12.5000 mg | ORAL_TABLET | Freq: Two times a day (BID) | ORAL | Status: DC
  Administered 2019-09-27 – 2019-09-29 (×5): 12.5 mg via ORAL
  Filled 2019-09-27 (×5): qty 1

## 2019-09-27 MED ORDER — ASPIRIN 81 MG PO CHEW
81.0000 mg | CHEWABLE_TABLET | ORAL | Status: AC
  Administered 2019-09-28: 81 mg via ORAL
  Filled 2019-09-27: qty 1

## 2019-09-27 MED ORDER — SODIUM CHLORIDE 0.9 % IV SOLN: 250.0000 mL | INTRAVENOUS | Status: DC | PRN

## 2019-09-27 MED ORDER — SODIUM CHLORIDE 0.9 % WEIGHT BASED INFUSION
1.0000 mL/kg/h | INTRAVENOUS | Status: DC
  Administered 2019-09-28: 1 mL/kg/h via INTRAVENOUS

## 2019-09-27 MED ORDER — ASPIRIN 300 MG RE SUPP: 300.0000 mg | RECTAL | Status: AC

## 2019-09-27 MED ORDER — SODIUM CHLORIDE 0.9% FLUSH: 3.0000 mL | INTRAVENOUS | Status: DC | PRN

## 2019-09-27 MED ORDER — SODIUM CHLORIDE 0.9 % WEIGHT BASED INFUSION
3.0000 mL/kg/h | INTRAVENOUS | Status: DC
  Administered 2019-09-28: 3 mL/kg/h via INTRAVENOUS

## 2019-09-27 MED ORDER — ALPRAZOLAM 0.25 MG PO TABS: 0.2500 mg | ORAL_TABLET | Freq: Two times a day (BID) | ORAL | Status: DC | PRN

## 2019-09-27 MED ORDER — LEVOTHYROXINE SODIUM 75 MCG PO TABS
150.0000 ug | ORAL_TABLET | Freq: Every day | ORAL | Status: DC
  Administered 2019-09-28 – 2019-10-02 (×4): 150 ug via ORAL
  Filled 2019-09-27 (×5): qty 2

## 2019-09-27 MED ORDER — NITROGLYCERIN 0.4 MG SL SUBL: 0.4000 mg | SUBLINGUAL_TABLET | SUBLINGUAL | Status: DC | PRN

## 2019-09-27 MED ORDER — ZOLPIDEM TARTRATE 5 MG PO TABS: 5.0000 mg | ORAL_TABLET | Freq: Every evening | ORAL | Status: DC | PRN

## 2019-09-27 MED ORDER — ASPIRIN 81 MG PO CHEW: 324.0000 mg | CHEWABLE_TABLET | ORAL | Status: AC

## 2019-09-27 MED ORDER — SODIUM CHLORIDE 0.9% FLUSH
3.0000 mL | Freq: Two times a day (BID) | INTRAVENOUS | Status: DC
  Administered 2019-09-27 – 2019-09-29 (×2): 3 mL via INTRAVENOUS

## 2019-09-27 MED ORDER — SODIUM CHLORIDE 0.9% FLUSH
3.0000 mL | Freq: Two times a day (BID) | INTRAVENOUS | Status: DC
  Administered 2019-09-27 – 2019-09-29 (×4): 3 mL via INTRAVENOUS

## 2019-09-27 NOTE — Plan of Care (Signed)

## 2019-09-27 NOTE — ED Provider Notes (Signed)
[facility] EMERGENCY DEPARTMENT Provider Note   CSN: [id number] Arrival date & time: 09/27/19  7672     History Chief Complaint  Patient presents with  . Chest Pain    [name] is a 69 y.o. male.  The history is provided by the patient and medical records. No language interpreter was used.  Chest Pain Pain location:  Substernal area Pain quality: crushing, pressure and radiating   Pain radiates to:  Neck Pain severity:  Severe Onset quality:  Gradual Duration:  3 days Timing:  Intermittent Progression:  Waxing and waning Chronicity:  Recurrent Relieved by:  Nothing Worsened by:  Nothing Ineffective treatments:  None tried Associated symptoms: diaphoresis, fatigue, nausea and shortness of breath   Associated symptoms: no altered mental status, no anxiety, no back pain, no cough, no fever, no headache, no lower extremity edema, no near-syncope, no palpitations, no vomiting and no weakness   Risk factors: coronary artery disease        Past Medical History:  Diagnosis Date  . Allergic rhinitis, cause unspecified   . Arthritis    "probably some form in my joints" (03/17/2013)  . Asthma    "a little" (03/17/2013)  . CAD (coronary artery disease)    DES 07/2004 /   DES LAD 09/2004  /  cath 08/2005 stents patent  /  nuclear 02/2008 no ischemia  . Chronic bronchitis ([place])    "used to get it q yr; haven't had it for the last couple years now" (03/17/2013)  . COPD (chronic obstructive pulmonary disease) ([place])    "a little" (03/17/2013)  . Dyslipidemia   . Ejection fraction    EF 55% cath, 08/2005  /  EF 60% nuclear, 02/2008  . GERD (gastroesophageal reflux disease)    "hx" (03/17/2013)  . Gout   . Hypertension   . Hypothyroidism   . Myocardial infarction ([place]) 2006  . OSA on CPAP   . Osteoarthrosis, unspecified whether generalized or localized, unspecified site   . Other abnormal blood chemistry   . Overweight(278.02)   . Type II diabetes mellitus  [facility])     Patient Active Problem List   Diagnosis Date Noted  . Acute cholecystitis 03/17/2013  . DM (diabetes mellitus) ([place]) 08/28/2011  . Hypogonadism, male 08/28/2011  . CAD (coronary artery disease)   . Ejection fraction   . HYPERURICEMIA 08/07/2009  . DEGENERATIVE JOINT DISEASE 08/03/2008  . OVERWEIGHT/OBESITY 05/24/2008  . ALLERGIC RHINITIS 06/26/2007  . Obstructive sleep apnea 04/15/2007  . HYPOTHYROIDISM 03/28/2007  . HYPERLIPIDEMIA 03/28/2007  . HYPERTENSION 03/28/2007  . ASTHMATIC BRONCHITIS, ACUTE 03/28/2007    Past Surgical History:  Procedure Laterality Date  . CARDIAC CATHETERIZATION  ?2007  . CHOLECYSTECTOMY N/A 03/18/2013   Procedure: LAPAROSCOPIC CHOLECYSTECTOMY;  Surgeon: [name], MD;  Location: [facility];  Service: General;  Laterality: N/A;  . CORONARY ANGIOPLASTY WITH STENT PLACEMENT  2006 X2   "1 + 1" (03/17/2013)  . SHOULDER OPEN ROTATOR CUFF REPAIR Left ~ 2008       Family History  Problem Relation Age of Onset  . Heart disease Brother     Social History   Tobacco Use  . Smoking status: Former Smoker    Packs/day: 1.00    Years: 15.00    Pack years: 15.00    Types: Cigarettes    Quit date: 08/23/2004    Years since quitting: 15.1  . Smokeless tobacco: Former Systems developer    Types: Loss adjuster, chartered  Quit date: 08/23/2004  Substance Use Topics  . Alcohol use: Yes    Alcohol/week: 4.0 standard drinks    Types: 4 Shots of liquor per week    Comment: 03/17/2013 "couple times/wk I'll have a shot or 2"  . Drug use: No    Home Medications Prior to Admission medications   Medication Sig Start Date End Date Taking? Authorizing Provider  aspirin 325 MG tablet Take 325 mg by mouth daily.     [provider]  clindamycin (CLEOCIN) 300 MG capsule TAKE 1 CAPSULE BY MOUTH THREE TIMES DAILY WITH FOOD FOR FOOT INFECTION 09/15/18   [provider]  fluocinonide-emollient (LIDEX-E) 0.05 % cream Apply 1 application topically as needed.      [provider]  fluticasone (FLONASE) 50 MCG/ACT nasal spray Place 1 spray into both nostrils daily.    Noralee Space, MD  levothyroxine (SYNTHROID, LEVOTHROID) 150 MCG tablet TAKE 1 TABLET BY MOUTH ONCE DAILY 08/17/14   Noralee Space, MD  Multiple Vitamin (MULTIVITAMIN) capsule Take 1 capsule by mouth daily.     [provider]  NEOMYCIN-POLYMYXIN-HYDROCORTISONE (CORTISPORIN) 1 % SOLN OTIC solution Place 2 drops into both ears 2 (two) times daily. 09/18/18   Landis Martins, DPM    Allergies    Ibuprofen  Review of Systems   Review of Systems  Constitutional: Positive for diaphoresis and fatigue. Negative for chills and fever.  HENT: Negative for congestion.   Eyes: Negative for visual disturbance.  Respiratory: Positive for shortness of breath. Negative for cough, chest tightness, wheezing and stridor.   Cardiovascular: Positive for chest pain. Negative for palpitations, leg swelling and near-syncope.  Gastrointestinal: Positive for nausea. Negative for constipation, diarrhea and vomiting.  Genitourinary: Negative for dysuria, flank pain and frequency.  Musculoskeletal: Negative for back pain, neck pain and neck stiffness.  Skin: Negative for wound.  Neurological: Negative for weakness, light-headedness and headaches.  Psychiatric/Behavioral: Negative for agitation and confusion.  All other systems reviewed and are negative.   Physical Exam Updated Vital Signs BP (!) 130/82   Pulse 70   Temp 98.5 F (36.9 C) (Oral)   Resp 17   Ht 6\' 1"  (1.854 m)   Wt (!) 110 kg   SpO2 96%   BMI 31.99 kg/m   Physical Exam Vitals and nursing note reviewed.  Constitutional:      General: He is not in acute distress.    Appearance: He is well-developed. He is not ill-appearing, toxic-appearing or diaphoretic.  HENT:     Head: Normocephalic and atraumatic.  Eyes:     Conjunctiva/sclera: Conjunctivae normal.     Pupils: Pupils are equal, round, and reactive to light.    Cardiovascular:     Rate and Rhythm: Normal rate and regular rhythm.     Heart sounds: Normal heart sounds. No murmur heard.   Pulmonary:     Effort: Pulmonary effort is normal. No tachypnea or respiratory distress.     Breath sounds: Normal breath sounds. No decreased breath sounds, wheezing, rhonchi or rales.  Chest:     Chest wall: No tenderness.  Abdominal:     Palpations: Abdomen is soft.     Tenderness: There is no abdominal tenderness.  Musculoskeletal:     Cervical back: Neck supple.     Right lower leg: No tenderness. No edema.     Left lower leg: No tenderness. No edema.  Skin:    General: Skin is warm and dry.     Capillary  Refill: Capillary refill takes less than 2 seconds.  Neurological:     General: No focal deficit present.     Mental Status: He is alert.  Psychiatric:        Mood and Affect: Mood normal. Mood is not anxious.     ED Results / Procedures / Treatments   Labs (all labs ordered are listed, but only abnormal results are displayed) Labs Reviewed  BASIC METABOLIC PANEL - Abnormal; Notable for the following components:      Result Value   Glucose, Bld 107 (*)    All other components within normal limits  HEMOGLOBIN A1C - Abnormal; Notable for the following components:   Hgb A1c MFr Bld 5.9 (*)    All other components within normal limits  SARS CORONAVIRUS 2 BY RT PCR (HOSPITAL ORDER, [facility])  CBC  PROTIME-INR  HIV ANTIBODY (ROUTINE TESTING W REFLEX)  CBC  CREATININE, SERUM  TSH  MAGNESIUM  TROPONIN I (HIGH SENSITIVITY)  TROPONIN I (HIGH SENSITIVITY)    EKG EKG Interpretation  Date/Time:  Sunday September 27 2019 05:16:47 EDT Ventricular Rate:  72 PR Interval:  208 QRS Duration: 92 QT Interval:  368 QTC Calculation: 402 R Axis:   -14 Text Interpretation: Normal sinus rhythm Normal ECG When compared to prior, no significant changes seen. No STEMI Confirmed by Antony Blackbird [phone]) on 09/27/2019 11:18:27  AM   Radiology DG Chest 2 View  Result Date: 09/27/2019 CLINICAL DATA:  Pt reports that he has had SOB for 18 months and has progressively worsened for last 30 days. Pt reports low stamina and low exertion. Pt has had 2 stents 15 years ago after heart attack. Pt reports stroke 1 month ago. Pt reports pain in neck and pressure in chest. Pt states that he feels he has a blockage in his heart and has an appointment with cardiologist on Monday. Pt denies any other heart or lung problems. EXAM: CHEST - 2 VIEW COMPARISON:  08/27/2011 FINDINGS: Cardiac silhouette is normal in size. Normal mediastinal and hilar contours. Lungs are hyperexpanded. Minor linear scarring in the anterolateral left lung base. Lungs otherwise clear. No pleural effusion or pneumothorax. Skeletal structures are intact IMPRESSION: No active cardiopulmonary disease. Electronically Signed   By: Lajean Manes M.D.   On: 09/27/2019 06:45    Procedures Procedures (including critical care time)  Medications Ordered in ED Medications  sodium chloride flush (NS) 0.9 % injection 3 mL (has no administration in time range)  levothyroxine (SYNTHROID) tablet 150 mcg (has no administration in time range)  metoprolol tartrate (LOPRESSOR) tablet 12.5 mg (has no administration in time range)  aspirin chewable tablet 324 mg (has no administration in time range)    Or  aspirin suppository 300 mg (has no administration in time range)  aspirin EC tablet 81 mg (has no administration in time range)  nitroGLYCERIN (NITROSTAT) SL tablet 0.4 mg (has no administration in time range)  acetaminophen (TYLENOL) tablet 650 mg (has no administration in time range)  ondansetron (ZOFRAN) injection 4 mg (has no administration in time range)  heparin injection 5,000 Units (has no administration in time range)  sodium chloride flush (NS) 0.9 % injection 3 mL (has no administration in time range)  sodium chloride flush (NS) 0.9 % injection 3 mL (has no  administration in time range)  0.9 %  sodium chloride infusion (has no administration in time range)  zolpidem (AMBIEN) tablet 5 mg (has no administration in time range)  ALPRAZolam (XANAX) tablet 0.25 mg (has no administration in time range)  sodium chloride flush (NS) 0.9 % injection 3 mL (has no administration in time range)    ED Course  I have reviewed the triage vital signs and the nursing notes.  Pertinent labs & imaging results that were available during my care of the patient were reviewed by me and considered in my medical decision making (see chart for details).    MDM Rules/Calculators/A&P                          [name] is a 69 y.o. male with a past medical history significant for hypertension, hyperlipidemia, diabetes, CAD status post PCI, and recent stroke who presents with chest pain.  Patient reports that for the last week or so he is been having worsened fatigue and then over the last few days has developed exertional chest pain and shortness of breath.  He reports that it feels "the exact same" as he felt when he needed 2 stents for MI.  He says it is a chest pressure that goes into his throat that is worsened with exertion.  He reports when he tries to ambulate he gets very diaphoretic, short of breath, has to stop.  Rest improves his symptoms.  He reports the pain gets severe.  He denies recent fevers, chills congestion, or cough.  Denies any urinary symptoms or GI symptoms otherwise.  Denies trauma.  He reports that he has no leftover symptoms from his stroke 1 month ago and is now on aspirin and Plavix.  On exam, lungs are clear and chest is nontender, I cannot reproduce the discomfort.  No murmur.  Abdomen nontender.  Patient has no focal neurologic deficits.  Speech is clear.  Patient appears well and is chest pain-free however he reports if he tries to ambulate it will start again.    EKG appears similar to prior.  No STEMI.  Patient has been in the emergency  department for over 6 hours by the time he was in an exam room for evaluation.  Patient had some screening labs performed including an initial negative troponin.  BMP reassuring and CBC also reassuring.  INR is nonelevated.  Vital signs show mild hypertension but otherwise was reassuring.  Due to his report that the symptoms are very exertional and feel the same as his prior MI needing stents, we will call cardiology for recommendations.   Final Clinical Impression(s) / ED Diagnoses Final diagnoses:  Precordial pain    Clinical Impression: 1. Precordial pain     Disposition: Admit  This note was prepared with assistance of Dragon voice recognition software. Occasional wrong-word or sound-a-like substitutions may have occurred due to the inherent limitations of voice recognition software.      Tabius Rood, Gwenyth Allegra, MD 09/27/19 [phone]

## 2019-09-27 NOTE — H&P (Addendum)
Cardiology Admission History and Physical:   Patient ID: [name] MRN: [id number]; DOB: 1950-07-10   Admission date: 09/27/2019  Primary Care Provider: [name], MD [facility] Cardiologist: New, remotely seen by Dr. Torib[facility] Electrophysiologist:  None   Chief Complaint:  Chest pain  Patient Profile:   [name] is a 69 y.o. male with pmh of CAD s/p stent placed on 2006 on 2 separate occasions, cath in 2007 with patent stents, nuclear stress test 2010 with no new ischemia and normal LVEF, HLD, HTN, diet controlled DM2, OSA not on CPAP, hypothyroidism and CVA in 08/2019 treated at [facility] who presents to the ER with chest pain.   History of Present Illness:   Mr. Posten was previously seen by Dr. Ron Parker, last seen in 2013. History reports CAD with DES in 07/2004 and DES to LAD 09/2004 and cath in 2007 with patent stents (report not in epic). Myovue stress test 02/2008 with no ischemia and normal LVEF.  He takes Aspirin 325 mg daily and plavix due to recent stroke. His last A1C reported at 5.7. Says he lost about 30lbs in the last couple months which has helped A1C and BP. He takes metoprolol for BP. He recently complete a zio patch and sent it n for evaluation. Echo ordered but not completed.   The patient presented to the ER 09/27/19 for chest pain. Reports about 6 months of progressive angina. Over the last week he been having increasing episodes of chest pain on minimal exertion. Yesterday he experienced 6/10 chest pain while walking to the car from the grocery store. He has associated sob. This morning he had another episode in which he felt very weak and was diaphoretic. No N/V. Pain radiated up into his throat. Pain is a tightness. It is similar to prior stenting. It improves with rest, resolves about 5-10 minutes. He does not have nitro to take at home. Denies recent illness, fever, chills, abd pain, abnormal BM, LLE. He reports history of OSA but does not use CPAP  because he feels breathing is stable at night.   He does note occasional PND   In the ED BP 130/82, pulse 70, afebrile, RR 17, 96% O2. EKG shows sinus with no acute changes. Labs showed potassium 3.9, glucose 107, creatinine 1.15, WBC 9.4, Hgb 14.6. HS troponin 6>8. CXR unremarkable. Cardiology was consulted for admission.  Note the pt was admitted to [facility] in early July with sudden R sided weakness on 08/28/19.  MRI with acute CVA R motercortex  Started in [facility] protocol and now on daily ASA  He has worn a Zio patch   Just mailed in   Was to have an echo  (Seen by Dr Noralee Stain at [facility])   Past Medical History:  Diagnosis Date  . Allergic rhinitis, cause unspecified   . Arthritis    "probably some form in my joints" (03/17/2013)  . Asthma    "a little" (03/17/2013)  . CAD (coronary artery disease)    DES 07/2004 /   DES LAD 09/2004  /  cath 08/2005 stents patent  /  nuclear 02/2008 no ischemia  . Chronic bronchitis ([place])    "used to get it q yr; haven't had it for the last couple years now" (03/17/2013)  . COPD (chronic obstructive pulmonary disease) ([place])    "a little" (03/17/2013)  . Dyslipidemia   . Ejection fraction    EF 55% cath, 08/2005  /  EF 60% nuclear, 02/2008  .  GERD (gastroesophageal reflux disease)    "hx" (03/17/2013)  . Gout   . Hypertension   . Hypothyroidism   . Myocardial infarction ([place]) 2006  . OSA on CPAP   . Osteoarthrosis, unspecified whether generalized or localized, unspecified site   . Other abnormal blood chemistry   . Overweight(278.02)   . Type II diabetes mellitus ([place])     Past Surgical History:  Procedure Laterality Date  . CARDIAC CATHETERIZATION  ?2007  . CHOLECYSTECTOMY N/A 03/18/2013   Procedure: LAPAROSCOPIC CHOLECYSTECTOMY;  Surgeon: [name], MD;  Location: [facility];  Service: General;  Laterality: N/A;  . CORONARY ANGIOPLASTY WITH STENT PLACEMENT  2006 X2   "1 + 1" (03/17/2013)  . SHOULDER OPEN ROTATOR CUFF REPAIR Left ~ 2008      Medications Prior to Admission: Prior to Admission medications   Medication Sig Start Date End Date Taking? Authorizing Provider  aspirin 325 MG tablet Take 325 mg by mouth daily.     [provider]  clindamycin (CLEOCIN) 300 MG capsule TAKE 1 CAPSULE BY MOUTH THREE TIMES DAILY WITH FOOD FOR FOOT INFECTION 09/15/18   [provider]  fluocinonide-emollient (LIDEX-E) 0.05 % cream Apply 1 application topically as needed.     [provider]  fluticasone (FLONASE) 50 MCG/ACT nasal spray Place 1 spray into both nostrils daily.    Noralee Space, MD  levothyroxine (SYNTHROID, LEVOTHROID) 150 MCG tablet TAKE 1 TABLET BY MOUTH ONCE DAILY 08/17/14   Noralee Space, MD  Multiple Vitamin (MULTIVITAMIN) capsule Take 1 capsule by mouth daily.     [provider]  NEOMYCIN-POLYMYXIN-HYDROCORTISONE (CORTISPORIN) 1 % SOLN OTIC solution Place 2 drops into both ears 2 (two) times daily. 09/18/18   Landis Martins, DPM     Allergies:    Allergies  Allergen Reactions  . Ibuprofen     REACTION: intol to large amounts of ibuprofen    Social History:   Social History   Socioeconomic History  . Marital status: Married    Spouse name: Not on file  . Number of children: Not on file  . Years of education: Not on file  . Highest education level: Not on file  Occupational History  . Not on file  Tobacco Use  . Smoking status: Former Smoker    Packs/day: 1.00    Years: 15.00    Pack years: 15.00    Types: Cigarettes    Quit date: 08/23/2004    Years since quitting: 15.1  . Smokeless tobacco: Former Systems developer    Types: Wann date: 08/23/2004  Substance and Sexual Activity  . Alcohol use: Yes    Alcohol/week: 4.0 standard drinks    Types: 4 Shots of liquor per week    Comment: 03/17/2013 "couple times/wk I'll have a shot or 2"  . Drug use: No  . Sexual activity: Yes  Other Topics Concern  . Not on file  Social History Narrative  . Not on file   Social  Determinants of Health   Financial Resource Strain:   . Difficulty of Paying Living Expenses:   Food Insecurity:   . Worried About Charity fundraiser in the Last Year:   . Arboriculturist in the Last Year:   Transportation Needs:   . Film/video editor (Medical):   Marland Kitchen Lack of Transportation (Non-Medical):   Physical Activity:   . Days of Exercise per Week:   . Minutes of Exercise per Session:  Stress:   . Feeling of Stress :   Social Connections:   . Frequency of Communication with Friends and Family:   . Frequency of Social Gatherings with Friends and Family:   . Attends Religious Services:   . Active Member of Clubs or Organizations:   . Attends Archivist Meetings:   Marland Kitchen Marital Status:   Intimate Partner Violence:   . Fear of Current or Ex-Partner:   . Emotionally Abused:   Marland Kitchen Physically Abused:   . Sexually Abused:     Family History:   The patient's family history includes Heart disease in his brother.    ROS:  Please see the history of present illness.  All other ROS reviewed and negative.     Physical Exam/Data:   Vitals:   09/27/19 0517 09/27/19 0903 09/27/19 0951 09/27/19 1101  BP:  (!) 145/82 (!) 135/84 (!) 130/82  Pulse:  66 69 70  Resp:  17 15 17   Temp:      TempSrc:      SpO2:  98% 96% 96%  Weight: (!) 110 kg     Height: 6\' 1"  (1.854 m)      No intake or output data in the 24 hours ending 09/27/19 1312 Last 3 Weights 09/27/2019 01/05/2014 06/22/2013  Weight (lbs) 242 lb 8.1 oz 251 lb 247 lb 3.2 oz  Weight (kg) 110 kg 113.853 kg 112.129 kg     Body mass index is 31.99 kg/m.  General:  Well nourished, well developed, in no acute distress HEENT: normal Neck: no JVD  No bruits   Endocrine:  No thryomegaly Vascular: No carotid bruits; FA pulses 2+ bilaterally without bruits  Cardiac:  normal S1, S2; RRR; no murmur   PMI not displaced  Lungs:  clear to auscultation bilaterally, no wheezing, rhonchi or rales  Abd: soft, nontender, no  hepatomegaly  Ext: no LE edema Musculoskeletal:  No deformities, BUE and BLE strength normal and equal Skin: warm and dry  Neuro:  CNs 2-12 intact, no focal abnormalities noted Psych:  Normal affect    EKG:  The ECG that was done 09/27/19 was personally reviewed and demonstrates NSR with 1st degree AV block LAD, nonspecific T wave changes  Relevant CV Studies:  Cath report from 2007 unable to find in epic  Laboratory Data:  High Sensitivity Troponin:   Recent Labs  Lab 09/27/19 0523 09/27/19 1133  TROPONINIHS 6 8      Chemistry Recent Labs  Lab 09/27/19 0523  NA 139  K 3.8  CL 101  CO2 26  GLUCOSE 107*  BUN 14  CREATININE 1.15  CALCIUM 9.6  GFRNONAA >60  GFRAA >60  ANIONGAP 12    No results for input(s): PROT, ALBUMIN, AST, ALT, ALKPHOS, BILITOT in the last 168 hours. Hematology Recent Labs  Lab 09/27/19 0523  WBC 9.4  RBC 4.88  HGB 14.6  HCT 45.1  MCV 92.4  MCH 29.9  MCHC 32.4  RDW 13.8  PLT 270   BNPNo results for input(s): BNP, PROBNP in the last 168 hours.  DDimer No results for input(s): DDIMER in the last 168 hours.   Radiology/Studies:  DG Chest 2 View  Result Date: 09/27/2019 CLINICAL DATA:  Pt reports that he has had SOB for 18 months and has progressively worsened for last 30 days. Pt reports low stamina and low exertion. Pt has had 2 stents 15 years ago after heart attack. Pt reports stroke 1 month ago. Pt reports pain in  neck and pressure in chest. Pt states that he feels he has a blockage in his heart and has an appointment with cardiologist on Monday. Pt denies any other heart or lung problems. EXAM: CHEST - 2 VIEW COMPARISON:  08/27/2011 FINDINGS: Cardiac silhouette is normal in size. Normal mediastinal and hilar contours. Lungs are hyperexpanded. Minor linear scarring in the anterolateral left lung base. Lungs otherwise clear. No pleural effusion or pneumothorax. Skeletal structures are intact IMPRESSION: No active cardiopulmonary disease.  Electronically Signed   By: Lajean Manes M.D.   On: 09/27/2019 06:45    TIMI Risk Score for Unstable Angina or Non-ST Elevation MI:   The patient's TIMI risk score is 5, which indicates a 26% risk of all cause mortality, new or recurrent myocardial infarction or need for urgent revascularization in the next 14 days.    Assessment and Plan:   Unstable angina with know history of CAD with remote PCI - History reports prior DES to LAD in 2006 and DES to ?unknown artery also in 2006 with patent stents per cath in 2007 . Unable to locate cath report in epic. - Presents with progressive angina with pain similar to what he had before stentin.  EKG without acute changes   Tropoinin neg x 2     He is on Plavix and ASA 325 with recent CVA Continue for now    Continue metoprolol Will sched echo given CVA as well Set up for cath in AM  Risk / benefits explained   Pt understands and agrees to proceed. He declines taking statins or consideration for Repath  - HTN - Pressures slightly up - continue home metoprolol and follow    HLD - check lipid panel - not on statin due to history of intolerance. Does not want to consider other agents  DM2 - check A1C  Recent CVA Seen at [facility]  MRI on 08/27/19 from [facility] showed Small foci of restricted diffusion noted within the cortical gray and white matter in the region of the left peri-Rolandic cortex. No abnormal associated enhancement or susceptibility weighted signal, or T2 FLAIR signal.  No residual deficits   - aspirin 325 mg daily and plavix   Hypothyroidism - continue levothyroxine - check TSH  OSA - not compliant with CPAP  Severity of Illness: The appropriate patient status for this patient is OBSERVATION. Observation status is judged to be reasonable and necessary in order to provide the required intensity of service to ensure the patient's safety. The patient's presenting symptoms, physical exam findings, and initial radiographic and  laboratory data in the context of their medical condition is felt to place them at decreased risk for further clinical deterioration. Furthermore, it is anticipated that the patient will be medically stable for discharge from the hospital within 2 midnights of admission. The following factors support the patient status of observation.   " The patient's presenting symptoms include chest pain. " The physical exam findings include chest pain. " The initial radiographic and laboratory data are normal troponin.     For questions or updates, please contact [place] Please consult [email or web address] for contact info under     Signed, Cadence Ninfa Meeker, PA-C  09/27/2019 1:12 PM   Pt seen and examined  I reviewed hx with pt and performed physical exam above.   I have amended assessment/plan to reflect my findings  Plan for L heart cath tomorrow.   Dorris Carnes MD

## 2019-09-27 NOTE — ED Notes (Signed)
Pt ambulated to the br with a steady gait. 

## 2019-09-27 NOTE — ED Triage Notes (Signed)
Patient reports intermittent chest pressure with exertional dyspnea this week , no emesis or diaphoresis , history of CAD/Coronary Stents . No cough or fever .

## 2019-09-28 ENCOUNTER — Other Ambulatory Visit: Payer: Self-pay | Admitting: *Deleted

## 2019-09-28 ENCOUNTER — Observation Stay (HOSPITAL_COMMUNITY): Payer: Medicare Other

## 2019-09-28 ENCOUNTER — Encounter (HOSPITAL_COMMUNITY)
Admission: EM | Disposition: A | Discharge status: Home / Self Care | Payer: Self-pay | Source: Home / Self Care | Attending: Cardiothoracic Surgery

## 2019-09-28 ENCOUNTER — Ambulatory Visit: Payer: No Typology Code available for payment source | Admitting: Cardiology

## 2019-09-28 DIAGNOSIS — E8770 Fluid overload, unspecified: Secondary | ICD-10-CM | POA: Diagnosis not present

## 2019-09-28 DIAGNOSIS — K219 Gastro-esophageal reflux disease without esophagitis: Secondary | ICD-10-CM | POA: Diagnosis not present

## 2019-09-28 DIAGNOSIS — E039 Hypothyroidism, unspecified: Secondary | ICD-10-CM | POA: Diagnosis not present

## 2019-09-28 DIAGNOSIS — I25110 Atherosclerotic heart disease of native coronary artery with unstable angina pectoris: Principal | ICD-10-CM

## 2019-09-28 DIAGNOSIS — Z8673 Personal history of transient ischemic attack (TIA), and cerebral infarction without residual deficits: Secondary | ICD-10-CM | POA: Diagnosis not present

## 2019-09-28 DIAGNOSIS — J939 Pneumothorax, unspecified: Secondary | ICD-10-CM | POA: Diagnosis not present

## 2019-09-28 DIAGNOSIS — I200 Unstable angina: Secondary | ICD-10-CM | POA: Diagnosis not present

## 2019-09-28 DIAGNOSIS — Z01810 Encounter for preprocedural cardiovascular examination: Secondary | ICD-10-CM | POA: Diagnosis not present

## 2019-09-28 DIAGNOSIS — G4733 Obstructive sleep apnea (adult) (pediatric): Secondary | ICD-10-CM | POA: Diagnosis present

## 2019-09-28 DIAGNOSIS — E785 Hyperlipidemia, unspecified: Secondary | ICD-10-CM | POA: Diagnosis not present

## 2019-09-28 DIAGNOSIS — I10 Essential (primary) hypertension: Secondary | ICD-10-CM | POA: Diagnosis not present

## 2019-09-28 DIAGNOSIS — Z20822 Contact with and (suspected) exposure to covid-19: Secondary | ICD-10-CM | POA: Diagnosis not present

## 2019-09-28 DIAGNOSIS — Z79899 Other long term (current) drug therapy: Secondary | ICD-10-CM | POA: Diagnosis not present

## 2019-09-28 DIAGNOSIS — Z886 Allergy status to analgesic agent status: Secondary | ICD-10-CM | POA: Diagnosis not present

## 2019-09-28 DIAGNOSIS — M1990 Unspecified osteoarthritis, unspecified site: Secondary | ICD-10-CM | POA: Diagnosis present

## 2019-09-28 DIAGNOSIS — E663 Overweight: Secondary | ICD-10-CM | POA: Diagnosis present

## 2019-09-28 DIAGNOSIS — I2510 Atherosclerotic heart disease of native coronary artery without angina pectoris: Secondary | ICD-10-CM

## 2019-09-28 DIAGNOSIS — J9811 Atelectasis: Secondary | ICD-10-CM | POA: Diagnosis not present

## 2019-09-28 DIAGNOSIS — Z6829 Body mass index (BMI) 29.0-29.9, adult: Secondary | ICD-10-CM | POA: Diagnosis not present

## 2019-09-28 DIAGNOSIS — J90 Pleural effusion, not elsewhere classified: Secondary | ICD-10-CM | POA: Diagnosis not present

## 2019-09-28 DIAGNOSIS — I2584 Coronary atherosclerosis due to calcified coronary lesion: Secondary | ICD-10-CM | POA: Diagnosis present

## 2019-09-28 DIAGNOSIS — I340 Nonrheumatic mitral (valve) insufficiency: Secondary | ICD-10-CM

## 2019-09-28 DIAGNOSIS — Z955 Presence of coronary angioplasty implant and graft: Secondary | ICD-10-CM | POA: Diagnosis not present

## 2019-09-28 DIAGNOSIS — D62 Acute posthemorrhagic anemia: Secondary | ICD-10-CM | POA: Diagnosis not present

## 2019-09-28 DIAGNOSIS — E119 Type 2 diabetes mellitus without complications: Secondary | ICD-10-CM | POA: Diagnosis present

## 2019-09-28 DIAGNOSIS — Z79890 Hormone replacement therapy (postmenopausal): Secondary | ICD-10-CM | POA: Diagnosis not present

## 2019-09-28 DIAGNOSIS — Z7982 Long term (current) use of aspirin: Secondary | ICD-10-CM | POA: Diagnosis not present

## 2019-09-28 DIAGNOSIS — Y713 Surgical instruments, materials and cardiovascular devices (including sutures) associated with adverse incidents: Secondary | ICD-10-CM | POA: Diagnosis present

## 2019-09-28 DIAGNOSIS — I2541 Coronary artery aneurysm: Secondary | ICD-10-CM | POA: Diagnosis present

## 2019-09-28 DIAGNOSIS — T82855A Stenosis of coronary artery stent, initial encounter: Secondary | ICD-10-CM | POA: Diagnosis not present

## 2019-09-28 DIAGNOSIS — J449 Chronic obstructive pulmonary disease, unspecified: Secondary | ICD-10-CM | POA: Diagnosis not present

## 2019-09-28 DIAGNOSIS — I252 Old myocardial infarction: Secondary | ICD-10-CM | POA: Diagnosis not present

## 2019-09-28 HISTORY — PX: LEFT HEART CATH AND CORONARY ANGIOGRAPHY: CATH118249

## 2019-09-28 LAB — PULMONARY FUNCTION TEST
FEF 25-75 Pre: 2.09 L/sec
FEF2575-%Pred-Pre: 73 %
FEV1-%Pred-Pre: 81 %
FEV1-Pre: 3.02 L
FEV1FVC-%Pred-Pre: 92 %
FEV6-%Pred-Pre: 88 %
FEV6-Pre: 4.21 L
FEV6FVC-%Pred-Pre: 101 %
FVC-%Pred-Pre: 87 %
FVC-Pre: 4.39 L
Pre FEV1/FVC ratio: 69 %
Pre FEV6/FVC Ratio: 96 %

## 2019-09-28 LAB — ECHOCARDIOGRAM COMPLETE
AR max vel: 3.29 cm2
AV Area VTI: 2.9 cm2
AV Area mean vel: 3.04 cm2
AV Mean grad: 4 mmHg
AV Peak grad: 6.1 mmHg
Ao pk vel: 1.23 m/s
Area-P 1/2: 3.91 cm2
Height: 73 in
S' Lateral: 3.5 cm
Weight: 3518.4 oz

## 2019-09-28 LAB — BASIC METABOLIC PANEL
Anion gap: 9 (ref 5–15)
BUN: 14 mg/dL (ref 8–23)
CO2: 25 mmol/L (ref 22–32)
Calcium: 8.9 mg/dL (ref 8.9–10.3)
Chloride: 106 mmol/L (ref 98–111)
Creatinine, Ser: 1.07 mg/dL (ref 0.61–1.24)
GFR calc Af Amer: >60 mL/min (ref >60)
GFR calc non Af Amer: >60 mL/min (ref >60)
Glucose, Bld: 86 mg/dL (ref 70–99)
Potassium: 3.7 mmol/L (ref 3.5–5.1)
Sodium: 140 mmol/L (ref 135–145)

## 2019-09-28 LAB — LIPID PANEL
Cholesterol: 216 mg/dL — ABNORMAL HIGH (ref 0–200)
HDL: 45 mg/dL (ref >40)
LDL Cholesterol: 152 mg/dL — ABNORMAL HIGH (ref 0–99)
Total CHOL/HDL Ratio: 4.8 RATIO
Triglycerides: 94 mg/dL (ref <150)
VLDL: 19 mg/dL (ref 0–40)

## 2019-09-28 LAB — CBC
HCT: 42.4 % (ref 39.0–52.0)
Hemoglobin: 13.6 g/dL (ref 13.0–17.0)
MCH: 29.4 pg (ref 26.0–34.0)
MCHC: 32.1 g/dL (ref 30.0–36.0)
MCV: 91.8 fL (ref 80.0–100.0)
Platelets: 215 10*3/uL (ref 150–400)
RBC: 4.62 MIL/uL (ref 4.22–5.81)
RDW: 13.9 % (ref 11.5–15.5)
WBC: 9.2 10*3/uL (ref 4.0–10.5)
nRBC: 0 % (ref 0.0–0.2)

## 2019-09-28 SURGERY — LEFT HEART CATH AND CORONARY ANGIOGRAPHY
Anesthesia: LOCAL

## 2019-09-28 MED ORDER — SODIUM CHLORIDE 0.9 % IV SOLN: INTRAVENOUS | Status: AC

## 2019-09-28 MED ORDER — VERAPAMIL HCL 2.5 MG/ML IV SOLN
INTRAVENOUS | Status: AC
  Filled 2019-09-28: qty 2

## 2019-09-28 MED ORDER — IOHEXOL 350 MG/ML SOLN
INTRAVENOUS | Status: DC | PRN
  Administered 2019-09-28: 70 mL

## 2019-09-28 MED ORDER — LIDOCAINE HCL (PF) 1 % IJ SOLN
INTRAMUSCULAR | Status: AC
  Filled 2019-09-28: qty 30

## 2019-09-28 MED ORDER — HYDRALAZINE HCL 20 MG/ML IJ SOLN: 10.0000 mg | INTRAMUSCULAR | Status: AC | PRN

## 2019-09-28 MED ORDER — HEPARIN SODIUM (PORCINE) 1000 UNIT/ML IJ SOLN
INTRAMUSCULAR | Status: DC | PRN
  Administered 2019-09-28: 5000 [IU] via INTRAVENOUS

## 2019-09-28 MED ORDER — SODIUM CHLORIDE 0.9% FLUSH: 3.0000 mL | INTRAVENOUS | Status: DC | PRN

## 2019-09-28 MED ORDER — HEPARIN SODIUM (PORCINE) 1000 UNIT/ML IJ SOLN
INTRAMUSCULAR | Status: AC
  Filled 2019-09-28: qty 1

## 2019-09-28 MED ORDER — SODIUM CHLORIDE 0.9 % IV SOLN: 250.0000 mL | INTRAVENOUS | Status: DC | PRN

## 2019-09-28 MED ORDER — HEPARIN SODIUM (PORCINE) 5000 UNIT/ML IJ SOLN
5000.0000 [IU] | Freq: Three times a day (TID) | INTRAMUSCULAR | Status: DC
  Administered 2019-09-29 (×3): 5000 [IU] via SUBCUTANEOUS
  Filled 2019-09-28 (×3): qty 1

## 2019-09-28 MED ORDER — FENTANYL CITRATE (PF) 100 MCG/2ML IJ SOLN
INTRAMUSCULAR | Status: DC | PRN
  Administered 2019-09-28: 25 ug via INTRAVENOUS

## 2019-09-28 MED ORDER — HEPARIN (PORCINE) IN NACL 1000-0.9 UT/500ML-% IV SOLN
INTRAVENOUS | Status: DC | PRN
  Administered 2019-09-28 (×2): 500 mL

## 2019-09-28 MED ORDER — SODIUM CHLORIDE 0.9% FLUSH
3.0000 mL | Freq: Two times a day (BID) | INTRAVENOUS | Status: DC
  Administered 2019-09-29 (×2): 3 mL via INTRAVENOUS

## 2019-09-28 MED ORDER — VERAPAMIL HCL 2.5 MG/ML IV SOLN
INTRAVENOUS | Status: DC | PRN
  Administered 2019-09-28: 10 mL via INTRA_ARTERIAL

## 2019-09-28 MED ORDER — LIDOCAINE HCL (PF) 1 % IJ SOLN
INTRAMUSCULAR | Status: DC | PRN
  Administered 2019-09-28: 2 mL

## 2019-09-28 MED ORDER — LABETALOL HCL 5 MG/ML IV SOLN: 10.0000 mg | INTRAVENOUS | Status: AC | PRN

## 2019-09-28 MED ORDER — MIDAZOLAM HCL 2 MG/2ML IJ SOLN
INTRAMUSCULAR | Status: DC | PRN
  Administered 2019-09-28: 1 mg via INTRAVENOUS

## 2019-09-28 MED ORDER — MIDAZOLAM HCL 2 MG/2ML IJ SOLN
INTRAMUSCULAR | Status: AC
  Filled 2019-09-28: qty 2

## 2019-09-28 MED ORDER — FENTANYL CITRATE (PF) 100 MCG/2ML IJ SOLN
INTRAMUSCULAR | Status: AC
  Filled 2019-09-28: qty 2

## 2019-09-28 MED ORDER — HEPARIN (PORCINE) IN NACL 1000-0.9 UT/500ML-% IV SOLN
INTRAVENOUS | Status: AC
  Filled 2019-09-28: qty 1000

## 2019-09-28 SURGICAL SUPPLY — 9 items

## 2019-09-28 NOTE — Progress Notes (Signed)
°  Echocardiogram 2D Echocardiogram has been performed.  Marybelle Killings 09/28/2019, 10:08 AM

## 2019-09-28 NOTE — Consult Note (Signed)
[address]       [place] [address]             [phone]        [name]  Medical Record #[id number] Date of Birth: 1950/11/18  Referring: No ref. provider found Primary Care: [name], MD Primary Cardiologist:No primary care provider on file.  Chief Complaint:    Chief Complaint  Patient presents with  . Chest Pain    History of Present Illness:    We are asked to see this patient in cardiothoracic surgical consultation.  The patient is a 69 year old male with a previous history of coronary artery disease status post stent placement in 2006 on 2 separate occasions.  Cardiac catheterization done in 2007 revealed patent stents at that time.  A nuclear stress test done in 2010 showed no evidence of ischemia and normal LVEF.  Patient presented to the emergency department yesterday with chief complaint of chest pain.  He describes approximately 6 months of progressive angina symptoms.  Over the last week the episodes have been increasing and he was developing chest discomfort with only minimal exertion.  On the day prior to admission he had an episode he described as 6/10 chest pain while walking from his car to the grocery store.  He also had associated shortness of breath.  On the morning of presentation he had a similar episode which was also associated with significant weakness and diaphoresis.  The chest discomfort did radiate into his throat.  He described the pain as a tightness and very similar to his chest discomfort prior to previous stent placement.  The symptoms would resolve after approximately 5 to 10 minutes with rest.  He denied any other recent illness.  He does have multiple cardiac comorbidities including hyperlipidemia, hypertension, type 2 diabetes mellitus and obstructive sleep apnea although he reports he does not use his CPAP.  He also has had a CVA recently for which she was treated at [facility].  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  He had right-sided weakness but symptoms resolved very quickly with full resolution.  He was started on the chance protocol and was also on a daily aspirin.  He is also wore a Zio patch but final results are still pending from this study.  He was also on Plavix.  The patient was seen and admitted by cardiology for further evaluation and treatment including cardiac catheterization which was performed on today's date.  He is found to have severe three-vessel coronary artery disease and we are asked to consult for consideration of coronary artery surgical revascularization.  Please see the full catheterization report echocardiogram was also performed on today's date.  It shows left ventricular ejection fraction by 3D volume estimated at 56%.  Please also see the full report listed below.  He would require a Plavix washout prior to proceeding with surgical intervention.    Current Activity/ Functional Status: Patient is independent with mobility/ambulation, transfers, ADL's, IADL's.   Zubrod Score: At the time of surgery this patient's most appropriate activity status/level should be described as: []     0    Normal activity, no symptoms [x]     1    Restricted in physical strenuous activity but ambulatory, able to do out light work []     2    Ambulatory and capable of self care, unable to do work activities, up and about  more than 50%  Of the time                            []     3    Only limited self care, in bed greater than 50% of waking hours []     4    Completely disabled, no self care, confined to bed or chair []     5    Moribund  Past Medical History:  Diagnosis Date  . Allergic rhinitis, cause unspecified   . Arthritis    "probably some form in my joints" (03/17/2013)  . Asthma    "a little" (03/17/2013)  . CAD (coronary artery disease)    DES 07/2004 /   DES LAD 09/2004  /  cath 08/2005 stents patent  /  nuclear 02/2008 no ischemia  .  Chronic bronchitis ([place])    "used to get it q yr; haven't had it for the last couple years now" (03/17/2013)  . COPD (chronic obstructive pulmonary disease) ([place])    "a little" (03/17/2013)  . Dyslipidemia   . Ejection fraction    EF 55% cath, 08/2005  /  EF 60% nuclear, 02/2008  . GERD (gastroesophageal reflux disease)    "hx" (03/17/2013)  . Gout   . Hypertension   . Hypothyroidism   . Myocardial infarction ([place]) 2006  . OSA on CPAP   . Osteoarthrosis, unspecified whether generalized or localized, unspecified site   . Other abnormal blood chemistry   . Overweight(278.02)   . Type II diabetes mellitus ([place])     Past Surgical History:  Procedure Laterality Date  . CARDIAC CATHETERIZATION  ?2007  . CHOLECYSTECTOMY N/A 03/18/2013   Procedure: LAPAROSCOPIC CHOLECYSTECTOMY;  Surgeon: [name], MD;  Location: [facility];  Service: General;  Laterality: N/A;  . CORONARY ANGIOPLASTY WITH STENT PLACEMENT  2006 X2   "1 + 1" (03/17/2013)  . SHOULDER OPEN ROTATOR CUFF REPAIR Left ~ 2008    Social History   Tobacco Use  Smoking Status Former Smoker  . Packs/day: 1.00  . Years: 15.00  . Pack years: 15.00  . Types: Cigarettes  . Quit date: 08/23/2004  . Years since quitting: 15.1  Smokeless Tobacco Former Systems developer  . Types: Chew  . Quit date: 08/23/2004    Social History   Substance and Sexual Activity  Alcohol Use Yes  . Alcohol/week: 4.0 standard drinks  . Types: 4 Shots of liquor per week   Comment: 03/17/2013 "couple times/wk I'll have a shot or 2"     Allergies  Allergen Reactions  . Ibuprofen     REACTION: intol to large amounts of ibuprofen    Current Facility-Administered Medications  Medication Dose Route Frequency Provider Last Rate Last Admin  . 0.9 %  sodium chloride infusion  250 mL Intravenous PRN Lauree Chandler D, MD      . 0.9 %  sodium chloride infusion   Intravenous Continuous Lauree Chandler D, MD      . 0.9 %  sodium chloride infusion  250 mL  Intravenous PRN [name], MD      . acetaminophen (TYLENOL) tablet 650 mg  650 mg Oral Q4H PRN [name], MD      . ALPRAZolam Duanne Moron) tablet 0.25 mg  0.25 mg Oral BID PRN [name], MD      . aspirin EC tablet 81 mg  81 mg Oral Daily [name], MD      . [  START ON 09/29/2019] heparin injection 5,000 Units  5,000 Units Subcutaneous Q8H Lauree Chandler D, MD      . hydrALAZINE (APRESOLINE) injection 10 mg  10 mg Intravenous Q20 Min PRN [name], MD      . labetalol (NORMODYNE) injection 10 mg  10 mg Intravenous Q10 min PRN [name], MD      . levothyroxine (SYNTHROID) tablet 150 mcg  150 mcg Oral Daily [name], MD   150 mcg at 09/28/19 3664  . metoprolol tartrate (LOPRESSOR) tablet 12.5 mg  12.5 mg Oral BID [name], MD   12.5 mg at 09/28/19 0811  . nitroGLYCERIN (NITROSTAT) SL tablet 0.4 mg  0.4 mg Sublingual Q5 Min x 3 PRN [name], MD      . ondansetron [facility]) injection 4 mg  4 mg Intravenous Q6H PRN [name], MD      . sodium chloride flush (NS) 0.9 % injection 3 mL  3 mL Intravenous Once Lauree Chandler D, MD      . sodium chloride flush (NS) 0.9 % injection 3 mL  3 mL Intravenous Q12H [name], MD   3 mL at 09/27/19 2132  . sodium chloride flush (NS) 0.9 % injection 3 mL  3 mL Intravenous PRN Lauree Chandler D, MD      . sodium chloride flush (NS) 0.9 % injection 3 mL  3 mL Intravenous Q12H [name], MD   3 mL at 09/27/19 2132  . sodium chloride flush (NS) 0.9 % injection 3 mL  3 mL Intravenous Q12H Lauree Chandler D, MD      . sodium chloride flush (NS) 0.9 % injection 3 mL  3 mL Intravenous PRN [name], MD      . zolpidem (AMBIEN) tablet 5 mg  5 mg Oral QHS PRN [name], MD        Medications Prior to Admission  Medication Sig Dispense Refill Last Dose  .  aspirin 325 MG tablet Take 325 mg by mouth daily.    09/26/2019 at Unknown time  . cholecalciferol (VITAMIN D3) 25 MCG (1000 UNIT) tablet Take 1,000 Units by mouth daily.   09/26/2019 at Unknown time  . clopidogrel (PLAVIX) 75 MG tablet Take 75 mg by mouth daily.   09/26/2019 at Unknown time  . co-enzyme Q-10 30 MG capsule Take 30 mg by mouth daily.   09/26/2019 at Unknown time  . fluticasone (FLONASE) 50 MCG/ACT nasal spray Place 1 spray into both nostrils daily. (Patient taking differently: Place 1 spray into both nostrils daily as needed for allergies. ) 16 g 3 unknown  . levothyroxine (SYNTHROID, LEVOTHROID) 150 MCG tablet TAKE 1 TABLET BY MOUTH ONCE DAILY 90 tablet 3 09/26/2019 at Unknown time  . metoprolol succinate (TOPROL-XL) 50 MG 24 hr tablet Take 50 mg by mouth daily.   09/26/2019 at 1400  . Multiple Vitamin (MULTIVITAMIN) capsule Take 1 capsule by mouth daily.    Past Week at Unknown time  . vitamin C (ASCORBIC ACID) 250 MG tablet Take 250 mg by mouth daily.   09/26/2019 at Unknown time    Family History  Problem Relation Age of Onset  . Heart disease Brother      Review of Systems:   Review of Systems  Constitutional: Positive for diaphoresis, malaise/fatigue and weight loss. Negative for chills and fever.       Intermit fasting  HENT: Positive for tinnitus. Negative for congestion, ear  discharge, ear pain, hearing loss, nosebleeds, sinus pain and sore throat.   Eyes: Negative for blurred vision, double vision, photophobia, pain, discharge and redness.  Respiratory: Positive for shortness of breath. Negative for cough, hemoptysis, sputum production, wheezing and stridor.   Cardiovascular: Positive for chest pain and palpitations. Negative for orthopnea, claudication, leg swelling and PND.  Gastrointestinal: Negative for abdominal pain, blood in stool, constipation, diarrhea, heartburn, melena, nausea and vomiting.  Genitourinary: Positive for frequency. Negative for dysuria, flank  pain, hematuria and urgency.  Musculoskeletal: Positive for joint pain. Negative for back pain, falls, myalgias and neck pain.       Right shoulder issue, needs surgery per patient  Skin: Negative for itching and rash.  Neurological: Positive for dizziness. Negative for tingling, tremors, sensory change, speech change, focal weakness, seizures, loss of consciousness, weakness and headaches.  Endo/Heme/Allergies: Positive for environmental allergies. Negative for polydipsia. Does not bruise/bleed easily.  Psychiatric/Behavioral: Positive for depression. Negative for hallucinations, memory loss, substance abuse and suicidal ideas. The patient is nervous/anxious. The patient does not have insomnia.       Physical Exam: BP (!) 154/93   Pulse 90   Temp 97.8 F (36.6 C) (Oral)   Resp 12   Ht 6\' 1"  (1.854 m)   Wt (!) 99.7 kg   SpO2 98%   BMI 29.01 kg/m    Physical Exam  Constitutional: He appears healthy. No distress.  HENT:  Mouth/Throat: Oropharynx is clear.  Full dentures  Eyes: Pupils are equal, round, and reactive to light. Conjunctivae are normal.  Neck: Thyroid normal. No JVD present. No neck adenopathy. No thyromegaly present.  Cardiovascular: Normal rate, regular rhythm, normal heart sounds, intact distal pulses and normal pulses. Exam reveals no gallop.  Pulmonary/Chest: Breath sounds normal. He has no wheezes. He has no rales. He exhibits no tenderness.  Abdominal: Soft. Bowel sounds are normal. He exhibits no distension and no mass. There is no abdominal tenderness.  Musculoskeletal:        General: No tenderness or edema.     Cervical back: Normal range of motion and neck supple.  Neurological: He is alert and oriented to person, place, and time. He has normal motor skills.  Skin: Skin is warm and dry. No rash noted. No cyanosis. No jaundice or pallor. Nails show no clubbing.    Diagnostic Studies & Laboratory data:     Recent Radiology Findings:   DG Chest 2  View  Result Date: 09/27/2019 CLINICAL DATA:  Pt reports that he has had SOB for 18 months and has progressively worsened for last 30 days. Pt reports low stamina and low exertion. Pt has had 2 stents 15 years ago after heart attack. Pt reports stroke 1 month ago. Pt reports pain in neck and pressure in chest. Pt states that he feels he has a blockage in his heart and has an appointment with cardiologist on Monday. Pt denies any other heart or lung problems. EXAM: CHEST - 2 VIEW COMPARISON:  08/27/2011 FINDINGS: Cardiac silhouette is normal in size. Normal mediastinal and hilar contours. Lungs are hyperexpanded. Minor linear scarring in the anterolateral left lung base. Lungs otherwise clear. No pleural effusion or pneumothorax. Skeletal structures are intact IMPRESSION: No active cardiopulmonary disease. Electronically Signed   By: Lajean Manes M.D.   On: 09/27/2019 06:45   CARDIAC CATHETERIZATION  Result Date: 09/28/2019  Mid LAD lesion is 80% stenosed.  1st Diag lesion is 60% stenosed.  Ost LAD to Prox LAD lesion is  70% stenosed.  Prox Cx lesion is 70% stenosed.  Prox RCA lesion is 30% stenosed.  Mid RCA lesion is 80% stenosed.  Dist RCA lesion is 99% stenosed.  RPAV lesion is 90% stenosed.  RPDA lesion is 99% stenosed.  1. Severe triple vessel CAD 2. The LAD is a large vessel that courses to the apex. The proximal LAD has a severe stenosis leading into a large aneurysmal segment in the mid LAD. The aneurysmal segment arises at the takeoff of a moderate to large caliber Diagonal branch. The mid LAD stented segment has severe restenosis. The Diagonal branch is a moderate to large caliber bifurcating vessel with moderate proximal stenosis. 3. The Circumflex is a non-dominant moderate caliber vessel with severe proximal stenosis. 4. The RCA is a large dominant vessel with a patent proximal stent with minimal restenosis. The mid and distal vessel has severe disease with two focal lesions. The PDA is a  moderate caliber vessel with severe proximal stenosis. The posterolateral artery has severe disease. Recommendations: Will consult CT surgery for CABG. Plavix has been held. Will need Plavix washout prior to surgery. Continue ASA and statin.   ECHOCARDIOGRAM COMPLETE  Result Date: 09/28/2019    ECHOCARDIOGRAM REPORT   Patient Name:   [name] Date of Exam: 09/28/2019 Medical Rec #:  [id number]       Height:       73.0 in Accession #:    [id number]      Weight:       219.9 lb Date of Birth:  Sep 19, 1950       BSA:          2.240 m Patient Age:    67 years        BP:           135/73 mmHg Patient Gender: M               HR:           80 bpm. Exam Location:  Inpatient Procedure: 2D Echo, Cardiac Doppler and Color Doppler Indications:    Chest pain  History:        Patient has no prior history of Echocardiogram examinations. CAD                 and Previous Myocardial Infarction; Risk Factors:Dyslipidemia,                 Hypertension, Sleep Apnea, Diabetes and Former Smoker.  Sonographer:    Clayton Lefort RDCS (AE) Referring Phys: [id number] CADENCE H FURTH  Sonographer Comments: Suboptimal subcostal window. IMPRESSIONS  1. Left ventricular ejection fraction by 3D volume is 56 %. The left ventricle has normal function. Left ventricular endocardial border not optimally defined to detect subtle regional wall motion abnormalities, however, wall motion appears grossly normal. There is moderate left ventricular hypertrophy. Left ventricular diastolic parameters were normal.  2. Right ventricular systolic function is normal. The right ventricular size is normal. Tricuspid regurgitation signal is inadequate for assessing PA pressure.  3. Left atrial size was mildly dilated.  4. The mitral valve is degenerative. Mild mitral valve regurgitation. No evidence of mitral stenosis.  5. The aortic valve is tricuspid. Aortic valve regurgitation is trivial. Mild aortic valve sclerosis is present, with no evidence of aortic valve  stenosis.  6. There is borderline dilatation of the ascending aorta measuring 39 mm. FINDINGS  Left Ventricle: Left ventricular ejection fraction by 3D volume is 56 %. The left ventricle has normal  function. Left ventricular endocardial border not optimally defined to evaluate regional wall motion. The left ventricular internal cavity size was normal in size. There is moderate left ventricular hypertrophy. Left ventricular diastolic parameters were normal. Right Ventricle: The right ventricular size is normal. No increase in right ventricular wall thickness. Right ventricular systolic function is normal. Tricuspid regurgitation signal is inadequate for assessing PA pressure. Left Atrium: Left atrial size was mildly dilated. Right Atrium: Right atrial size was normal in size. Pericardium: There is no evidence of pericardial effusion. Mitral Valve: The mitral valve is degenerative in appearance. Normal mobility of the mitral valve leaflets. Mild mitral valve regurgitation. No evidence of mitral valve stenosis. MV peak gradient, 2.7 mmHg. The mean mitral valve gradient is 1.0 mmHg. Tricuspid Valve: The tricuspid valve is normal in structure. Tricuspid valve regurgitation is trivial. No evidence of tricuspid stenosis. Aortic Valve: The aortic valve is tricuspid. Aortic valve regurgitation is trivial. Mild aortic valve sclerosis is present, with no evidence of aortic valve stenosis. There is mild calcification of the aortic valve. Aortic valve mean gradient measures 4.0 mmHg. Aortic valve peak gradient measures 6.1 mmHg. Aortic valve area, by VTI measures 2.90 cm. Pulmonic Valve: The pulmonic valve was normal in structure. Pulmonic valve regurgitation is not visualized. No evidence of pulmonic stenosis. Aorta: The aortic root is normal in size and structure. There is borderline dilatation of the ascending aorta measuring 39 mm. Venous: The inferior vena cava was not well visualized. IAS/Shunts: The interatrial septum  was not well visualized.  LEFT VENTRICLE PLAX 2D LVIDd:         4.40 cm         Diastology LVIDs:         3.50 cm         LV e' lateral:   9.46 cm/s LV PW:         1.30 cm         LV E/e' lateral: 7.7 LV IVS:        1.50 cm         LV e' medial:    8.59 cm/s LVOT diam:     2.40 cm         LV E/e' medial:  8.4 LV SV:         89 LV SV Index:   40 LVOT Area:     4.52 cm        3D Volume EF                                LV 3D EF:    Left                                             ventricular                                             ejection                                             fraction by  3D volume                                             is 56 %.                                 3D Volume EF:                                3D EF:        56 %                                LV EDV:       200 ml                                LV ESV:       88 ml                                LV SV:        111 ml RIGHT VENTRICLE RV Basal diam:  4.10 cm RV Mid diam:    3.00 cm RV S prime:     9.03 cm/s TAPSE (M-mode): 1.6 cm LEFT ATRIUM              Index       RIGHT ATRIUM           Index LA diam:        3.40 cm  1.52 cm/m  RA Area:     20.30 cm LA Vol (A2C):   104.0 ml 46.43 ml/m RA Volume:   52.30 ml  23.35 ml/m LA Vol (A4C):   57.4 ml  25.62 ml/m LA Biplane Vol: 83.3 ml  37.19 ml/m  AORTIC VALVE AV Area (Vmax):    3.29 cm AV Area (Vmean):   3.04 cm AV Area (VTI):     2.90 cm AV Vmax:           123.00 cm/s AV Vmean:          93.100 cm/s AV VTI:            0.307 m AV Peak Grad:      6.1 mmHg AV Mean Grad:      4.0 mmHg LVOT Vmax:         89.40 cm/s LVOT Vmean:        62.500 cm/s LVOT VTI:          0.197 m LVOT/AV VTI ratio: 0.64  AORTA Ao Root diam: 3.80 cm Ao Asc diam:  3.90 cm MITRAL VALVE MV Area (PHT): 3.91 cm    SHUNTS MV Peak grad:  2.7 mmHg    Systemic VTI:  0.20 m MV Mean grad:  1.0 mmHg    Systemic Diam: 2.40 cm MV Vmax:       0.82 m/s MV Vmean:      51.3 cm/s  MV Decel Time: 194 msec MV E velocity: 72.40 cm/s MV A velocity: 58.70 cm/s MV E/A ratio:  1.23 Cherlynn Kaiser MD Electronically signed by Nadean Corwin  Margaretann Loveless MD Signature Date/Time: 09/28/2019/2:41:05 PM    Final      I have independently reviewed the above radiologic studies and discussed with the patient   Recent Lab Findings: Lab Results  Component Value Date   WBC 9.2 09/28/2019   HGB 13.6 09/28/2019   HCT 42.4 09/28/2019   PLT 215 09/28/2019   GLUCOSE 86 09/28/2019   CHOL 216 (H) 09/28/2019   TRIG 94 09/28/2019   HDL 45 09/28/2019   LDLDIRECT 138.6 08/27/2011   LDLCALC 152 (H) 09/28/2019   ALT 12 06/22/2013   AST 21 06/22/2013   NA 140 09/28/2019   K 3.7 09/28/2019   CL 106 09/28/2019   CREATININE 1.07 09/28/2019   BUN 14 09/28/2019   CO2 25 09/28/2019   TSH 1.422 09/27/2019   INR 0.9 09/27/2019   HGBA1C 5.9 (H) 09/27/2019      Assessment / Plan: Progressive angina with severe three-vessel coronary disease. Type 2 diabetes mellitus Overweight-has lost 30 pounds in approximately last 6 months by intermittent fasting Obstructive sleep apnea-does not use CPAP due to inability to maintain a seal with mask he feels related to his dentures. Remote myocardial infarction 2006 status post drug-eluting stent x2 Hypothyroidism Hypertension History of gout History of gastroesophageal reflux disease Hyperlipidemia Remote smoker, quit 2006 at the time of his MI COPD/asthma Arthritis Recent CVA  Severe three-vessel coronary disease with progressive angina, appears to be a good candidate for surgical revascularization the patient and his studies will be reviewed by Dr. Orvan Seen and final determination of timing of the surgery will be made following Plavix washout.     I  spent 60 minutes counseling the patient face to face.   John Giovanni, PA-C 09/28/2019 3:10 PM

## 2019-09-28 NOTE — H&P (View-Only) (Signed)
Progress Note  Patient Name: [name] Date of Encounter: 09/28/2019  Primary Cardiologist:  New, remotely seen by Dr. Ron Parker  Subjective   Feeling well.  No recurrent chest pain.  Anticipate cardiac catheterization today.  Inpatient Medications    Scheduled Meds: . aspirin  324 mg Oral NOW   Or  . aspirin  300 mg Rectal NOW  . aspirin EC  81 mg Oral Daily  . heparin  5,000 Units Subcutaneous Q8H  . levothyroxine  150 mcg Oral Daily  . metoprolol tartrate  12.5 mg Oral BID  . sodium chloride flush  3 mL Intravenous Once  . sodium chloride flush  3 mL Intravenous Q12H  . sodium chloride flush  3 mL Intravenous Q12H   Continuous Infusions: . sodium chloride    . sodium chloride    . sodium chloride 1 mL/kg/hr (09/28/19 0528)   PRN Meds: sodium chloride, sodium chloride, acetaminophen, ALPRAZolam, nitroGLYCERIN, ondansetron (ZOFRAN) IV, sodium chloride flush, sodium chloride flush, zolpidem   Vital Signs    Vitals:   09/27/19 1719 09/27/19 1946 09/27/19 2129 09/28/19 0423  BP: (!) 132/80 (!) 132/77  (!) 135/73  Pulse: 67 80 76 80  Resp: 20 18  18   Temp: 97.9 F (36.6 C) 98.7 F (37.1 C)  98.6 F (37 C)  TempSrc: Oral Oral  Oral  SpO2: 95% 98%  98%  Weight:    (!) 99.7 kg  Height:        Intake/Output Summary (Last 24 hours) at 09/28/2019 0746 Last data filed at 09/27/2019 2132 Gross per 24 hour  Intake 3 ml  Output 600 ml  Net -597 ml   Filed Weights   09/27/19 0517 09/28/19 0423  Weight: (!) 110 kg (!) 99.7 kg    Physical Exam   General: Well developed, well nourished, NAD Lungs:Clear to ausculation bilaterally. No wheezes, rales, or rhonchi. Breathing is unlabored. Cardiovascular: RRR with S1 S2. No murmurs Abdomen: Soft, non-tender, non-distended. No obvious abdominal masses. Extremities: No edema.  Radial pulses 2+ bilaterally Neuro: Alert and oriented. No focal deficits. No facial asymmetry. MAE spontaneously. Psych: Responds to questions  appropriately with normal affect.    Labs    Chemistry Recent Labs  Lab 09/27/19 0523 09/27/19 1347 09/28/19 0348  NA 139  --  140  K 3.8  --  3.7  CL 101  --  106  CO2 26  --  25  GLUCOSE 107*  --  86  BUN 14  --  14  CREATININE 1.15 1.06 1.07  CALCIUM 9.6  --  8.9  GFRNONAA >60 >60 >60  GFRAA >60 >60 >60  ANIONGAP 12  --  9     Hematology Recent Labs  Lab 09/27/19 0523 09/27/19 1347 09/28/19 0348  WBC 9.4 8.9 9.2  RBC 4.88 5.07 4.62  HGB 14.6 15.1 13.6  HCT 45.1 46.9 42.4  MCV 92.4 92.5 91.8  MCH 29.9 29.8 29.4  MCHC 32.4 32.2 32.1  RDW 13.8 13.9 13.9  PLT 270 262 215    Cardiac EnzymesNo results for input(s): TROPONINI in the last 168 hours. No results for input(s): TROPIPOC in the last 168 hours.   BNPNo results for input(s): BNP, PROBNP in the last 168 hours.   DDimer No results for input(s): DDIMER in the last 168 hours.   Radiology    DG Chest 2 View  Result Date: 09/27/2019 CLINICAL DATA:  Pt reports that he has had SOB for 18  months and has progressively worsened for last 30 days. Pt reports low stamina and low exertion. Pt has had 2 stents 15 years ago after heart attack. Pt reports stroke 1 month ago. Pt reports pain in neck and pressure in chest. Pt states that he feels he has a blockage in his heart and has an appointment with cardiologist on Monday. Pt denies any other heart or lung problems. EXAM: CHEST - 2 VIEW COMPARISON:  08/27/2011 FINDINGS: Cardiac silhouette is normal in size. Normal mediastinal and hilar contours. Lungs are hyperexpanded. Minor linear scarring in the anterolateral left lung base. Lungs otherwise clear. No pleural effusion or pneumothorax. Skeletal structures are intact IMPRESSION: No active cardiopulmonary disease. Electronically Signed   By: Lajean Manes M.D.   On: 09/27/2019 06:45   Telemetry    09/28/19 NSR - Personally Reviewed  ECG    No new tracing as of 09/28/19- Personally Reviewed  Cardiac Studies    Echocardiogram: Pending  LHC: Pending  Patient Profile     69 y.o. male with pmh of CAD s/p stent placed on 2006 on 2 separate occasions, cath in 2007 with patent stents, nuclear stress test 2010 with no new ischemia and normal LVEF, HLD, HTN, diet controlled DM2, OSA not on CPAP, hypothyroidism and CVA in 08/2019 treated at [facility] who presents to the ER with chest pain.   Assessment & Plan    1. Unstable angina with know history of CAD with remote PCI: -Patient presented with progressive angina, similar to prior MI  -CAD history with remote stenting in 2006  -Continue ASA, Plavix >> recent CVA  -Echocardiogram>> pending -Plan for [facility] today for further coronary evaluation -Denies recurrent chest pain overnight  2. HTN: -Stable, 135/73, 132/77, 132/80 -Continue metoprolol 12.5 twice daily  3. HLD: -Last LDL, 152 -known history of statin intolerance -patient does not want to consider other agents at this time  4. DM2: -Hemoglobin A1c, 5.9 -Well-controlled  5. Recent CVA>> followed at [facility]: -MRI August 27, 2019 with small foci of restricted diffusion noted within the cortical gray and white matter in the region of the left peri-Rolandic cortex. No abnormal associated enhancement or susceptibility weighted signal, or T2 FLAIR signal.  -No residual deficits   -Continue Aspirin 325 mg daily and Plavix  6. Hypothyroidism: -Continue levothyroxine  7. OSA: -Not compliant with CPAP   Signed, Kathyrn Drown NP-C [facility] Pager: [phone] 09/28/2019, 7:46 AM     For questions or updates, please contact   Please consult [email or web address] for contact info under Cardiology/STEMI.  Pt seen and examined  Agree with findings as noted by   He is currently pain free  On exam, Lungs are CTA  Cardiac RRR  No S3   Abd supple Ext without edema  I have reviewed echo  LVEf and RVEF are normal   Mild MR   Plan for cath today  Dorris Carnes

## 2019-09-28 NOTE — Interval H&P Note (Signed)
History and Physical Interval Note:  09/28/2019 2:03 PM  [name]  has presented today for surgery, with the diagnosis of nstemi.  The various methods of treatment have been discussed with the patient and family. After consideration of risks, benefits and other options for treatment, the patient has consented to  Procedure(s): LEFT HEART CATH AND CORONARY ANGIOGRAPHY (N/A) as a surgical intervention.  The patient's history has been reviewed, patient examined, no change in status, stable for surgery.  I have reviewed the patient's chart and labs.  Questions were answered to the patient's satisfaction.    Cath Lab Visit (complete for each Cath Lab visit)  Clinical Evaluation Leading to the Procedure:   ACS: No.  Non-ACS:    Anginal Classification: CCS III  Anti-ischemic medical therapy: Minimal Therapy (1 class of medications)  Non-Invasive Test Results: No non-invasive testing performed  Prior CABG: No previous CABG        Lauree Chandler

## 2019-09-28 NOTE — Progress Notes (Addendum)
Progress Note  Patient Name: [name] Date of Encounter: 09/28/2019  Primary Cardiologist:  New, remotely seen by Dr. Ron Parker  Subjective   Feeling well.  No recurrent chest pain.  Anticipate cardiac catheterization today.  Inpatient Medications    Scheduled Meds: . aspirin  324 mg Oral NOW   Or  . aspirin  300 mg Rectal NOW  . aspirin EC  81 mg Oral Daily  . heparin  5,000 Units Subcutaneous Q8H  . levothyroxine  150 mcg Oral Daily  . metoprolol tartrate  12.5 mg Oral BID  . sodium chloride flush  3 mL Intravenous Once  . sodium chloride flush  3 mL Intravenous Q12H  . sodium chloride flush  3 mL Intravenous Q12H   Continuous Infusions: . sodium chloride    . sodium chloride    . sodium chloride 1 mL/kg/hr (09/28/19 0528)   PRN Meds: sodium chloride, sodium chloride, acetaminophen, ALPRAZolam, nitroGLYCERIN, ondansetron (ZOFRAN) IV, sodium chloride flush, sodium chloride flush, zolpidem   Vital Signs    Vitals:   09/27/19 1719 09/27/19 1946 09/27/19 2129 09/28/19 0423  BP: (!) 132/80 (!) 132/77  (!) 135/73  Pulse: 67 80 76 80  Resp: 20 18  18   Temp: 97.9 F (36.6 C) 98.7 F (37.1 C)  98.6 F (37 C)  TempSrc: Oral Oral  Oral  SpO2: 95% 98%  98%  Weight:    (!) 99.7 kg  Height:        Intake/Output Summary (Last 24 hours) at 09/28/2019 0746 Last data filed at 09/27/2019 2132 Gross per 24 hour  Intake 3 ml  Output 600 ml  Net -597 ml   Filed Weights   09/27/19 0517 09/28/19 0423  Weight: (!) 110 kg (!) 99.7 kg    Physical Exam   General: Well developed, well nourished, NAD Lungs:Clear to ausculation bilaterally. No wheezes, rales, or rhonchi. Breathing is unlabored. Cardiovascular: RRR with S1 S2. No murmurs Abdomen: Soft, non-tender, non-distended. No obvious abdominal masses. Extremities: No edema.  Radial pulses 2+ bilaterally Neuro: Alert and oriented. No focal deficits. No facial asymmetry. MAE spontaneously. Psych: Responds to questions  appropriately with normal affect.    Labs    Chemistry Recent Labs  Lab 09/27/19 0523 09/27/19 1347 09/28/19 0348  NA 139  --  140  K 3.8  --  3.7  CL 101  --  106  CO2 26  --  25  GLUCOSE 107*  --  86  BUN 14  --  14  CREATININE 1.15 1.06 1.07  CALCIUM 9.6  --  8.9  GFRNONAA >60 >60 >60  GFRAA >60 >60 >60  ANIONGAP 12  --  9     Hematology Recent Labs  Lab 09/27/19 0523 09/27/19 1347 09/28/19 0348  WBC 9.4 8.9 9.2  RBC 4.88 5.07 4.62  HGB 14.6 15.1 13.6  HCT 45.1 46.9 42.4  MCV 92.4 92.5 91.8  MCH 29.9 29.8 29.4  MCHC 32.4 32.2 32.1  RDW 13.8 13.9 13.9  PLT 270 262 215    Cardiac EnzymesNo results for input(s): TROPONINI in the last 168 hours. No results for input(s): TROPIPOC in the last 168 hours.   BNPNo results for input(s): BNP, PROBNP in the last 168 hours.   DDimer No results for input(s): DDIMER in the last 168 hours.   Radiology    DG Chest 2 View  Result Date: 09/27/2019 CLINICAL DATA:  Pt reports that he has had SOB for 18  months and has progressively worsened for last 30 days. Pt reports low stamina and low exertion. Pt has had 2 stents 15 years ago after heart attack. Pt reports stroke 1 month ago. Pt reports pain in neck and pressure in chest. Pt states that he feels he has a blockage in his heart and has an appointment with cardiologist on Monday. Pt denies any other heart or lung problems. EXAM: CHEST - 2 VIEW COMPARISON:  08/27/2011 FINDINGS: Cardiac silhouette is normal in size. Normal mediastinal and hilar contours. Lungs are hyperexpanded. Minor linear scarring in the anterolateral left lung base. Lungs otherwise clear. No pleural effusion or pneumothorax. Skeletal structures are intact IMPRESSION: No active cardiopulmonary disease. Electronically Signed   By: Lajean Manes M.D.   On: 09/27/2019 06:45   Telemetry    09/28/19 NSR - Personally Reviewed  ECG    No new tracing as of 09/28/19- Personally Reviewed  Cardiac Studies    Echocardiogram: Pending  LHC: Pending  Patient Profile     69 y.o. male with pmh of CAD s/p stent placed on 2006 on 2 separate occasions, cath in 2007 with patent stents, nuclear stress test 2010 with no new ischemia and normal LVEF, HLD, HTN, diet controlled DM2, OSA not on CPAP, hypothyroidism and CVA in 08/2019 treated at [facility] who presents to the ER with chest pain.   Assessment & Plan    1. Unstable angina with know history of CAD with remote PCI: -Patient presented with progressive angina, similar to prior MI  -CAD history with remote stenting in 2006  -Continue ASA, Plavix >> recent CVA  -Echocardiogram>> pending -Plan for [facility] today for further coronary evaluation -Denies recurrent chest pain overnight  2. HTN: -Stable, 135/73, 132/77, 132/80 -Continue metoprolol 12.5 twice daily  3. HLD: -Last LDL, 152 -known history of statin intolerance -patient does not want to consider other agents at this time  4. DM2: -Hemoglobin A1c, 5.9 -Well-controlled  5. Recent CVA>> [facility] at [facility]: -MRI August 27, 2019 with small foci of restricted diffusion noted within the cortical gray and white matter in the region of the left peri-Rolandic cortex. No abnormal associated enhancement or susceptibility weighted signal, or T2 FLAIR signal.  -No residual deficits   -Continue Aspirin 325 mg daily and Plavix  6. Hypothyroidism: -Continue levothyroxine  7. OSA: -Not compliant with CPAP   Signed, Kathyrn Drown NP-C [facility] Pager: [phone] 09/28/2019, 7:46 AM     For questions or updates, please contact   Please consult [email or web address] for contact info under Cardiology/STEMI.  Pt seen and examined  Agree with findings as noted by   He is currently pain free  On exam, Lungs are CTA  Cardiac RRR  No S3   Abd supple Ext without edema  I have reviewed echo  LVEf and RVEF are normal   Mild MR   Plan for cath today  Dorris Carnes

## 2019-09-29 ENCOUNTER — Encounter (HOSPITAL_COMMUNITY): Payer: Self-pay | Admitting: Cardiovascular Disease

## 2019-09-29 ENCOUNTER — Inpatient Hospital Stay (HOSPITAL_COMMUNITY): Payer: Medicare Other

## 2019-09-29 DIAGNOSIS — Z01810 Encounter for preprocedural cardiovascular examination: Secondary | ICD-10-CM

## 2019-09-29 LAB — SURGICAL PCR SCREEN
MRSA, PCR: NEGATIVE
Staphylococcus aureus: NEGATIVE

## 2019-09-29 LAB — BASIC METABOLIC PANEL
Anion gap: 6 (ref 5–15)
BUN: 14 mg/dL (ref 8–23)
CO2: 27 mmol/L (ref 22–32)
Calcium: 8.5 mg/dL — ABNORMAL LOW (ref 8.9–10.3)
Chloride: 106 mmol/L (ref 98–111)
Creatinine, Ser: 1.07 mg/dL (ref 0.61–1.24)
GFR calc Af Amer: >60 mL/min (ref >60)
GFR calc non Af Amer: >60 mL/min (ref >60)
Glucose, Bld: 97 mg/dL (ref 70–99)
Potassium: 3.7 mmol/L (ref 3.5–5.1)
Sodium: 139 mmol/L (ref 135–145)

## 2019-09-29 LAB — TYPE AND SCREEN
ABO/RH(D): O POS
Antibody Screen: NEGATIVE

## 2019-09-29 LAB — ABO/RH: ABO/RH(D): O POS

## 2019-09-29 LAB — CBC
HCT: 40 % (ref 39.0–52.0)
Hemoglobin: 12.7 g/dL — ABNORMAL LOW (ref 13.0–17.0)
MCH: 29.3 pg (ref 26.0–34.0)
MCHC: 31.8 g/dL (ref 30.0–36.0)
MCV: 92.2 fL (ref 80.0–100.0)
Platelets: 212 10*3/uL (ref 150–400)
RBC: 4.34 MIL/uL (ref 4.22–5.81)
RDW: 14.1 % (ref 11.5–15.5)
WBC: 8.7 10*3/uL (ref 4.0–10.5)
nRBC: 0 % (ref 0.0–0.2)

## 2019-09-29 LAB — PLATELET INHIBITION P2Y12: Platelet Function  P2Y12: 150 [PRU] — ABNORMAL LOW (ref 182–335)

## 2019-09-29 MED ORDER — NOREPINEPHRINE 4 MG/250ML-% IV SOLN
0.0000 ug/min | INTRAVENOUS | Status: DC
  Filled 2019-09-29: qty 250

## 2019-09-29 MED ORDER — CHLORHEXIDINE GLUCONATE CLOTH 2 % EX PADS
6.0000 | MEDICATED_PAD | Freq: Once | CUTANEOUS | Status: AC
  Administered 2019-09-29: 6 via TOPICAL

## 2019-09-29 MED ORDER — CHLORHEXIDINE GLUCONATE CLOTH 2 % EX PADS
6.0000 | MEDICATED_PAD | Freq: Once | CUTANEOUS | Status: AC
  Administered 2019-09-30: 6 via TOPICAL

## 2019-09-29 MED ORDER — MAGNESIUM SULFATE 50 % IJ SOLN
40.0000 meq | INTRAMUSCULAR | Status: DC
  Filled 2019-09-29: qty 9.85

## 2019-09-29 MED ORDER — VANCOMYCIN HCL 1500 MG/300ML IV SOLN
1500.0000 mg | INTRAVENOUS | Status: AC
  Administered 2019-09-30: 1500 mg via INTRAVENOUS
  Filled 2019-09-29: qty 300

## 2019-09-29 MED ORDER — TRANEXAMIC ACID (OHS) BOLUS VIA INFUSION
15.0000 mg/kg | INTRAVENOUS | Status: AC
  Administered 2019-09-30: 1500 mg via INTRAVENOUS
  Filled 2019-09-29: qty 1500

## 2019-09-29 MED ORDER — SODIUM CHLORIDE 0.9 % IV SOLN
1.5000 g | INTRAVENOUS | Status: AC
  Administered 2019-09-30: 1.5 g via INTRAVENOUS
  Filled 2019-09-29 (×2): qty 1.5

## 2019-09-29 MED ORDER — SODIUM CHLORIDE 0.9 % IV SOLN
INTRAVENOUS | Status: DC
  Filled 2019-09-29 (×2): qty 30

## 2019-09-29 MED ORDER — DEXMEDETOMIDINE HCL IN NACL 400 MCG/100ML IV SOLN
0.1000 ug/kg/h | INTRAVENOUS | Status: AC
  Administered 2019-09-30: .7 ug/kg/h via INTRAVENOUS
  Filled 2019-09-29: qty 100

## 2019-09-29 MED ORDER — INSULIN REGULAR(HUMAN) IN NACL 100-0.9 UT/100ML-% IV SOLN
INTRAVENOUS | Status: AC
  Administered 2019-09-30: 1 [IU]/h via INTRAVENOUS
  Filled 2019-09-29: qty 100

## 2019-09-29 MED ORDER — BISACODYL 5 MG PO TBEC
5.0000 mg | DELAYED_RELEASE_TABLET | Freq: Once | ORAL | Status: AC
  Administered 2019-09-29: 5 mg via ORAL
  Filled 2019-09-29: qty 1

## 2019-09-29 MED ORDER — SODIUM CHLORIDE 0.9 % IV SOLN
750.0000 mg | INTRAVENOUS | Status: AC
  Administered 2019-09-30: 750 mg via INTRAVENOUS
  Filled 2019-09-29 (×2): qty 750

## 2019-09-29 MED ORDER — MILRINONE LACTATE IN DEXTROSE 20-5 MG/100ML-% IV SOLN
0.3000 ug/kg/min | INTRAVENOUS | Status: DC
  Filled 2019-09-29: qty 100

## 2019-09-29 MED ORDER — POTASSIUM CHLORIDE 2 MEQ/ML IV SOLN
80.0000 meq | INTRAVENOUS | Status: DC
  Filled 2019-09-29 (×2): qty 40

## 2019-09-29 MED ORDER — CHLORHEXIDINE GLUCONATE 0.12 % MT SOLN
15.0000 mL | Freq: Once | OROMUCOSAL | Status: AC
  Administered 2019-09-30: 15 mL via OROMUCOSAL
  Filled 2019-09-29: qty 15

## 2019-09-29 MED ORDER — NITROGLYCERIN IN D5W 200-5 MCG/ML-% IV SOLN
2.0000 ug/min | INTRAVENOUS | Status: AC
  Administered 2019-09-30: 5 ug/min via INTRAVENOUS
  Filled 2019-09-29: qty 250

## 2019-09-29 MED ORDER — METOPROLOL TARTRATE 12.5 MG HALF TABLET
12.5000 mg | ORAL_TABLET | Freq: Once | ORAL | Status: AC
  Administered 2019-09-30: 12.5 mg via ORAL
  Filled 2019-09-29: qty 1

## 2019-09-29 MED ORDER — TRANEXAMIC ACID 1000 MG/10ML IV SOLN
1.5000 mg/kg/h | INTRAVENOUS | Status: AC
  Administered 2019-09-30: 1.5 mg/kg/h via INTRAVENOUS
  Filled 2019-09-29 (×2): qty 25

## 2019-09-29 MED ORDER — PLASMA-LYTE 148 IV SOLN
INTRAVENOUS | Status: DC
  Filled 2019-09-29 (×2): qty 2.5

## 2019-09-29 MED ORDER — EPINEPHRINE HCL 5 MG/250ML IV SOLN IN NS
0.0000 ug/min | INTRAVENOUS | Status: DC
  Filled 2019-09-29: qty 250

## 2019-09-29 MED ORDER — PHENYLEPHRINE HCL-NACL 20-0.9 MG/250ML-% IV SOLN
30.0000 ug/min | INTRAVENOUS | Status: AC
  Administered 2019-09-30: 25 ug/min via INTRAVENOUS
  Filled 2019-09-29: qty 250

## 2019-09-29 MED ORDER — TRANEXAMIC ACID (OHS) PUMP PRIME SOLUTION
2.0000 mg/kg | INTRAVENOUS | Status: DC
  Filled 2019-09-29 (×2): qty 2

## 2019-09-29 MED ORDER — TEMAZEPAM 15 MG PO CAPS: 15.0000 mg | ORAL_CAPSULE | Freq: Once | ORAL | Status: DC | PRN

## 2019-09-29 NOTE — Anesthesia Preprocedure Evaluation (Addendum)
Anesthesia Evaluation  Patient identified by MRN, date of birth, ID band Patient awake    Reviewed: Allergy & Precautions, H&P , NPO status , Patient's Chart, lab work & pertinent test results  Airway Mallampati: II  TM Distance: >3 FB Neck ROM: Full    Dental  (+) Edentulous Upper, Edentulous Lower, Dental Advisory Given   Pulmonary asthma , sleep apnea , COPD, former smoker,    Pulmonary exam normal breath sounds clear to auscultation       Cardiovascular hypertension, Pt. on home beta blockers + angina + CAD, + Past MI and + Cardiac Stents  Normal cardiovascular exam Rhythm:Regular Rate:Normal  Echo 09/28/2019 1. Left ventricular ejection fraction by 3D volume is 56 %. The left ventricle has normal function. Left ventricular endocardial border not optimally defined to detect subtle regional wall motion abnormalities, however, wall motion appears grossly normal. There is moderate left ventricular hypertrophy. Left ventricular diastolic parameters were normal.  2. Right ventricular systolic function is normal. The right ventricular size is normal. Tricuspid regurgitation signal is inadequate for assessing PA pressure.  3. Left atrial size was mildly dilated.  4. The mitral valve is degenerative. Mild mitral valve regurgitation. No evidence of mitral stenosis.  5. The aortic valve is tricuspid. Aortic valve regurgitation is trivial. Mild aortic valve sclerosis is present, with no evidence of aortic valve stenosis.  6. There is borderline dilatation of the ascending aorta measuring 39 mm.    Neuro/Psych negative neurological ROS  negative psych ROS   GI/Hepatic Neg liver ROS, GERD  ,  Endo/Other  diabetes, Oral Hypoglycemic AgentsHypothyroidism   Renal/GU negative Renal ROS  negative genitourinary   Musculoskeletal  (+) Arthritis ,   Abdominal   Peds  Hematology negative hematology ROS (+)   Anesthesia Other  Findings See surgeon's H&P   Reproductive/Obstetrics negative OB ROS                            Anesthesia Physical  Anesthesia Plan  ASA: IV  Anesthesia Plan: General   Post-op Pain Management:    Induction: Intravenous  PONV Risk Score and Plan: 2 and Midazolam and Treatment may vary due to age or medical condition  Airway Management Planned: Oral ETT  Additional Equipment: Arterial line, CVP, PA Cath, TEE and Ultrasound Guidance Line Placement  Intra-op Plan:   Post-operative Plan: Post-operative intubation/ventilation  Informed Consent: I have reviewed the patients History and Physical, chart, labs and discussed the procedure including the risks, benefits and alternatives for the proposed anesthesia with the patient or authorized representative who has indicated his/her understanding and acceptance.     Dental advisory given  Plan Discussed with: CRNA  Anesthesia Plan Comments:        Anesthesia Quick Evaluation

## 2019-09-29 NOTE — Progress Notes (Addendum)
Progress Note  Patient Name: [name] Date of Encounter: 09/29/2019  Primary Cardiologist: New, remotely seen by Dr. Ron Parker  Subjective   Pt feeling well today. Anticipate TCTS to evaluate for possible surgical intervention today. No chest pain   Inpatient Medications    Scheduled Meds: . aspirin EC  81 mg Oral Daily  . heparin  5,000 Units Subcutaneous Q8H  . levothyroxine  150 mcg Oral Daily  . metoprolol tartrate  12.5 mg Oral BID  . sodium chloride flush  3 mL Intravenous Once  . sodium chloride flush  3 mL Intravenous Q12H  . sodium chloride flush  3 mL Intravenous Q12H  . sodium chloride flush  3 mL Intravenous Q12H   Continuous Infusions: . sodium chloride    . sodium chloride     PRN Meds: sodium chloride, sodium chloride, acetaminophen, ALPRAZolam, nitroGLYCERIN, ondansetron (ZOFRAN) IV, sodium chloride flush, sodium chloride flush, zolpidem   Vital Signs    Vitals:   09/28/19 2029 09/28/19 2204 09/28/19 2334 09/29/19 0611  BP: 138/77 135/85 130/77 130/80  Pulse: 80 68 66 68  Resp: 17  16 18   Temp: 98.3 F (36.8 C)  98.4 F (36.9 C) 97.6 F (36.4 C)  TempSrc: Oral  Oral Oral  SpO2: 97%  100% 96%  Weight:    100 kg  Height:    6\' 1"  (1.854 m)   No intake or output data in the 24 hours ending 09/29/19 0705 Filed Weights   09/27/19 0517 09/28/19 0423 09/29/19 0611  Weight: (!) 110 kg (!) 99.7 kg 100 kg    Physical Exam   General: Well developed, well nourished, NAD Neck: Negative for carotid bruits. No JVD Lungs:Clear to ausculation bilaterally. No wheezes, rales, or rhonchi. Breathing is unlabored. Cardiovascular: RRR with S1 S2. No murmurs Abdomen: Soft, non-tender, non-distended. No obvious abdominal masses. Extremities: No edema. Radial pulses 2+ bilaterally Neuro: Alert and oriented. No focal deficits. No facial asymmetry. MAE spontaneously. Psych: Responds to questions appropriately with normal affect.    Labs     Chemistry Recent Labs  Lab 09/27/19 0523 09/27/19 0523 09/27/19 1347 09/28/19 0348 09/29/19 0542  NA 139  --   --  140 139  K 3.8  --   --  3.7 3.7  CL 101  --   --  106 106  CO2 26  --   --  25 27  GLUCOSE 107*  --   --  86 97  BUN 14  --   --  14 14  CREATININE 1.15   < > 1.06 1.07 1.07  CALCIUM 9.6  --   --  8.9 8.5*  GFRNONAA >60   < > >60 >60 >60  GFRAA >60   < > >60 >60 >60  ANIONGAP 12  --   --  9 6   < > = values in this interval not displayed.     Hematology Recent Labs  Lab 09/27/19 1347 09/28/19 0348 09/29/19 0542  WBC 8.9 9.2 8.7  RBC 5.07 4.62 4.34  HGB 15.1 13.6 12.7*  HCT 46.9 42.4 40.0  MCV 92.5 91.8 92.2  MCH 29.8 29.4 29.3  MCHC 32.2 32.1 31.8  RDW 13.9 13.9 14.1  PLT 262 215 212    Cardiac EnzymesNo results for input(s): TROPONINI in the last 168 hours. No results for input(s): TROPIPOC in the last 168 hours.   BNPNo results for input(s): BNP, PROBNP in the last 168 hours.  DDimer No results for input(s): DDIMER in the last 168 hours.   Radiology    CARDIAC CATHETERIZATION  Result Date: 09/28/2019  Mid LAD lesion is 80% stenosed.  1st Diag lesion is 60% stenosed.  Ost LAD to Prox LAD lesion is 70% stenosed.  Prox Cx lesion is 70% stenosed.  Prox RCA lesion is 30% stenosed.  Mid RCA lesion is 80% stenosed.  Dist RCA lesion is 99% stenosed.  RPAV lesion is 90% stenosed.  RPDA lesion is 99% stenosed.  1. Severe triple vessel CAD 2. The LAD is a large vessel that courses to the apex. The proximal LAD has a severe stenosis leading into a large aneurysmal segment in the mid LAD. The aneurysmal segment arises at the takeoff of a moderate to large caliber Diagonal branch. The mid LAD stented segment has severe restenosis. The Diagonal branch is a moderate to large caliber bifurcating vessel with moderate proximal stenosis. 3. The Circumflex is a non-dominant moderate caliber vessel with severe proximal stenosis. 4. The RCA is a large dominant  vessel with a patent proximal stent with minimal restenosis. The mid and distal vessel has severe disease with two focal lesions. The PDA is a moderate caliber vessel with severe proximal stenosis. The posterolateral artery has severe disease. Recommendations: Will consult CT surgery for CABG. Plavix has been held. Will need Plavix washout prior to surgery. Continue ASA and statin.   ECHOCARDIOGRAM COMPLETE  Result Date: 09/28/2019    ECHOCARDIOGRAM REPORT   Patient Name:   [name] Date of Exam: 09/28/2019 Medical Rec #:  [id number]       Height:       73.0 in Accession #:    [id number]      Weight:       219.9 lb Date of Birth:  1950/05/10       BSA:          2.240 m Patient Age:    69 years        BP:           135/73 mmHg Patient Gender: M               HR:           80 bpm. Exam Location:  Inpatient Procedure: 2D Echo, Cardiac Doppler and Color Doppler Indications:    Chest pain  History:        Patient has no prior history of Echocardiogram examinations. CAD                 and Previous Myocardial Infarction; Risk Factors:Dyslipidemia,                 Hypertension, Sleep Apnea, Diabetes and Former Smoker.  Sonographer:    Clayton Lefort RDCS (AE) Referring Phys: [id number] CADENCE H FURTH  Sonographer Comments: Suboptimal subcostal window. IMPRESSIONS  1. Left ventricular ejection fraction by 3D volume is 56 %. The left ventricle has normal function. Left ventricular endocardial border not optimally defined to detect subtle regional wall motion abnormalities, however, wall motion appears grossly normal. There is moderate left ventricular hypertrophy. Left ventricular diastolic parameters were normal.  2. Right ventricular systolic function is normal. The right ventricular size is normal. Tricuspid regurgitation signal is inadequate for assessing PA pressure.  3. Left atrial size was mildly dilated.  4. The mitral valve is degenerative. Mild mitral valve regurgitation. No evidence of mitral stenosis.  5. The  aortic valve is tricuspid. Aortic valve regurgitation is trivial.  Mild aortic valve sclerosis is present, with no evidence of aortic valve stenosis.  6. There is borderline dilatation of the ascending aorta measuring 39 mm. FINDINGS  Left Ventricle: Left ventricular ejection fraction by 3D volume is 56 %. The left ventricle has normal function. Left ventricular endocardial border not optimally defined to evaluate regional wall motion. The left ventricular internal cavity size was normal in size. There is moderate left ventricular hypertrophy. Left ventricular diastolic parameters were normal. Right Ventricle: The right ventricular size is normal. No increase in right ventricular wall thickness. Right ventricular systolic function is normal. Tricuspid regurgitation signal is inadequate for assessing PA pressure. Left Atrium: Left atrial size was mildly dilated. Right Atrium: Right atrial size was normal in size. Pericardium: There is no evidence of pericardial effusion. Mitral Valve: The mitral valve is degenerative in appearance. Normal mobility of the mitral valve leaflets. Mild mitral valve regurgitation. No evidence of mitral valve stenosis. MV peak gradient, 2.7 mmHg. The mean mitral valve gradient is 1.0 mmHg. Tricuspid Valve: The tricuspid valve is normal in structure. Tricuspid valve regurgitation is trivial. No evidence of tricuspid stenosis. Aortic Valve: The aortic valve is tricuspid. Aortic valve regurgitation is trivial. Mild aortic valve sclerosis is present, with no evidence of aortic valve stenosis. There is mild calcification of the aortic valve. Aortic valve mean gradient measures 4.0 mmHg. Aortic valve peak gradient measures 6.1 mmHg. Aortic valve area, by VTI measures 2.90 cm. Pulmonic Valve: The pulmonic valve was normal in structure. Pulmonic valve regurgitation is not visualized. No evidence of pulmonic stenosis. Aorta: The aortic root is normal in size and structure. There is borderline  dilatation of the ascending aorta measuring 39 mm. Venous: The inferior vena cava was not well visualized. IAS/Shunts: The interatrial septum was not well visualized.  LEFT VENTRICLE PLAX 2D LVIDd:         4.40 cm         Diastology LVIDs:         3.50 cm         LV e' lateral:   9.46 cm/s LV PW:         1.30 cm         LV E/e' lateral: 7.7 LV IVS:        1.50 cm         LV e' medial:    8.59 cm/s LVOT diam:     2.40 cm         LV E/e' medial:  8.4 LV SV:         89 LV SV Index:   40 LVOT Area:     4.52 cm        3D Volume EF                                LV 3D EF:    Left                                             ventricular                                             ejection  fraction by                                             3D volume                                             is 56 %.                                 3D Volume EF:                                3D EF:        56 %                                LV EDV:       200 ml                                LV ESV:       88 ml                                LV SV:        111 ml RIGHT VENTRICLE RV Basal diam:  4.10 cm RV Mid diam:    3.00 cm RV S prime:     9.03 cm/s TAPSE (M-mode): 1.6 cm LEFT ATRIUM              Index       RIGHT ATRIUM           Index LA diam:        3.40 cm  1.52 cm/m  RA Area:     20.30 cm LA Vol (A2C):   104.0 ml 46.43 ml/m RA Volume:   52.30 ml  23.35 ml/m LA Vol (A4C):   57.4 ml  25.62 ml/m LA Biplane Vol: 83.3 ml  37.19 ml/m  AORTIC VALVE AV Area (Vmax):    3.29 cm AV Area (Vmean):   3.04 cm AV Area (VTI):     2.90 cm AV Vmax:           123.00 cm/s AV Vmean:          93.100 cm/s AV VTI:            0.307 m AV Peak Grad:      6.1 mmHg AV Mean Grad:      4.0 mmHg LVOT Vmax:         89.40 cm/s LVOT Vmean:        62.500 cm/s LVOT VTI:          0.197 m LVOT/AV VTI ratio: 0.64  AORTA Ao Root diam: 3.80 cm Ao Asc diam:  3.90 cm MITRAL VALVE MV Area (PHT): 3.91 cm    SHUNTS MV  Peak grad:  2.7 mmHg    Systemic VTI:  0.20 m MV Mean grad:  1.0 mmHg    Systemic Diam: 2.40 cm  MV Vmax:       0.82 m/s MV Vmean:      51.3 cm/s MV Decel Time: 194 msec MV E velocity: 72.40 cm/s MV A velocity: 58.70 cm/s MV E/A ratio:  1.23 Cherlynn Kaiser MD Electronically signed by Cherlynn Kaiser MD Signature Date/Time: 09/28/2019/2:41:05 PM    Final    Telemetry    09/29/19 NSR - Personally Reviewed  ECG    No new tracing as of 09/29/2019- Personally Reviewed  Cardiac Studies   LHC 09/28/19:   Mid LAD lesion is 80% stenosed.  1st Diag lesion is 60% stenosed.  Ost LAD to Prox LAD lesion is 70% stenosed.  Prox Cx lesion is 70% stenosed.  Prox RCA lesion is 30% stenosed.  Mid RCA lesion is 80% stenosed.  Dist RCA lesion is 99% stenosed.  RPAV lesion is 90% stenosed.  RPDA lesion is 99% stenosed.   1. Severe triple vessel CAD 2. The LAD is a large vessel that courses to the apex. The proximal LAD has a severe stenosis leading into a large aneurysmal segment in the mid LAD. The aneurysmal segment arises at the takeoff of a moderate to large caliber Diagonal branch. The mid LAD stented segment has severe restenosis. The Diagonal branch is a moderate to large caliber bifurcating vessel with moderate proximal stenosis.  3. The Circumflex is a non-dominant moderate caliber vessel with severe proximal stenosis.  4. The RCA is a large dominant vessel with a patent proximal stent with minimal restenosis. The mid and distal vessel has severe disease with two focal lesions. The PDA is a moderate caliber vessel with severe proximal stenosis. The posterolateral artery has severe disease.   Recommendations: Will consult CT surgery for CABG. Plavix has been held. Will need Plavix washout prior to surgery. Continue ASA and statin.   Diagnostic Dominance: Right    Echo 09/28/19:  1. Left ventricular ejection fraction by 3D volume is 56 %. The left  ventricle has normal function. Left  ventricular endocardial border not  optimally defined to detect subtle regional wall motion abnormalities,  however, wall motion appears grossly  normal. There is moderate left ventricular hypertrophy. Left ventricular  diastolic parameters were normal.  2. Right ventricular systolic function is normal. The right ventricular  size is normal. Tricuspid regurgitation signal is inadequate for assessing  PA pressure.  3. Left atrial size was mildly dilated.  4. The mitral valve is degenerative. Mild mitral valve regurgitation. No  evidence of mitral stenosis.  5. The aortic valve is tricuspid. Aortic valve regurgitation is trivial.  Mild aortic valve sclerosis is present, with no evidence of aortic valve  stenosis.  6. There is borderline dilatation of the ascending aorta measuring 39 mm.   Patient Profile     69 y.o. male with pmh of CAD s/p stent placed on 2006 on 2 separate occasions, cath in 2007 with patent stents, nuclear stress test 2010 with no new ischemia and normal LVEF, HLD, HTN,diet controlledDM2,OSA not on CPAP,hypothyroidism and CVA in 08/2019 treated at [facility] who presents to the ER with chest pain.  Assessment & Plan    1. Unstable anginawith know history of CAD with remote PCI: -Patient presented with progressive angina, similar to prior MI  -CAD history with remote stenting in 2006  -Continue ASA, Plavix >> recent CVA. Plavix being held for washout  -Echocardiogram 09/28/19 with stable LVEF  -LHC with 3VD>>>TCTS consulted  -Denies recurrent chest pain   2. HTN: -Stable, 141/76>130/80>130/77>135/85 -Continue metoprolol 12.5  twice daily  3. HLD: -Last LDL, 152 -known history of statin intolerance -patient does not want to consider other agents at this time  4. DM2: -Hemoglobin A1c, 5.9 -Well-controlled  5. Recent CVA>> followed at [facility]: -MRI August 27, 2019 with small foci of restricted diffusion noted within the cortical gray and white matter in the  region of the left peri-Rolandic cortex. No abnormal associated enhancement or susceptibility weighted signal, or T2 FLAIR signal.  -No residual deficits -Continue Aspirin 325 mg dailyand Plavix  6. Hypothyroidism: -Continue levothyroxine  7. OSA: -Not compliant with CPAP   Signed, Kathyrn Drown NP-C [facility] Pager: [phone] 09/29/2019, 7:05 AM    Patient seen and examined  I agree with findings as noted by Tonny Branch above  Pt comfortable   No CP  LUngs are CTA  Cardiac RRR  No S3 Ext are without edema  As noted above pt with severe 3 V dz  TCTS has been consulted  Await recommendations   Dorris Carnes MD  For questions or updates, please contact   Please consult [email or web address] for contact info under Cardiology/STEMI.

## 2019-09-29 NOTE — Progress Notes (Signed)
Pt sts that he has angina with exertion. He has been asx in room. Encouraged room mobility. Gave pt [facility] booklet and careguide but will f/u later after surgeon has seen him.  [place], ACSM 11:51 AM 09/29/2019

## 2019-09-29 NOTE — Progress Notes (Signed)
Pre cabg has been completed.   Preliminary results in CV Proc.   [name] 09/29/2019 10:39 AM

## 2019-09-29 NOTE — Progress Notes (Signed)
Patient eating breakfast this am. He states he has a "twinge of chest pain now and again", no shortness of breath. Dr. Orvan Seen to evaluate today. Ideally, would like to wait for Plavix washout but if has persistent chest pain may not be able to do so. Of note, P2Y12 150

## 2019-09-30 ENCOUNTER — Inpatient Hospital Stay (HOSPITAL_COMMUNITY)
Admission: EM | Disposition: A | Discharge status: Home / Self Care | Payer: Self-pay | Source: Home / Self Care | Attending: Cardiothoracic Surgery

## 2019-09-30 ENCOUNTER — Inpatient Hospital Stay (HOSPITAL_COMMUNITY): Payer: Medicare Other

## 2019-09-30 ENCOUNTER — Inpatient Hospital Stay (HOSPITAL_COMMUNITY): Payer: Medicare Other | Admitting: Anesthesiology

## 2019-09-30 DIAGNOSIS — Z951 Presence of aortocoronary bypass graft: Secondary | ICD-10-CM

## 2019-09-30 DIAGNOSIS — I25110 Atherosclerotic heart disease of native coronary artery with unstable angina pectoris: Secondary | ICD-10-CM

## 2019-09-30 HISTORY — PX: TEE WITHOUT CARDIOVERSION: SHX5443

## 2019-09-30 HISTORY — PX: ENDOVEIN HARVEST OF GREATER SAPHENOUS VEIN: SHX5059

## 2019-09-30 HISTORY — PX: RADIAL ARTERY HARVEST: SHX5067

## 2019-09-30 HISTORY — PX: CORONARY ARTERY BYPASS GRAFT: SHX141

## 2019-09-30 LAB — POCT I-STAT, CHEM 8
BUN: 18 mg/dL (ref 8–23)
BUN: 18 mg/dL (ref 8–23)
BUN: 16 mg/dL (ref 8–23)
BUN: 16 mg/dL (ref 8–23)
BUN: 17 mg/dL (ref 8–23)
BUN: 17 mg/dL (ref 8–23)
Calcium, Ion: 1.23 mmol/L (ref 1.15–1.40)
Calcium, Ion: 1.25 mmol/L (ref 1.15–1.40)
Calcium, Ion: 1.09 mmol/L — ABNORMAL LOW (ref 1.15–1.40)
Calcium, Ion: 1.12 mmol/L — ABNORMAL LOW (ref 1.15–1.40)
Calcium, Ion: 1.12 mmol/L — ABNORMAL LOW (ref 1.15–1.40)
Calcium, Ion: 1.13 mmol/L — ABNORMAL LOW (ref 1.15–1.40)
Chloride: 102 mmol/L (ref 98–111)
Chloride: 101 mmol/L (ref 98–111)
Chloride: 101 mmol/L (ref 98–111)
Chloride: 103 mmol/L (ref 98–111)
Chloride: 102 mmol/L (ref 98–111)
Chloride: 102 mmol/L (ref 98–111)
Creatinine, Ser: 0.8 mg/dL (ref 0.61–1.24)
Creatinine, Ser: 0.8 mg/dL (ref 0.61–1.24)
Creatinine, Ser: 0.8 mg/dL (ref 0.61–1.24)
Creatinine, Ser: 0.8 mg/dL (ref 0.61–1.24)
Creatinine, Ser: 0.8 mg/dL (ref 0.61–1.24)
Creatinine, Ser: 0.8 mg/dL (ref 0.61–1.24)
Glucose, Bld: 105 mg/dL — ABNORMAL HIGH (ref 70–99)
Glucose, Bld: 120 mg/dL — ABNORMAL HIGH (ref 70–99)
Glucose, Bld: 121 mg/dL — ABNORMAL HIGH (ref 70–99)
Glucose, Bld: 134 mg/dL — ABNORMAL HIGH (ref 70–99)
Glucose, Bld: 136 mg/dL — ABNORMAL HIGH (ref 70–99)
Glucose, Bld: 131 mg/dL — ABNORMAL HIGH (ref 70–99)
HCT: 37 % — ABNORMAL LOW (ref 39.0–52.0)
HCT: 36 % — ABNORMAL LOW (ref 39.0–52.0)
HCT: 29 % — ABNORMAL LOW (ref 39.0–52.0)
HCT: 29 % — ABNORMAL LOW (ref 39.0–52.0)
HCT: 30 % — ABNORMAL LOW (ref 39.0–52.0)
HCT: 30 % — ABNORMAL LOW (ref 39.0–52.0)
Hemoglobin: 12.6 g/dL — ABNORMAL LOW (ref 13.0–17.0)
Hemoglobin: 12.2 g/dL — ABNORMAL LOW (ref 13.0–17.0)
Hemoglobin: 9.9 g/dL — ABNORMAL LOW (ref 13.0–17.0)
Hemoglobin: 9.9 g/dL — ABNORMAL LOW (ref 13.0–17.0)
Hemoglobin: 10.2 g/dL — ABNORMAL LOW (ref 13.0–17.0)
Hemoglobin: 10.2 g/dL — ABNORMAL LOW (ref 13.0–17.0)
Potassium: 3.7 mmol/L (ref 3.5–5.1)
Potassium: 4.1 mmol/L (ref 3.5–5.1)
Potassium: 4.4 mmol/L (ref 3.5–5.1)
Potassium: 4.5 mmol/L (ref 3.5–5.1)
Potassium: 4.4 mmol/L (ref 3.5–5.1)
Potassium: 4 mmol/L (ref 3.5–5.1)
Sodium: 141 mmol/L (ref 135–145)
Sodium: 141 mmol/L (ref 135–145)
Sodium: 138 mmol/L (ref 135–145)
Sodium: 139 mmol/L (ref 135–145)
Sodium: 139 mmol/L (ref 135–145)
Sodium: 140 mmol/L (ref 135–145)
TCO2: 27 mmol/L (ref 22–32)
TCO2: 28 mmol/L (ref 22–32)
TCO2: 26 mmol/L (ref 22–32)
TCO2: 26 mmol/L (ref 22–32)
TCO2: 27 mmol/L (ref 22–32)
TCO2: 23 mmol/L (ref 22–32)

## 2019-09-30 LAB — BASIC METABOLIC PANEL
Anion gap: 11 (ref 5–15)
Anion gap: 10 (ref 5–15)
BUN: 15 mg/dL (ref 8–23)
BUN: 17 mg/dL (ref 8–23)
CO2: 25 mmol/L (ref 22–32)
CO2: 19 mmol/L — ABNORMAL LOW (ref 22–32)
Calcium: 8.9 mg/dL (ref 8.9–10.3)
Calcium: 7.3 mg/dL — ABNORMAL LOW (ref 8.9–10.3)
Chloride: 104 mmol/L (ref 98–111)
Chloride: 106 mmol/L (ref 98–111)
Creatinine, Ser: 1.12 mg/dL (ref 0.61–1.24)
Creatinine, Ser: 1.04 mg/dL (ref 0.61–1.24)
GFR calc Af Amer: >60 mL/min (ref >60)
GFR calc Af Amer: >60 mL/min (ref >60)
GFR calc non Af Amer: >60 mL/min (ref >60)
GFR calc non Af Amer: >60 mL/min (ref >60)
Glucose, Bld: 105 mg/dL — ABNORMAL HIGH (ref 70–99)
Glucose, Bld: 133 mg/dL — ABNORMAL HIGH (ref 70–99)
Potassium: 3.5 mmol/L (ref 3.5–5.1)
Potassium: 4.1 mmol/L (ref 3.5–5.1)
Sodium: 140 mmol/L (ref 135–145)
Sodium: 135 mmol/L (ref 135–145)

## 2019-09-30 LAB — POCT I-STAT 7, (LYTES, BLD GAS, ICA,H+H)
Acid-Base Excess: 1 mmol/L (ref 0.0–2.0)
Acid-Base Excess: 1 mmol/L (ref 0.0–2.0)
Acid-base deficit: 4 mmol/L — ABNORMAL HIGH (ref 0.0–2.0)
Acid-base deficit: 4 mmol/L — ABNORMAL HIGH (ref 0.0–2.0)
Acid-base deficit: 5 mmol/L — ABNORMAL HIGH (ref 0.0–2.0)
Bicarbonate: 27.6 mmol/L (ref 20.0–28.0)
Bicarbonate: 24.1 mmol/L (ref 20.0–28.0)
Bicarbonate: 22.6 mmol/L (ref 20.0–28.0)
Bicarbonate: 21.9 mmol/L (ref 20.0–28.0)
Bicarbonate: 22.4 mmol/L (ref 20.0–28.0)
Calcium, Ion: 1.02 mmol/L — ABNORMAL LOW (ref 1.15–1.40)
Calcium, Ion: 1.06 mmol/L — ABNORMAL LOW (ref 1.15–1.40)
Calcium, Ion: 1.12 mmol/L — ABNORMAL LOW (ref 1.15–1.40)
Calcium, Ion: 1.1 mmol/L — ABNORMAL LOW (ref 1.15–1.40)
Calcium, Ion: 1.12 mmol/L — ABNORMAL LOW (ref 1.15–1.40)
HCT: 31 % — ABNORMAL LOW (ref 39.0–52.0)
HCT: 30 % — ABNORMAL LOW (ref 39.0–52.0)
HCT: 35 % — ABNORMAL LOW (ref 39.0–52.0)
HCT: 33 % — ABNORMAL LOW (ref 39.0–52.0)
HCT: 34 % — ABNORMAL LOW (ref 39.0–52.0)
Hemoglobin: 10.5 g/dL — ABNORMAL LOW (ref 13.0–17.0)
Hemoglobin: 10.2 g/dL — ABNORMAL LOW (ref 13.0–17.0)
Hemoglobin: 11.9 g/dL — ABNORMAL LOW (ref 13.0–17.0)
Hemoglobin: 11.2 g/dL — ABNORMAL LOW (ref 13.0–17.0)
Hemoglobin: 11.6 g/dL — ABNORMAL LOW (ref 13.0–17.0)
O2 Saturation: 100 %
O2 Saturation: 100 %
O2 Saturation: 95 %
O2 Saturation: 97 %
O2 Saturation: 93 %
Patient temperature: 37.7
Patient temperature: 37.6
Patient temperature: 37.7
Potassium: 4.1 mmol/L (ref 3.5–5.1)
Potassium: 4.3 mmol/L (ref 3.5–5.1)
Potassium: 4.3 mmol/L (ref 3.5–5.1)
Potassium: 4.3 mmol/L (ref 3.5–5.1)
Potassium: 4.3 mmol/L (ref 3.5–5.1)
Sodium: 141 mmol/L (ref 135–145)
Sodium: 138 mmol/L (ref 135–145)
Sodium: 141 mmol/L (ref 135–145)
Sodium: 138 mmol/L (ref 135–145)
Sodium: 140 mmol/L (ref 135–145)
TCO2: 29 mmol/L (ref 22–32)
TCO2: 25 mmol/L (ref 22–32)
TCO2: 24 mmol/L (ref 22–32)
TCO2: 23 mmol/L (ref 22–32)
TCO2: 24 mmol/L (ref 22–32)
pCO2 arterial: 53.3 mmHg — ABNORMAL HIGH (ref 32.0–48.0)
pCO2 arterial: 33 mmHg (ref 32.0–48.0)
pCO2 arterial: 49.7 mmHg — ABNORMAL HIGH (ref 32.0–48.0)
pCO2 arterial: 41.3 mmHg (ref 32.0–48.0)
pCO2 arterial: 50.2 mmHg — ABNORMAL HIGH (ref 32.0–48.0)
pH, Arterial: 7.322 — ABNORMAL LOW (ref 7.350–7.450)
pH, Arterial: 7.472 — ABNORMAL HIGH (ref 7.350–7.450)
pH, Arterial: 7.27 — ABNORMAL LOW (ref 7.350–7.450)
pH, Arterial: 7.335 — ABNORMAL LOW (ref 7.350–7.450)
pH, Arterial: 7.262 — ABNORMAL LOW (ref 7.350–7.450)
pO2, Arterial: 277 mmHg — ABNORMAL HIGH (ref 83.0–108.0)
pO2, Arterial: 342 mmHg — ABNORMAL HIGH (ref 83.0–108.0)
pO2, Arterial: 89 mmHg (ref 83.0–108.0)
pO2, Arterial: 95 mmHg (ref 83.0–108.0)
pO2, Arterial: 81 mmHg — ABNORMAL LOW (ref 83.0–108.0)

## 2019-09-30 LAB — CBC
HCT: 41.2 % (ref 39.0–52.0)
HCT: 36.1 % — ABNORMAL LOW (ref 39.0–52.0)
HCT: 35 % — ABNORMAL LOW (ref 39.0–52.0)
Hemoglobin: 13.4 g/dL (ref 13.0–17.0)
Hemoglobin: 11.4 g/dL — ABNORMAL LOW (ref 13.0–17.0)
Hemoglobin: 11 g/dL — ABNORMAL LOW (ref 13.0–17.0)
MCH: 29.8 pg (ref 26.0–34.0)
MCH: 29.4 pg (ref 26.0–34.0)
MCH: 29.4 pg (ref 26.0–34.0)
MCHC: 32.5 g/dL (ref 30.0–36.0)
MCHC: 31.6 g/dL (ref 30.0–36.0)
MCHC: 31.4 g/dL (ref 30.0–36.0)
MCV: 91.6 fL (ref 80.0–100.0)
MCV: 93 fL (ref 80.0–100.0)
MCV: 93.6 fL (ref 80.0–100.0)
Platelets: 236 10*3/uL (ref 150–400)
Platelets: 148 10*3/uL — ABNORMAL LOW (ref 150–400)
Platelets: 155 10*3/uL (ref 150–400)
RBC: 4.5 MIL/uL (ref 4.22–5.81)
RBC: 3.88 MIL/uL — ABNORMAL LOW (ref 4.22–5.81)
RBC: 3.74 MIL/uL — ABNORMAL LOW (ref 4.22–5.81)
RDW: 13.9 % (ref 11.5–15.5)
RDW: 14.1 % (ref 11.5–15.5)
RDW: 14.1 % (ref 11.5–15.5)
WBC: 11.4 10*3/uL — ABNORMAL HIGH (ref 4.0–10.5)
WBC: 20.2 10*3/uL — ABNORMAL HIGH (ref 4.0–10.5)
WBC: 19.2 10*3/uL — ABNORMAL HIGH (ref 4.0–10.5)
nRBC: 0 % (ref 0.0–0.2)
nRBC: 0 % (ref 0.0–0.2)
nRBC: 0 % (ref 0.0–0.2)

## 2019-09-30 LAB — GLUCOSE, CAPILLARY
Glucose-Capillary: 101 mg/dL — ABNORMAL HIGH (ref 70–99)
Glucose-Capillary: 102 mg/dL — ABNORMAL HIGH (ref 70–99)
Glucose-Capillary: 103 mg/dL — ABNORMAL HIGH (ref 70–99)
Glucose-Capillary: 112 mg/dL — ABNORMAL HIGH (ref 70–99)
Glucose-Capillary: 114 mg/dL — ABNORMAL HIGH (ref 70–99)
Glucose-Capillary: 123 mg/dL — ABNORMAL HIGH (ref 70–99)
Glucose-Capillary: 136 mg/dL — ABNORMAL HIGH (ref 70–99)
Glucose-Capillary: 137 mg/dL — ABNORMAL HIGH (ref 70–99)

## 2019-09-30 LAB — BLOOD GAS, ARTERIAL
Acid-Base Excess: 0.7 mmol/L (ref 0.0–2.0)
Bicarbonate: 24.6 mmol/L (ref 20.0–28.0)
Drawn by: 345601
FIO2: 21
O2 Saturation: 94.7 %
Patient temperature: 37.6
pCO2 arterial: 39.5 mmHg (ref 32.0–48.0)
pH, Arterial: 7.414 (ref 7.350–7.450)
pO2, Arterial: 75.9 mmHg — ABNORMAL LOW (ref 83.0–108.0)

## 2019-09-30 LAB — URINALYSIS, ROUTINE W REFLEX MICROSCOPIC
Bilirubin Urine: NEGATIVE
Glucose, UA: NEGATIVE mg/dL
Hgb urine dipstick: NEGATIVE
Ketones, ur: NEGATIVE mg/dL
Leukocytes,Ua: NEGATIVE
Nitrite: NEGATIVE
Protein, ur: NEGATIVE mg/dL
Specific Gravity, Urine: 1.017 (ref 1.005–1.030)
pH: 6 (ref 5.0–8.0)

## 2019-09-30 LAB — APTT: aPTT: 35 seconds (ref 24–36)

## 2019-09-30 LAB — PROTIME-INR
INR: 1.3 — ABNORMAL HIGH (ref 0.8–1.2)
Prothrombin Time: 15.8 seconds — ABNORMAL HIGH (ref 11.4–15.2)

## 2019-09-30 LAB — HEMOGLOBIN AND HEMATOCRIT, BLOOD
HCT: 30.1 % — ABNORMAL LOW (ref 39.0–52.0)
Hemoglobin: 9.7 g/dL — ABNORMAL LOW (ref 13.0–17.0)

## 2019-09-30 LAB — MAGNESIUM: Magnesium: 2.8 mg/dL — ABNORMAL HIGH (ref 1.7–2.4)

## 2019-09-30 LAB — PLATELET COUNT: Platelets: 163 10*3/uL (ref 150–400)

## 2019-09-30 SURGERY — CORONARY ARTERY BYPASS GRAFTING (CABG)
Anesthesia: General | Site: Chest | Laterality: Right

## 2019-09-30 MED ORDER — MAGNESIUM SULFATE 4 GM/100ML IV SOLN
INTRAVENOUS | Status: AC
  Administered 2019-09-30: 4 g via INTRAVENOUS
  Filled 2019-09-30: qty 100

## 2019-09-30 MED ORDER — MIDAZOLAM HCL (PF) 10 MG/2ML IJ SOLN
INTRAMUSCULAR | Status: AC
  Filled 2019-09-30: qty 2

## 2019-09-30 MED ORDER — LACTATED RINGERS IV SOLN: INTRAVENOUS | Status: DC | PRN

## 2019-09-30 MED ORDER — CHLORHEXIDINE GLUCONATE 0.12 % MT SOLN
15.0000 mL | OROMUCOSAL | Status: AC
  Administered 2019-09-30: 15 mL via OROMUCOSAL
  Filled 2019-09-30: qty 15

## 2019-09-30 MED ORDER — MAGNESIUM SULFATE 4 GM/100ML IV SOLN: 4.0000 g | Freq: Once | INTRAVENOUS | Status: AC

## 2019-09-30 MED ORDER — STERILE WATER FOR INJECTION IV SOLN
INTRAVENOUS | Status: DC | PRN
  Administered 2019-09-30: 30 mL

## 2019-09-30 MED ORDER — LACTATED RINGERS IV SOLN: INTRAVENOUS | Status: DC

## 2019-09-30 MED ORDER — BUPIVACAINE LIPOSOME 1.3 % IJ SUSP
20.0000 mL | Freq: Once | INTRAMUSCULAR | Status: AC
  Administered 2019-09-30: 20 mL
  Filled 2019-09-30: qty 20

## 2019-09-30 MED ORDER — HEMOSTATIC AGENTS (NO CHARGE) OPTIME
TOPICAL | Status: DC | PRN
  Administered 2019-09-30: 1 via TOPICAL

## 2019-09-30 MED ORDER — LACTATED RINGERS IV SOLN: 500.0000 mL | Freq: Once | INTRAVENOUS | Status: DC | PRN

## 2019-09-30 MED ORDER — FAMOTIDINE IN NACL 20-0.9 MG/50ML-% IV SOLN: 20.0000 mg | Freq: Two times a day (BID) | INTRAVENOUS | Status: AC

## 2019-09-30 MED ORDER — INSULIN REGULAR(HUMAN) IN NACL 100-0.9 UT/100ML-% IV SOLN: INTRAVENOUS | Status: DC

## 2019-09-30 MED ORDER — OXYCODONE HCL 5 MG PO TABS
5.0000 mg | ORAL_TABLET | ORAL | Status: DC | PRN
  Administered 2019-09-30 – 2019-10-03 (×4): 10 mg via ORAL
  Filled 2019-09-30 (×4): qty 2

## 2019-09-30 MED ORDER — ACETAMINOPHEN 160 MG/5ML PO SOLN: 1000.0000 mg | Freq: Four times a day (QID) | ORAL | Status: DC

## 2019-09-30 MED ORDER — BISACODYL 10 MG RE SUPP: 10.0000 mg | Freq: Every day | RECTAL | Status: DC

## 2019-09-30 MED ORDER — BUPIVACAINE HCL (PF) 0.5 % IJ SOLN
INTRAMUSCULAR | Status: AC
  Filled 2019-09-30: qty 30

## 2019-09-30 MED ORDER — NITROGLYCERIN IN D5W 200-5 MCG/ML-% IV SOLN: 0.0000 ug/min | INTRAVENOUS | Status: DC

## 2019-09-30 MED ORDER — PROPOFOL 10 MG/ML IV BOLUS
INTRAVENOUS | Status: DC | PRN
  Administered 2019-09-30: 50 mg via INTRAVENOUS

## 2019-09-30 MED ORDER — FAMOTIDINE IN NACL 20-0.9 MG/50ML-% IV SOLN
INTRAVENOUS | Status: AC
  Administered 2019-09-30: 20 mg via INTRAVENOUS
  Filled 2019-09-30: qty 50

## 2019-09-30 MED ORDER — MORPHINE SULFATE (PF) 2 MG/ML IV SOLN
1.0000 mg | INTRAVENOUS | Status: DC | PRN
  Administered 2019-09-30 (×2): 4 mg via INTRAVENOUS
  Administered 2019-09-30: 2 mg via INTRAVENOUS
  Administered 2019-10-01: 4 mg via INTRAVENOUS
  Administered 2019-10-02: 2 mg via INTRAVENOUS
  Filled 2019-09-30 (×4): qty 1
  Filled 2019-09-30 (×2): qty 2

## 2019-09-30 MED ORDER — METOPROLOL TARTRATE 25 MG/10 ML ORAL SUSPENSION
12.5000 mg | Freq: Two times a day (BID) | ORAL | Status: DC
  Filled 2019-09-30 (×6): qty 5

## 2019-09-30 MED ORDER — SODIUM CHLORIDE 0.9% FLUSH
3.0000 mL | Freq: Two times a day (BID) | INTRAVENOUS | Status: DC
  Administered 2019-10-01 – 2019-10-02 (×3): 3 mL via INTRAVENOUS

## 2019-09-30 MED ORDER — BISACODYL 5 MG PO TBEC
10.0000 mg | DELAYED_RELEASE_TABLET | Freq: Every day | ORAL | Status: DC
  Administered 2019-10-01 – 2019-10-04 (×4): 10 mg via ORAL
  Filled 2019-09-30 (×4): qty 2

## 2019-09-30 MED ORDER — SODIUM CHLORIDE 0.9 % IV SOLN: 250.0000 mL | INTRAVENOUS | Status: DC

## 2019-09-30 MED ORDER — FENTANYL CITRATE (PF) 250 MCG/5ML IJ SOLN
INTRAMUSCULAR | Status: DC | PRN
  Administered 2019-09-30 (×2): 150 ug via INTRAVENOUS
  Administered 2019-09-30: 50 ug via INTRAVENOUS
  Administered 2019-09-30: 100 ug via INTRAVENOUS
  Administered 2019-09-30: 50 ug via INTRAVENOUS
  Administered 2019-09-30 (×3): 150 ug via INTRAVENOUS
  Administered 2019-09-30: 300 ug via INTRAVENOUS

## 2019-09-30 MED ORDER — PANTOPRAZOLE SODIUM 40 MG PO TBEC
40.0000 mg | DELAYED_RELEASE_TABLET | Freq: Every day | ORAL | Status: DC
  Administered 2019-10-02 – 2019-10-05 (×4): 40 mg via ORAL
  Filled 2019-09-30 (×4): qty 1

## 2019-09-30 MED ORDER — VANCOMYCIN HCL IN DEXTROSE 1-5 GM/200ML-% IV SOLN
1000.0000 mg | Freq: Once | INTRAVENOUS | Status: AC
  Administered 2019-09-30: 1000 mg via INTRAVENOUS
  Filled 2019-09-30: qty 200

## 2019-09-30 MED ORDER — ONDANSETRON HCL 4 MG/2ML IJ SOLN: 4.0000 mg | Freq: Four times a day (QID) | INTRAMUSCULAR | Status: DC | PRN

## 2019-09-30 MED ORDER — ASPIRIN EC 325 MG PO TBEC: 325.0000 mg | DELAYED_RELEASE_TABLET | Freq: Every day | ORAL | Status: DC

## 2019-09-30 MED ORDER — PROTAMINE SULFATE 10 MG/ML IV SOLN
INTRAVENOUS | Status: DC | PRN
  Administered 2019-09-30: 30 mg via INTRAVENOUS

## 2019-09-30 MED ORDER — STERILE WATER FOR INJECTION IJ SOLN
INTRAMUSCULAR | Status: DC | PRN
  Administered 2019-09-30: 1000 mL
  Administered 2019-09-30: 10 mL

## 2019-09-30 MED ORDER — METOPROLOL TARTRATE 12.5 MG HALF TABLET
12.5000 mg | ORAL_TABLET | Freq: Two times a day (BID) | ORAL | Status: DC
  Administered 2019-10-01 – 2019-10-04 (×8): 12.5 mg via ORAL
  Filled 2019-09-30 (×8): qty 1

## 2019-09-30 MED ORDER — PLASMA-LYTE 148 IV SOLN
INTRAVENOUS | Status: DC | PRN
  Administered 2019-09-30: 500 mL via INTRAVASCULAR

## 2019-09-30 MED ORDER — EPHEDRINE SULFATE-NACL 50-0.9 MG/10ML-% IV SOSY
PREFILLED_SYRINGE | INTRAVENOUS | Status: DC | PRN
  Administered 2019-09-30 (×2): 5 mg via INTRAVENOUS

## 2019-09-30 MED ORDER — SODIUM CHLORIDE 0.9 % IV SOLN
1.5000 g | Freq: Two times a day (BID) | INTRAVENOUS | Status: AC
  Administered 2019-09-30 – 2019-10-02 (×4): 1.5 g via INTRAVENOUS
  Filled 2019-09-30 (×4): qty 1.5

## 2019-09-30 MED ORDER — ALBUMIN HUMAN 5 % IV SOLN
250.0000 mL | INTRAVENOUS | Status: AC | PRN
  Administered 2019-09-30 (×3): 12.5 g via INTRAVENOUS
  Filled 2019-09-30: qty 250

## 2019-09-30 MED ORDER — ROCURONIUM BROMIDE 10 MG/ML (PF) SYRINGE
PREFILLED_SYRINGE | INTRAVENOUS | Status: DC | PRN
  Administered 2019-09-30 (×3): 50 mg via INTRAVENOUS
  Administered 2019-09-30: 100 mg via INTRAVENOUS

## 2019-09-30 MED ORDER — SODIUM CHLORIDE 0.9 % IV SOLN: INTRAVENOUS | Status: DC

## 2019-09-30 MED ORDER — METOPROLOL TARTRATE 5 MG/5ML IV SOLN: 2.5000 mg | INTRAVENOUS | Status: DC | PRN

## 2019-09-30 MED ORDER — PROPOFOL 10 MG/ML IV BOLUS
INTRAVENOUS | Status: AC
  Filled 2019-09-30: qty 20

## 2019-09-30 MED ORDER — SODIUM CHLORIDE 0.45 % IV SOLN: INTRAVENOUS | Status: DC | PRN

## 2019-09-30 MED ORDER — HEPARIN SODIUM (PORCINE) 1000 UNIT/ML IJ SOLN
INTRAMUSCULAR | Status: DC | PRN
  Administered 2019-09-30: 3000 [IU] via INTRAVENOUS
  Administered 2019-09-30: 29000 [IU] via INTRAVENOUS

## 2019-09-30 MED ORDER — PHENYLEPHRINE HCL (PRESSORS) 10 MG/ML IV SOLN
INTRAVENOUS | Status: DC | PRN
  Administered 2019-09-30 (×2): 80 ug via INTRAVENOUS

## 2019-09-30 MED ORDER — PHENYLEPHRINE 40 MCG/ML (10ML) SYRINGE FOR IV PUSH (FOR BLOOD PRESSURE SUPPORT)
PREFILLED_SYRINGE | INTRAVENOUS | Status: DC | PRN
  Administered 2019-09-30 (×2): 120 ug via INTRAVENOUS
  Administered 2019-09-30: 160 ug via INTRAVENOUS

## 2019-09-30 MED ORDER — ACETAMINOPHEN 160 MG/5ML PO SOLN: 650.0000 mg | Freq: Once | ORAL | Status: AC

## 2019-09-30 MED ORDER — MIDAZOLAM HCL 2 MG/2ML IJ SOLN: 2.0000 mg | INTRAMUSCULAR | Status: DC | PRN

## 2019-09-30 MED ORDER — CHLORHEXIDINE GLUCONATE CLOTH 2 % EX PADS
6.0000 | MEDICATED_PAD | Freq: Every day | CUTANEOUS | Status: DC
  Administered 2019-09-30 – 2019-10-02 (×2): 6 via TOPICAL

## 2019-09-30 MED ORDER — ASPIRIN 81 MG PO CHEW: 324.0000 mg | CHEWABLE_TABLET | Freq: Every day | ORAL | Status: DC

## 2019-09-30 MED ORDER — SODIUM CHLORIDE 0.9% FLUSH: 3.0000 mL | INTRAVENOUS | Status: DC | PRN

## 2019-09-30 MED ORDER — MIDAZOLAM HCL 5 MG/5ML IJ SOLN
INTRAMUSCULAR | Status: DC | PRN
  Administered 2019-09-30: 4 mg via INTRAVENOUS
  Administered 2019-09-30 (×2): 2 mg via INTRAVENOUS
  Administered 2019-09-30 (×2): 1 mg via INTRAVENOUS

## 2019-09-30 MED ORDER — FENTANYL CITRATE (PF) 250 MCG/5ML IJ SOLN
INTRAMUSCULAR | Status: AC
  Filled 2019-09-30: qty 25

## 2019-09-30 MED ORDER — ACETAMINOPHEN 500 MG PO TABS
1000.0000 mg | ORAL_TABLET | Freq: Four times a day (QID) | ORAL | Status: DC
  Administered 2019-09-30 – 2019-10-05 (×17): 1000 mg via ORAL
  Filled 2019-09-30 (×19): qty 2

## 2019-09-30 MED ORDER — BUPIVACAINE HCL (PF) 0.5 % IJ SOLN
INTRAMUSCULAR | Status: DC | PRN
  Administered 2019-09-30: 30 mL

## 2019-09-30 MED ORDER — PHENYLEPHRINE HCL-NACL 20-0.9 MG/250ML-% IV SOLN
0.0000 ug/min | INTRAVENOUS | Status: DC
  Filled 2019-09-30: qty 250

## 2019-09-30 MED ORDER — DOCUSATE SODIUM 100 MG PO CAPS
200.0000 mg | ORAL_CAPSULE | Freq: Every day | ORAL | Status: DC
  Administered 2019-10-01 – 2019-10-04 (×4): 200 mg via ORAL
  Filled 2019-09-30 (×5): qty 2

## 2019-09-30 MED ORDER — DEXMEDETOMIDINE HCL IN NACL 400 MCG/100ML IV SOLN: 0.0000 ug/kg/h | INTRAVENOUS | Status: DC

## 2019-09-30 MED ORDER — NITROGLYCERIN 0.2 MG/ML ON CALL CATH LAB
INTRAVENOUS | Status: DC | PRN
  Administered 2019-09-30: 80 ug via INTRAVENOUS
  Administered 2019-09-30: 40 ug via INTRAVENOUS

## 2019-09-30 MED ORDER — TRAMADOL HCL 50 MG PO TABS
50.0000 mg | ORAL_TABLET | ORAL | Status: DC | PRN
  Administered 2019-10-01 (×4): 100 mg via ORAL
  Filled 2019-09-30 (×4): qty 2

## 2019-09-30 MED ORDER — STERILE WATER FOR INJECTION IJ SOLN
INTRAMUSCULAR | Status: AC
  Filled 2019-09-30: qty 10

## 2019-09-30 MED ORDER — VANCOMYCIN HCL 1000 MG IV SOLR
INTRAVENOUS | Status: AC
  Filled 2019-09-30: qty 3000

## 2019-09-30 MED ORDER — DEXTROSE 50 % IV SOLN: 0.0000 mL | INTRAVENOUS | Status: DC | PRN

## 2019-09-30 MED ORDER — ALBUMIN HUMAN 5 % IV SOLN: INTRAVENOUS | Status: DC | PRN

## 2019-09-30 MED ORDER — 0.9 % SODIUM CHLORIDE (POUR BTL) OPTIME
TOPICAL | Status: DC | PRN
  Administered 2019-09-30: 6000 mL

## 2019-09-30 MED ORDER — ACETAMINOPHEN 650 MG RE SUPP
650.0000 mg | Freq: Once | RECTAL | Status: AC
  Administered 2019-09-30: 650 mg via RECTAL

## 2019-09-30 MED ORDER — POTASSIUM CHLORIDE 10 MEQ/50ML IV SOLN: 10.0000 meq | INTRAVENOUS | Status: AC

## 2019-09-30 SURGICAL SUPPLY — 135 items
ADAPTER CARDIO PERF ANTE/RETRO (ADAPTER) ×5 IMPLANT
ADH SKN CLS APL DERMABOND .7 (GAUZE/BANDAGES/DRESSINGS) ×4
ADH SKN CLS LQ APL DERMABOND (GAUZE/BANDAGES/DRESSINGS) ×4
ADPR PRFSN 84XANTGRD RTRGD (ADAPTER) ×4
APL SRG 7X2 LUM MLBL SLNT (VASCULAR PRODUCTS) ×4
APPLICATOR TIP COSEAL (VASCULAR PRODUCTS) ×1 IMPLANT
APPLIER CLIP 9.375 SM OPEN (CLIP) ×5
APR CLP SM 9.3 20 MLT OPN (CLIP) ×4
BAG DECANTER FOR FLEXI CONT (MISCELLANEOUS) ×5 IMPLANT
BLADE CLIPPER SURG (BLADE) ×5 IMPLANT
BLADE STERNUM SYSTEM 6 (BLADE) ×5 IMPLANT
BLADE SURG 15 STRL LF DISP TIS (BLADE) ×4 IMPLANT
BLADE SURG 15 STRL SS (BLADE) ×5
BNDG CMPR MED 10X6 ELC LF (GAUZE/BANDAGES/DRESSINGS) ×4
BNDG ELASTIC 4X5.8 VLCR STR LF (GAUZE/BANDAGES/DRESSINGS) ×7 IMPLANT
BNDG ELASTIC 6X10 VLCR STRL LF (GAUZE/BANDAGES/DRESSINGS) ×1 IMPLANT
BNDG ELASTIC 6X5.8 VLCR STR LF (GAUZE/BANDAGES/DRESSINGS) ×4 IMPLANT
BNDG GAUZE ELAST 4 BULKY (GAUZE/BANDAGES/DRESSINGS) ×6 IMPLANT
CANISTER SUCT 3000ML PPV (MISCELLANEOUS) ×5 IMPLANT
CANNULA NON VENT 22FR 12 (CANNULA) ×1 IMPLANT
CATH CPB KIT HENDRICKSON (MISCELLANEOUS) ×5 IMPLANT
CATH ROBINSON RED A/P 18FR (CATHETERS) ×10 IMPLANT
CLIP APPLIE 9.375 SM OPEN (CLIP) ×4 IMPLANT
CLIP FOGARTY SPRING 6M (CLIP) ×1 IMPLANT
CLIP RETRACTION 3.0MM CORONARY (MISCELLANEOUS) ×6 IMPLANT
CLIP VESOCCLUDE MED 24/CT (CLIP) IMPLANT
CLIP VESOCCLUDE SM WIDE 24/CT (CLIP) ×1 IMPLANT
CONN ST 1/4X3/8  BEN (MISCELLANEOUS) ×10
CONN ST 1/4X3/8 BEN (MISCELLANEOUS) IMPLANT
COVER MAYO STAND STRL (DRAPES) ×5 IMPLANT
CUFF TOURN SGL QUICK 18X4 (TOURNIQUET CUFF) IMPLANT
CUFF TOURN SGL QUICK 24 (TOURNIQUET CUFF)
CUFF TRNQT CYL 24X4X16.5-23 (TOURNIQUET CUFF) IMPLANT
DERMABOND ADHESIVE PROPEN (GAUZE/BANDAGES/DRESSINGS) ×1
DERMABOND ADVANCED (GAUZE/BANDAGES/DRESSINGS) ×1
DERMABOND ADVANCED .7 DNX12 (GAUZE/BANDAGES/DRESSINGS) ×4 IMPLANT
DERMABOND ADVANCED .7 DNX6 (GAUZE/BANDAGES/DRESSINGS) IMPLANT
DRAIN CHANNEL 28F RND 3/8 FF (WOUND CARE) ×15 IMPLANT
DRAPE CARDIOVASCULAR INCISE (DRAPES) ×5
DRAPE EXTREMITY T 121X128X90 (DISPOSABLE) ×5 IMPLANT
DRAPE HALF SHEET 40X57 (DRAPES) ×5 IMPLANT
DRAPE SLUSH/WARMER DISC (DRAPES) ×5 IMPLANT
DRAPE SRG 135X102X78XABS (DRAPES) ×4 IMPLANT
DRSG AQUACEL AG ADV 3.5X14 (GAUZE/BANDAGES/DRESSINGS) ×5 IMPLANT
ELECT BLADE 4.0 EZ CLEAN MEGAD (MISCELLANEOUS) ×5
ELECT CAUTERY BLADE 6.4 (BLADE) ×5 IMPLANT
ELECT REM PT RETURN 9FT ADLT (ELECTROSURGICAL) ×10
ELECTRODE BLDE 4.0 EZ CLN MEGD (MISCELLANEOUS) IMPLANT
ELECTRODE REM PT RTRN 9FT ADLT (ELECTROSURGICAL) ×8 IMPLANT
FELT TEFLON 1X6 (MISCELLANEOUS) ×10 IMPLANT
GAUZE SPONGE 4X4 12PLY STRL (GAUZE/BANDAGES/DRESSINGS) ×9 IMPLANT
GAUZE SPONGE 4X4 12PLY STRL LF (GAUZE/BANDAGES/DRESSINGS) ×1 IMPLANT
GEL ULTRASOUND 20GR AQUASONIC (MISCELLANEOUS) ×5 IMPLANT
GLOVE BIO SURGEON STRL SZ 6 (GLOVE) ×2 IMPLANT
GLOVE BIO SURGEON STRL SZ 6.5 (GLOVE) ×3 IMPLANT
GLOVE BIO SURGEON STRL SZ7.5 (GLOVE) ×2 IMPLANT
GLOVE BIOGEL PI IND STRL 6.5 (GLOVE) IMPLANT
GLOVE BIOGEL PI IND STRL 7.5 (GLOVE) IMPLANT
GLOVE BIOGEL PI IND STRL 8.5 (GLOVE) IMPLANT
GLOVE BIOGEL PI INDICATOR 6.5 (GLOVE) ×4
GLOVE BIOGEL PI INDICATOR 7.5 (GLOVE) ×3
GLOVE BIOGEL PI INDICATOR 8.5 (GLOVE) ×1
GLOVE INDICATOR 7.5 STRL GRN (GLOVE) ×3 IMPLANT
GLOVE NEODERM STRL 7.5  LF PF (GLOVE) ×12
GLOVE NEODERM STRL 7.5 LF PF (GLOVE) ×12 IMPLANT
GLOVE SURG NEODERM 7.5  LF PF (GLOVE) ×3
GOWN STRL REUS W/ TWL LRG LVL3 (GOWN DISPOSABLE) ×16 IMPLANT
GOWN STRL REUS W/ TWL XL LVL3 (GOWN DISPOSABLE) IMPLANT
GOWN STRL REUS W/TWL LRG LVL3 (GOWN DISPOSABLE) ×25
GOWN STRL REUS W/TWL XL LVL3 (GOWN DISPOSABLE) ×15
HEMOSTAT POWDER SURGIFOAM 1G (HEMOSTASIS) ×10 IMPLANT
IV CATH 22GX1 FEP (IV SOLUTION) ×3 IMPLANT
KIT BASIN OR (CUSTOM PROCEDURE TRAY) ×5 IMPLANT
KIT SUCTION CATH 14FR (SUCTIONS) ×5 IMPLANT
KIT TURNOVER KIT B (KITS) ×5 IMPLANT
KIT VASOVIEW HEMOPRO 2 VH 4000 (KITS) ×5 IMPLANT
MARKER GRAFT CORONARY BYPASS (MISCELLANEOUS) ×15 IMPLANT
NDL 18GX1X1/2 (RX/OR ONLY) (NEEDLE) ×4 IMPLANT
NEEDLE 18GX1X1/2 (RX/OR ONLY) (NEEDLE) ×5 IMPLANT
NS IRRIG 1000ML POUR BTL (IV SOLUTION) ×25 IMPLANT
PACK E OPEN HEART (SUTURE) ×5 IMPLANT
PACK OPEN HEART (CUSTOM PROCEDURE TRAY) ×5 IMPLANT
PACK SPY-PHI (KITS) IMPLANT
PAD ARMBOARD 7.5X6 YLW CONV (MISCELLANEOUS) ×10 IMPLANT
PAD ELECT DEFIB RADIOL ZOLL (MISCELLANEOUS) ×5 IMPLANT
PENCIL BUTTON HOLSTER BLD 10FT (ELECTRODE) ×5 IMPLANT
POSITIONER HEAD DONUT 9IN (MISCELLANEOUS) ×5 IMPLANT
POWDER SURGICEL 3.0 GRAM (HEMOSTASIS) IMPLANT
PUNCH AORTIC ROTATE 4.5MM 8IN (MISCELLANEOUS) ×1 IMPLANT
SEALANT SURG COSEAL 8ML (VASCULAR PRODUCTS) ×1 IMPLANT
SET CARDIOPLEGIA MPS 5001102 (MISCELLANEOUS) ×1 IMPLANT
SHEARS HARMONIC 9CM CVD (BLADE) IMPLANT
SPONGE LAP 18X18 RF (DISPOSABLE) ×1 IMPLANT
STAPLER VISISTAT 35W (STAPLE) ×1 IMPLANT
SUPPORT HEART JANKE-BARRON (MISCELLANEOUS) ×5 IMPLANT
SUT BONE WAX W31G (SUTURE) ×5 IMPLANT
SUT ETHIBOND X763 2 0 SH 1 (SUTURE) ×1 IMPLANT
SUT MNCRL AB 3-0 PS2 18 (SUTURE) ×10 IMPLANT
SUT MNCRL AB 4-0 PS2 18 (SUTURE) ×1 IMPLANT
SUT PDS AB 1 CTX 36 (SUTURE) ×11 IMPLANT
SUT PROLENE 3 0 SH DA (SUTURE) ×6 IMPLANT
SUT PROLENE 5 0 C 1 36 (SUTURE) ×1 IMPLANT
SUT PROLENE 6 0 C 1 30 (SUTURE) ×17 IMPLANT
SUT PROLENE 7 0 BV 1 (SUTURE) ×1 IMPLANT
SUT PROLENE 7 0 BV1 MDA (SUTURE) ×1 IMPLANT
SUT PROLENE 8 0 BV175 6 (SUTURE) IMPLANT
SUT PROLENE BLUE 7 0 (SUTURE) ×5 IMPLANT
SUT SILK  1 MH (SUTURE) ×5
SUT SILK 1 MH (SUTURE) IMPLANT
SUT SILK 2 0 SH CR/8 (SUTURE) IMPLANT
SUT SILK 3 0 SH CR/8 (SUTURE) IMPLANT
SUT SILK 4 0 TIE 10X30 (SUTURE) ×2 IMPLANT
SUT STEEL 6MS V (SUTURE) ×5 IMPLANT
SUT STEEL SZ 6 DBL 3X14 BALL (SUTURE) ×5 IMPLANT
SUT TEM PAC WIRE 2 0 SH (SUTURE) ×2 IMPLANT
SUT VIC AB 2-0 CT1 27 (SUTURE) ×5
SUT VIC AB 2-0 CT1 TAPERPNT 27 (SUTURE) IMPLANT
SUT VIC AB 2-0 CTX 27 (SUTURE) IMPLANT
SUT VIC AB 3-0 SH 27 (SUTURE)
SUT VIC AB 3-0 SH 27X BRD (SUTURE) IMPLANT
SUT VIC AB 3-0 X1 27 (SUTURE) IMPLANT
SYR 10ML LL (SYRINGE) IMPLANT
SYR 30ML LL (SYRINGE) ×5 IMPLANT
SYR 30ML SLIP (SYRINGE) ×1 IMPLANT
SYR 3ML LL SCALE MARK (SYRINGE) ×5 IMPLANT
SYR 50ML SLIP (SYRINGE) IMPLANT
SYSTEM SAHARA CHEST DRAIN ATS (WOUND CARE) ×5 IMPLANT
TAPE CLOTH 4X10 WHT NS (GAUZE/BANDAGES/DRESSINGS) ×1 IMPLANT
TOWEL GREEN STERILE (TOWEL DISPOSABLE) ×5 IMPLANT
TOWEL GREEN STERILE FF (TOWEL DISPOSABLE) ×5 IMPLANT
TRAY FOLEY SLVR 16FR TEMP STAT (SET/KITS/TRAYS/PACK) ×5 IMPLANT
TUBING LAP HI FLOW INSUFFLATIO (TUBING) ×5 IMPLANT
UNDERPAD 30X36 HEAVY ABSORB (UNDERPADS AND DIAPERS) ×5 IMPLANT
WATER STERILE IRR 1000ML POUR (IV SOLUTION) ×10 IMPLANT
WATER STERILE IRR 1000ML UROMA (IV SOLUTION) IMPLANT

## 2019-09-30 NOTE — OR Nursing (Signed)
EXPAREL Exparel given per hospital policy and protocol with last dose provided at 1330.

## 2019-09-30 NOTE — Discharge Summary (Signed)
Physician Discharge Summary       [address]       [place] [address]             [phone]    Patient ID: [name] MRN: [id number] DOB/AGE: 68-Nov-1952 68 y.o.  Admit date: 09/27/2019 Discharge date: 10/05/2019  Admission Diagnoses: 1. Unstable angina ([place]) 2. Coronary artery disease  Discharge Diagnoses:  1. S/p CABG x 5 2. Expected post op blood loss anemia 3. History of COPD (chronic obstructive pulmonary disease) ([employer]) 4. History of Dyslipidemia 5. History of Hypertension 6. History of OSA on CPAP 7. History of Type II diabetes mellitus ([place]) 8. History of Hypothyroidism 9. History of Arthritis 10. History of GERD (gastroesophageal reflux disease) 11. History of Overweight 12. History of tobacco abuse  Consults: None  Procedure (s):  CORONARY ARTERY BYPASS GRAFTING (CABG) x 5  (LIMA TO LAD, RIMA TO OM1, RADIAL ARTERY SEQUENCED TO PDA AND OTHER, SVG TO DIAG 1) by Dr. Orvan Seen on 09/30/2019.  History of Presenting Illness: We are asked to see this patient in cardiothoracic surgical consultation.  The patient is a 69 year old male with a previous history of coronary artery disease status post stent placement in 2006 on 2 separate occasions.  Cardiac catheterization done in 2007 revealed patent stents at that time.  A nuclear stress test done in 2010 showed no evidence of ischemia and normal LVEF.  Patient presented to the emergency department yesterday with chief complaint of chest pain.  He describes approximately 6 months of progressive angina symptoms.  Over the last week the episodes have been increasing and he was developing chest discomfort with only minimal exertion.  On the day prior to admission he had an episode he described as 6/10 chest pain while walking from his car to the grocery store.  He also had associated shortness of breath.  On the morning of presentation he had a similar episode which was also associated with significant weakness  and diaphoresis.  The chest discomfort did radiate into his throat.  He described the pain as a tightness and very similar to his chest discomfort prior to previous stent placement.  The symptoms would resolve after approximately 5 to 10 minutes with rest.  He denied any other recent illness.  He does have multiple cardiac comorbidities including hyperlipidemia, hypertension, type 2 diabetes mellitus and obstructive sleep apnea although he reports he does not use his CPAP.  He also has had a CVA recently for which she was treated at [facility].  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  He had right-sided weakness but symptoms resolved very quickly with full resolution.  He was started on the chance protocol and was also on a daily aspirin.  He is also wore a Zio patch but final results are still pending from this study.  He was also on Plavix.  The patient was seen and admitted by cardiology for further evaluation and treatment including cardiac catheterization which was performed on today's date.  He is found to have severe three-vessel coronary artery disease and we are asked to consult for consideration of coronary artery surgical revascularization.  Please see the full catheterization report echocardiogram was also performed on today's date.  It shows left ventricular ejection fraction by 3D volume estimated at 56%.  P2Y12 on 08/03 was 150. Dr. Orvan Seen discussed the need for coronary artery bypass grafting surgery. Potential risks, benefits, and complications of the surgery were discussed with  patient and he agreed to proceed with surgery. He underwent a CABG x 5 on 09/30/2019.  Brief Hospital Course:  The patient was extubated the evening of surgery without difficulty. He/she remained afebrile and hemodynamically stable. He was weaned off Nitro and Neo Synephrine, and drips. Gordy Councilman, a line,  and foley were removed early in the post operative course. Chest tubes remained until 08/07  then were removed. Lopressor was started and titrated accordingly. He was volume over loaded and diuresed. He had ABL anemia. He did not require a post op transfusion. Last H and H was 9.2 and 27.7. He was weaned off the insulin drip.   The patient's glucose remained well controlled.The patient's HGA1C pre op was 5.9. The patient was felt surgically stable for transfer from the ICU to [facility]U for further convalescence on 08/08 . He continues to progress with cardiac rehab. He was ambulating on room air. He has been tolerating a diet and has had a bowel movement. Epicardial pacing wires were removed on 08/08 . Patient was started on Atorvastatin 10 mg every evening on 08/09. The patient is felt surgically stable for discharge today.   Latest Vital Signs: Blood pressure 119/72, pulse 96, temperature 97.9 F (36.6 C), temperature source Oral, resp. rate 15, height 6\' 1"  (1.854 m), weight 102 kg, SpO2 95 %.  Physical Exam: Cardiovascular: RRR Pulmonary: Slightly diminished bibasilar breath sounds Abdomen: Soft, non tender, bowel sounds present. Extremities: Mild bilateral lower extremity edema. Wounds: Clean and dry.  No erythema or signs of infection.   Discharge Condition:Discharged to home.  Recent laboratory studies:  Lab Results  Component Value Date   WBC 14.1 (H) 10/03/2019   HGB 9.2 (L) 10/03/2019   HCT 27.7 (L) 10/03/2019   MCV 92.0 10/03/2019   PLT 174 10/03/2019   Lab Results  Component Value Date   NA 135 10/03/2019   K 3.8 10/03/2019   CL 100 10/03/2019   CO2 24 10/03/2019   CREATININE 1.04 10/03/2019   GLUCOSE 120 (H) 10/03/2019      Diagnostic Studies: DG Chest 2 View  Result Date: 09/27/2019 CLINICAL DATA:  Pt reports that he has had SOB for 18 months and has progressively worsened for last 30 days. Pt reports low stamina and low exertion. Pt has had 2 stents 15 years ago after heart attack. Pt reports stroke 1 month ago. Pt reports pain in neck and pressure in chest.  Pt states that he feels he has a blockage in his heart and has an appointment with cardiologist on Monday. Pt denies any other heart or lung problems. EXAM: CHEST - 2 VIEW COMPARISON:  08/27/2011 FINDINGS: Cardiac silhouette is normal in size. Normal mediastinal and hilar contours. Lungs are hyperexpanded. Minor linear scarring in the anterolateral left lung base. Lungs otherwise clear. No pleural effusion or pneumothorax. Skeletal structures are intact IMPRESSION: No active cardiopulmonary disease. Electronically Signed   By: Lajean Manes M.D.   On: 09/27/2019 06:45   CARDIAC CATHETERIZATION  Result Date: 09/28/2019  Mid LAD lesion is 80% stenosed.  1st Diag lesion is 60% stenosed.  Ost LAD to Prox LAD lesion is 70% stenosed.  Prox Cx lesion is 70% stenosed.  Prox RCA lesion is 30% stenosed.  Mid RCA lesion is 80% stenosed.  Dist RCA lesion is 99% stenosed.  RPAV lesion is 90% stenosed.  RPDA lesion is 99% stenosed.  1. Severe triple vessel CAD 2. The LAD is a large vessel that courses to the apex. The  proximal LAD has a severe stenosis leading into a large aneurysmal segment in the mid LAD. The aneurysmal segment arises at the takeoff of a moderate to large caliber Diagonal branch. The mid LAD stented segment has severe restenosis. The Diagonal branch is a moderate to large caliber bifurcating vessel with moderate proximal stenosis. 3. The Circumflex is a non-dominant moderate caliber vessel with severe proximal stenosis. 4. The RCA is a large dominant vessel with a patent proximal stent with minimal restenosis. The mid and distal vessel has severe disease with two focal lesions. The PDA is a moderate caliber vessel with severe proximal stenosis. The posterolateral artery has severe disease. Recommendations: Will consult CT surgery for CABG. Plavix has been held. Will need Plavix washout prior to surgery. Continue ASA and statin.   DG CHEST PORT 1 VIEW  Result Date: 10/03/2019 CLINICAL DATA:  Low  lung volumes, concern for pneumothorax EXAM: PORTABLE CHEST 1 VIEW COMPARISON:  10/01/2019 chest radiograph. FINDINGS: Interval removal of bilateral chest tubes, Swan-Ganz catheter and mediastinal drain. Intact sternotomy wires. Stable cardiomediastinal silhouette with top-normal heart size. New tiny right apical pneumothorax, less than 5%. No left pneumothorax. Trace bilateral pleural effusions. No overt pulmonary edema. Bibasilar atelectasis is similar to mildly improved. IMPRESSION: 1. New tiny right apical pneumothorax, less than 5%. Interval removal of bilateral chest tubes. 2. Trace bilateral pleural effusions. 3. Bibasilar atelectasis, similar to mildly improved. Electronically Signed   By: Ilona Sorrel M.D.   On: 10/03/2019 10:02   DG Chest Port 1 View  Result Date: 10/01/2019 CLINICAL DATA:  Open-heart surgery.  Sore chest. EXAM: PORTABLE CHEST 1 VIEW COMPARISON:  09/30/2019. FINDINGS: Interim extubation and removal of NG tube. Swan-Ganz catheter, mediastinal drainage catheter, bilateral chest tubes in stable position. Prior CABG. Heart size stable. No pulmonary venous congestion. Low lung volumes with persistent bibasilar atelectasis again noted. No pleural effusion. IMPRESSION: 1. Interim extubation removal of NG tube. Swan-Ganz catheter, mediastinal drainage catheter, bilateral chest tubes in stable position. No pneumothorax. 2.  Low lung volumes with persistent bibasilar atelectasis. 3.  Prior CABG.  Heart size stable.  No pulmonary venous congestion. Electronically Signed   By: Marcello Moores  Register   On: 10/01/2019 06:46   DG Chest Port 1 View  Result Date: 09/30/2019 CLINICAL DATA:  69 year old male status post CABG postoperative day zero. EXAM: PORTABLE CHEST 1 VIEW COMPARISON:  Chest radiographs 09/27/2019 and earlier. FINDINGS: Portable AP semi upright view at 1541 hours. Endotracheal tube tip in good position at the level the clavicles. Enteric tube courses to the left abdomen, tip not included.  Right IJ approach Swan-Ganz catheter, tip at the main pulmonary outflow level. Mediastinal and bilateral chest tubes in place. Mediastinal contours remain within normal limits. No pneumothorax. No pulmonary edema. Perihilar and bibasilar atelectasis. Small pleural effusions are possible. IMPRESSION: 1. Lines and tubes appear appropriately placed. 2. No pneumothorax or pulmonary edema. Atelectasis, possibly with small pleural effusions. Electronically Signed   By: Genevie Ann M.D.   On: 09/30/2019 15:50   ECHOCARDIOGRAM COMPLETE  Result Date: 09/28/2019    ECHOCARDIOGRAM REPORT   Patient Name:   [name] Date of Exam: 09/28/2019 Medical Rec #:  [id number]       Height:       73.0 in Accession #:    [id number]      Weight:       219.9 lb Date of Birth:  05-22-1950       BSA:  2.240 m Patient Age:    46 years        BP:           135/73 mmHg Patient Gender: M               HR:           80 bpm. Exam Location:  Inpatient Procedure: 2D Echo, Cardiac Doppler and Color Doppler Indications:    Chest pain  History:        Patient has no prior history of Echocardiogram examinations. CAD                 and Previous Myocardial Infarction; Risk Factors:Dyslipidemia,                 Hypertension, Sleep Apnea, Diabetes and Former Smoker.  Sonographer:    Clayton Lefort RDCS (AE) Referring Phys: [id number] CADENCE H FURTH  Sonographer Comments: Suboptimal subcostal window. IMPRESSIONS  1. Left ventricular ejection fraction by 3D volume is 56 %. The left ventricle has normal function. Left ventricular endocardial border not optimally defined to detect subtle regional wall motion abnormalities, however, wall motion appears grossly normal. There is moderate left ventricular hypertrophy. Left ventricular diastolic parameters were normal.  2. Right ventricular systolic function is normal. The right ventricular size is normal. Tricuspid regurgitation signal is inadequate for assessing PA pressure.  3. Left atrial size was mildly  dilated.  4. The mitral valve is degenerative. Mild mitral valve regurgitation. No evidence of mitral stenosis.  5. The aortic valve is tricuspid. Aortic valve regurgitation is trivial. Mild aortic valve sclerosis is present, with no evidence of aortic valve stenosis.  6. There is borderline dilatation of the ascending aorta measuring 39 mm. FINDINGS  Left Ventricle: Left ventricular ejection fraction by 3D volume is 56 %. The left ventricle has normal function. Left ventricular endocardial border not optimally defined to evaluate regional wall motion. The left ventricular internal cavity size was normal in size. There is moderate left ventricular hypertrophy. Left ventricular diastolic parameters were normal. Right Ventricle: The right ventricular size is normal. No increase in right ventricular wall thickness. Right ventricular systolic function is normal. Tricuspid regurgitation signal is inadequate for assessing PA pressure. Left Atrium: Left atrial size was mildly dilated. Right Atrium: Right atrial size was normal in size. Pericardium: There is no evidence of pericardial effusion. Mitral Valve: The mitral valve is degenerative in appearance. Normal mobility of the mitral valve leaflets. Mild mitral valve regurgitation. No evidence of mitral valve stenosis. MV peak gradient, 2.7 mmHg. The mean mitral valve gradient is 1.0 mmHg. Tricuspid Valve: The tricuspid valve is normal in structure. Tricuspid valve regurgitation is trivial. No evidence of tricuspid stenosis. Aortic Valve: The aortic valve is tricuspid. Aortic valve regurgitation is trivial. Mild aortic valve sclerosis is present, with no evidence of aortic valve stenosis. There is mild calcification of the aortic valve. Aortic valve mean gradient measures 4.0 mmHg. Aortic valve peak gradient measures 6.1 mmHg. Aortic valve area, by VTI measures 2.90 cm. Pulmonic Valve: The pulmonic valve was normal in structure. Pulmonic valve regurgitation is not  visualized. No evidence of pulmonic stenosis. Aorta: The aortic root is normal in size and structure. There is borderline dilatation of the ascending aorta measuring 39 mm. Venous: The inferior vena cava was not well visualized. IAS/Shunts: The interatrial septum was not well visualized.  LEFT VENTRICLE PLAX 2D LVIDd:         4.40 cm  Diastology LVIDs:         3.50 cm         LV e' lateral:   9.46 cm/s LV PW:         1.30 cm         LV E/e' lateral: 7.7 LV IVS:        1.50 cm         LV e' medial:    8.59 cm/s LVOT diam:     2.40 cm         LV E/e' medial:  8.4 LV SV:         89 LV SV Index:   40 LVOT Area:     4.52 cm        3D Volume EF                                LV 3D EF:    Left                                             ventricular                                             ejection                                             fraction by                                             3D volume                                             is 56 %.                                 3D Volume EF:                                3D EF:        56 %                                LV EDV:       200 ml                                LV ESV:       88 ml                                LV SV:  111 ml RIGHT VENTRICLE RV Basal diam:  4.10 cm RV Mid diam:    3.00 cm RV S prime:     9.03 cm/s TAPSE (M-mode): 1.6 cm LEFT ATRIUM              Index       RIGHT ATRIUM           Index LA diam:        3.40 cm  1.52 cm/m  RA Area:     20.30 cm LA Vol (A2C):   104.0 ml 46.43 ml/m RA Volume:   52.30 ml  23.35 ml/m LA Vol (A4C):   57.4 ml  25.62 ml/m LA Biplane Vol: 83.3 ml  37.19 ml/m  AORTIC VALVE AV Area (Vmax):    3.29 cm AV Area (Vmean):   3.04 cm AV Area (VTI):     2.90 cm AV Vmax:           123.00 cm/s AV Vmean:          93.100 cm/s AV VTI:            0.307 m AV Peak Grad:      6.1 mmHg AV Mean Grad:      4.0 mmHg LVOT Vmax:         89.40 cm/s LVOT Vmean:        62.500 cm/s LVOT VTI:          0.197 m  LVOT/AV VTI ratio: 0.64  AORTA Ao Root diam: 3.80 cm Ao Asc diam:  3.90 cm MITRAL VALVE MV Area (PHT): 3.91 cm    SHUNTS MV Peak grad:  2.7 mmHg    Systemic VTI:  0.20 m MV Mean grad:  1.0 mmHg    Systemic Diam: 2.40 cm MV Vmax:       0.82 m/s MV Vmean:      51.3 cm/s MV Decel Time: 194 msec MV E velocity: 72.40 cm/s MV A velocity: 58.70 cm/s MV E/A ratio:  1.23 Cherlynn Kaiser MD Electronically signed by Cherlynn Kaiser MD Signature Date/Time: 09/28/2019/2:41:05 PM    Final    ECHO INTRAOPERATIVE TEE  Result Date: 10/01/2019  *INTRAOPERATIVE TRANSESOPHAGEAL REPORT *  Patient Name:   [name] Date of Exam: 09/30/2019 Medical Rec #:  [id number]       Height:       73.0 in Accession #:    [id number]      Weight:       218.6 lb Date of Birth:  [birth year]       BSA:          2.23 m Patient Age:    55 years        BP:           128/76 mmHg Patient Gender: M               HR:           53 bpm. Exam Location:  Inpatient Transesophogeal exam was perform intraoperatively during surgical procedure. Patient was closely monitored under general anesthesia during the entirety of examination. Indications:     coronary artery disease Performing Phys: [id number] Z ATKINS Diagnosing Phys: Nolon Nations MD Complications: No known complications during this procedure. POST-OP IMPRESSIONS Overall, there were no significant changes from pre-bypass. - Left Ventricle: The left ventricle is unchanged from pre-bypass. - Right Ventricle: The right ventricle appears unchanged from pre-bypass. - Aorta: The aorta appears unchanged from pre-bypass. - Left Atrium:  The left atrium appears unchanged from pre-bypass. - Left Atrial Appendage: The left atrial appendage appears unchanged from pre-bypass. - Aortic Valve: The aortic valve appears unchanged from pre-bypass. - Mitral Valve: The mitral valve appears unchanged from pre-bypass. - Tricuspid Valve: The tricuspid valve appears unchanged from pre-bypass. - Interatrial Septum: The  interatrial septum appears unchanged from pre-bypass. - Interventricular Septum: The interventricular septum appears unchanged from pre-bypass. - Pericardium: The pericardium appears unchanged from pre-bypass. PRE-OP FINDINGS  Left Ventricle: The left ventricle has normal systolic function, with an ejection fraction of 55-60%. The cavity size was normal. There is mildly increased left ventricular wall thickness. Right Ventricle: The right ventricle has normal systolic function. The cavity was normal. There is no increase in right ventricular wall thickness. Left Atrium: Left atrial size was normal in size. Right Atrium: Right atrial size was grossly enlarged in size. Interatrial Septum: No atrial level shunt detected by color flow Doppler. Pericardium: There is no evidence of pericardial effusion. Mitral Valve: The mitral valve is normal in structure. Mild thickening of the mitral valve leaflet. Mitral valve regurgitation is mild by color flow Doppler. There is no evidence of mitral valve vegetation. Tricuspid Valve: The tricuspid valve was normal in structure. Tricuspid valve regurgitation is trivial by color flow Doppler. There is no evidence of tricuspid valve vegetation. Aortic Valve: The aortic valve is tricuspid Aortic valve regurgitation was not visualized by color flow Doppler. There is no evidence of aortic valve stenosis. There is no evidence of a vegetation on the aortic valve. Pulmonic Valve: The pulmonic valve was normal in structure. Pulmonic valve regurgitation is trivial by color flow Doppler. Aorta: There is evidence of atheroma immobile plaque in the descending aorta; Grade III, measuring 3-14mm in size. +-------------+-----------++ AORTIC VALVE             +-------------+-----------++ AV Vmax:     135.00 cm/s +-------------+-----------++ AV Vmean:    89.200 cm/s +-------------+-----------++ AV VTI:      0.312 m     +-------------+-----------++ AV Peak Grad:7.3 mmHg     +-------------+-----------++ AV Mean Grad:4.0 mmHg    +-------------+-----------++  Nolon Nations MD Electronically signed by Nolon Nations MD Signature Date/Time: 10/01/2019/11:24:21 AM    Final    VAS US DOPPLER PRE CABG  Result Date: 09/29/2019 PREOPERATIVE VASCULAR EVALUATION  Indications:      Pre-CABG. Risk Factors:     Hypertension, coronary artery disease. Comparison Study: no prior Performing Technologist: [name] RVS  Examination Guidelines: A complete evaluation includes B-mode imaging, spectral Doppler, color Doppler, and power Doppler as needed of all accessible portions of each vessel. Bilateral testing is considered an integral part of a complete examination. Limited examinations for reoccurring indications may be performed as noted.  Right Carotid Findings: +----------+--------+--------+--------+------------+--------+           PSV cm/sEDV cm/sStenosisDescribe    Comments +----------+--------+--------+--------+------------+--------+ CCA Prox  92      15              heterogenous         +----------+--------+--------+--------+------------+--------+ CCA Distal88      17              heterogenous         +----------+--------+--------+--------+------------+--------+ ICA Prox  70      24      1-39%   heterogenous         +----------+--------+--------+--------+------------+--------+ ICA Distal84      26                                   +----------+--------+--------+--------+------------+--------+  ECA       100     12                                   +----------+--------+--------+--------+------------+--------+ Portions of this table do not appear on this page. +----------+--------+-------+--------+------------+           PSV cm/sEDV cmsDescribeArm Pressure +----------+--------+-------+--------+------------+ Subclavian83                                  +----------+--------+-------+--------+------------+  +---------+--------+--+--------+--+---------+ VertebralPSV cm/s43EDV cm/s12Antegrade +---------+--------+--+--------+--+---------+ Left Carotid Findings: +----------+--------+--------+--------+------------+--------+           PSV cm/sEDV cm/sStenosisDescribe    Comments +----------+--------+--------+--------+------------+--------+ CCA Prox  86      9               heterogenous         +----------+--------+--------+--------+------------+--------+ CCA Distal93      18              heterogenous         +----------+--------+--------+--------+------------+--------+ ICA Prox  64      18      1-39%   heterogenous         +----------+--------+--------+--------+------------+--------+ ICA Distal81      31                                   +----------+--------+--------+--------+------------+--------+ ECA       113     9                                    +----------+--------+--------+--------+------------+--------+ +----------+--------+--------+--------+------------+ SubclavianPSV cm/sEDV cm/sDescribeArm Pressure +----------+--------+--------+--------+------------+           133                                  +----------+--------+--------+--------+------------+ +---------+--------+--+--------+--+---------+ VertebralPSV cm/s53EDV cm/s13Antegrade +---------+--------+--+--------+--+---------+  ABI Findings: +--------+------------------+-----+---------+--------+ Right   Rt Pressure (mmHg)IndexWaveform Comment  +--------+------------------+-----+---------+--------+ [id number]                    triphasic         +--------+------------------+-----+---------+--------+ ATA                            triphasic         +--------+------------------+-----+---------+--------+ PTA                            triphasic         +--------+------------------+-----+---------+--------+ +--------+------------------+-----+---------+-------+ Left    Lt  Pressure (mmHg)IndexWaveform Comment +--------+------------------+-----+---------+-------+ Brachial                       triphasic        +--------+------------------+-----+---------+-------+ ATA                            triphasic        +--------+------------------+-----+---------+-------+ PTA  triphasic        +--------+------------------+-----+---------+-------+  Right Doppler Findings: +--------+--------+-----+---------+--------+ Site    PressureIndexDoppler  Comments +--------+--------+-----+---------+--------+ [id number]          triphasic         +--------+--------+-----+---------+--------+ Radial               triphasic         +--------+--------+-----+---------+--------+ Ulnar                triphasic         +--------+--------+-----+---------+--------+  Left Doppler Findings: +--------+--------+-----+---------+--------+ Site    PressureIndexDoppler  Comments +--------+--------+-----+---------+--------+ Brachial             triphasic         +--------+--------+-----+---------+--------+ Radial               triphasic         +--------+--------+-----+---------+--------+ Ulnar                triphasic         +--------+--------+-----+---------+--------+  Summary: Right Carotid: Velocities in the right ICA are consistent with a 1-39% stenosis. Left Carotid: Velocities in the left ICA are consistent with a 1-39% stenosis. Vertebrals: Bilateral vertebral arteries demonstrate antegrade flow. Right Upper Extremity: Doppler waveforms remain within normal limits with right radial compression. Doppler waveforms decrease 50% with right ulnar compression. Left Upper Extremity: Doppler waveforms remain within normal limits with left radial compression. Doppler waveforms decrease 50% with left ulnar compression.  Electronically signed by Deitra Mayo MD on 09/29/2019 at 11:02:11 AM.    Final        Discharge  Instructions    Amb Referral to Cardiac Rehabilitation   Complete by: As directed    Diagnosis: CABG   CABG X ___: 5   After initial evaluation and assessments completed: Virtual Based Care may be provided alone or in conjunction with Phase 2 Cardiac Rehab based on patient barriers.: Yes      Discharge Medications: Allergies as of 10/05/2019      Reactions   Ibuprofen    REACTION: intol to large amounts of ibuprofen      Medication List    STOP taking these medications   aspirin 325 MG tablet Replaced by: aspirin 81 MG EC tablet     TAKE these medications   aspirin 81 MG EC tablet Take 1 tablet (81 mg total) by mouth daily. Swallow whole. Replaces: aspirin 325 MG tablet   cholecalciferol 25 MCG (1000 UNIT) tablet Commonly known as: VITAMIN D3 Take 1,000 Units by mouth daily.   clopidogrel 75 MG tablet Commonly known as: PLAVIX Take 75 mg by mouth daily.   co-enzyme Q-10 30 MG capsule Take 30 mg by mouth daily.   colchicine 0.6 MG tablet Take 1 tablet (0.6 mg total) by mouth 2 (two) times daily.   fluticasone 50 MCG/ACT nasal spray Commonly known as: FLONASE Place 1 spray into both nostrils daily. What changed:   when to take this  reasons to take this   furosemide 40 MG tablet Commonly known as: LASIX Take 1 tablet (40 mg total) by mouth daily. For 5 days then stop   isosorbide dinitrate 10 MG tablet Commonly known as: ISORDIL Take 1 tablet (10 mg total) by mouth 3 (three) times daily. For one month then stop.   levothyroxine 150 MCG tablet Commonly known as: SYNTHROID TAKE 1 TABLET BY MOUTH ONCE DAILY   metoprolol succinate 50 MG 24 hr tablet Commonly known  as: TOPROL-XL Take 50 mg by mouth daily.   multivitamin capsule Take 1 capsule by mouth daily.   oxyCODONE 5 MG immediate release tablet Commonly known as: Oxy IR/ROXICODONE Take 1 tablet (5 mg total) by mouth every 4 (four) hours as needed for severe pain.   potassium chloride SA 20 MEQ  tablet Commonly known as: KLOR-CON Take 1 tablet (20 mEq total) by mouth daily. For 5 days then stop.   vitamin C 250 MG tablet Commonly known as: ASCORBIC ACID Take 250 mg by mouth daily.      The patient has been discharged on:   1.Beta Blocker:  Yes [x   ]                              No   [   ]                              If No, reason:  2.Ace Inhibitor/ARB: Yes [   ]                                     No  [ x   ]                                     If No, reason:Will likely start after discharge once BP allows  3.Statin:   Yes [ x  ]                  No  [   ]                  If No, reason:  4.Ecasa:  Yes  [ x  ]                  No   [   ]                  If No, reason:  Follow Up Appointments:  Follow-up Information    [name], MD. Go on 10/12/2019.   Specialty: Cardiothoracic Surgery Why: Appointment time is at 12:00 pm Contact information: [address] [place] [address] [phone]        [name], MD. Call.   Specialty: Internal Medicine Why: for a follow up regarding HGA1C 5.9 (patient with a history of diet controlled diabetes) Contact information: [place] [address] [phone]        Isaiah Serge, NP Follow up on 10/22/2019.   Specialties: Cardiology, Radiology Why: 2:15PM. Cardiology visit Contact information: [place] [address] [phone]               Signed: Sharalyn Ink ZimmermanPA-C 10/05/2019, 8:16 AM

## 2019-09-30 NOTE — Brief Op Note (Addendum)
09/27/2019 - 09/30/2019  1:36 PM  PATIENT:  [name]  69 y.o. male  PRE-OPERATIVE DIAGNOSIS:  CAD  POST-OPERATIVE DIAGNOSIS:  Coronary Artery Disease  PROCEDURE:  Procedure(s) with comments: CORONARY ARTERY BYPASS GRAFTING (CABG) x 5 ON PUMP. LIMA TO LAD, RIMA TO OM1, RADIAL ARTERY SEQUENCED TO PDA AND OTHER, SVG TO DIAG 1 (N/A) - BILATERAL IMA RADIAL ARTERY HARVEST (Left) TRANSESOPHAGEAL ECHOCARDIOGRAM (TEE) (N/A) INDOCYANINE GREEN FLUORESCENCE IMAGING (ICG) (N/A) ENDOVEIN HARVEST OF GREATER SAPHENOUS VEIN (Right) LIMA-LAD RIMA-OM LEFT RADIAL - PD-PL SVG-DIAG  EVH 45 minutes Radial harvest 65 min  SURGEON:  Surgeon(s) and Role:    * [name], MD - Primary  PHYSICIAN ASSISTANT: Gionna Polak PA-C  ASSISTANTS: STAFF   ANESTHESIA:   general  EBL:  500 mL  BLOOD ADMINISTERED:none  DRAINS: PLEURAL AND MEDIASTINAL CHEST TUBES   LOCAL MEDICATIONS USED:  OTHER EXPAREL  SPECIMEN:  No Specimen  DISPOSITION OF SPECIMEN:  N/A  COUNTS:  YES  TOURNIQUET:  * Missing tourniquet times found for documented tourniquets in log: [id number] *  DICTATION: .Dragon Dictation  PLAN OF CARE: Admit to inpatient   PATIENT DISPOSITION:  ICU - intubated and hemodynamically stable.   Delay start of Pharmacological VTE agent (>24hrs) due to surgical blood loss or risk of bleeding: yes  COMPLICATIONS: NO KNOWN

## 2019-09-30 NOTE — Procedures (Signed)
Extubation Procedure Note  Patient Details:   Name: [name] DOB: March 04, 1950 MRN: [id number]   Airway Documentation:    Vent end date: 09/30/19 Vent end time: 2130   Evaluation  O2 sats: stable throughout Complications: No apparent complications Patient did tolerate procedure well. Bilateral Breath Sounds: Diminished, Clear   Yes  Checked patient for cuff leak, no stridor noted. Pt stable and doing well at this time. Pt placed on 4L [place]. VC was 900 and NIF -20   Alberteen Spindle 09/30/2019, 9:38 PM

## 2019-09-30 NOTE — H&P (Signed)
History and Physical Interval Note:  09/30/2019 7:46 AM  [name]  has presented today for surgery, with the diagnosis of CAD.  The various methods of treatment have been discussed with the patient and family. After consideration of risks, benefits and other options for treatment, the patient has consented to  Procedure(s) with comments: CORONARY ARTERY BYPASS GRAFTING (CABG) (N/A) - BILATERAL IMA RADIAL ARTERY HARVEST (Left) TRANSESOPHAGEAL ECHOCARDIOGRAM (TEE) (N/A) INDOCYANINE GREEN FLUORESCENCE IMAGING (ICG) (N/A) as a surgical intervention.  The patient's history has been reviewed, patient examined, no change in status, stable for surgery.  I have reviewed the patient's chart and labs.  Questions were answered to the patient's satisfaction.     [name]

## 2019-09-30 NOTE — Transfer of Care (Signed)
Immediate Anesthesia Transfer of Care Note  Patient: [name]  Procedure(s) Performed: CORONARY ARTERY BYPASS GRAFTING (CABG) x 5 ON PUMP. LIMA TO LAD, RIMA TO OM1, RADIAL ARTERY SEQUENCED TO PDA AND OTHER, SVG TO DIAG 1 (N/A Chest) RADIAL ARTERY HARVEST (Left Arm Lower) TRANSESOPHAGEAL ECHOCARDIOGRAM (TEE) (N/A ) INDOCYANINE GREEN FLUORESCENCE IMAGING (ICG) (N/A ) ENDOVEIN HARVEST OF GREATER SAPHENOUS VEIN (Right )  Patient Location: ICU  Anesthesia Type:General  Level of Consciousness: sedated  Airway & Oxygen Therapy: Patient remains intubated per anesthesia plan and Patient placed on Ventilator (see vital sign flow sheet for setting)  Post-op Assessment: Report given to RN and Post -op Vital signs reviewed and stable  Post vital signs: Reviewed and stable  Last Vitals:  Vitals Value Taken Time  BP    Temp    Pulse 89 09/30/19 1521  Resp    SpO2 100 % 09/30/19 1521  Vitals shown include unvalidated device data.  Last Pain:  Vitals:   09/30/19 0455  TempSrc: Oral  PainSc:       Patients Stated Pain Goal: 0 (76/15/18 3437)  Complications: No complications documented.

## 2019-09-30 NOTE — Progress Notes (Signed)
Attempting to call report to [phone].  Busy signal.  Will continue to try.

## 2019-09-30 NOTE — Anesthesia Procedure Notes (Signed)
Central Venous Catheter Insertion Performed by: Lillia Abed, MD, anesthesiologist Start/End8/05/2019 7:50 AM, 09/30/2019 8:00 AM Patient location: Pre-op. Preanesthetic checklist: patient identified, IV checked, risks and benefits discussed, surgical consent, monitors and equipment checked, pre-op evaluation, timeout performed and anesthesia consent Position: Trendelenburg Lidocaine 1% used for infiltration and patient sedated Hand hygiene performed  and maximum sterile barriers used  Catheter size: 8.5 Fr Central line was placed.MAC introducer Swan type:thermodilution Procedure performed using ultrasound guided technique. Ultrasound Notes:anatomy identified, needle tip was noted to be adjacent to the nerve/plexus identified, no ultrasound evidence of intravascular and/or intraneural injection and image(s) printed for medical record Attempts: 1 Following insertion, line sutured, dressing applied and Biopatch. Post procedure assessment: blood return through all ports, free fluid flow and no air  Patient tolerated the procedure well with no immediate complications.

## 2019-09-30 NOTE — Progress Notes (Signed)
Patient ID: [name], male   DOB: May 16, 1950, 69 y.o.   MRN: [id number]  TCTS Evening Rounds:   Hemodynamically stable  CI = 2.3  Has started to wake up on vent. Getting ready to extubate.  Urine output good  CT output low  CBC    Component Value Date/Time   WBC 20.2 (H) 09/30/2019 1530   RBC 3.88 (L) 09/30/2019 1530   HGB 11.4 (L) 09/30/2019 1530   HCT 36.1 (L) 09/30/2019 1530   PLT 148 (L) 09/30/2019 1530   MCV 93.0 09/30/2019 1530   MCH 29.4 09/30/2019 1530   MCHC 31.6 09/30/2019 1530   RDW 14.1 09/30/2019 1530   LYMPHSABS 2.4 06/22/2013 1225   MONOABS 0.8 06/22/2013 1225   EOSABS 0.5 06/22/2013 1225   BASOSABS 0.0 06/22/2013 1225     BMET    Component Value Date/Time   NA 140 09/30/2019 1352   K 4.0 09/30/2019 1352   CL 102 09/30/2019 1352   CO2 25 09/30/2019 0508   GLUCOSE 131 (H) 09/30/2019 1352   BUN 17 09/30/2019 1352   CREATININE 0.80 09/30/2019 1352   CALCIUM 8.9 09/30/2019 0508   GFRNONAA >60 09/30/2019 0508   GFRAA >60 09/30/2019 0508     A/P:  Stable postop course. Continue current plans

## 2019-09-30 NOTE — Progress Notes (Addendum)
  Echocardiogram Transesophageal echo has been performed.  [name] 09/30/2019, 9:37 AM

## 2019-09-30 NOTE — Anesthesia Procedure Notes (Signed)
Procedure Name: Intubation Date/Time: 09/30/2019 8:54 AM Performed by: Imagene Riches, CRNA Pre-anesthesia Checklist: Patient identified, Emergency Drugs available, Suction available and Patient being monitored Patient Re-evaluated:Patient Re-evaluated prior to induction Oxygen Delivery Method: Circle System Utilized Preoxygenation: Pre-oxygenation with 100% oxygen Induction Type: IV induction and Cricoid Pressure applied Ventilation: Mask ventilation without difficulty and Oral airway inserted - appropriate to patient size Laryngoscope Size: Mac and 4 Grade View: Grade I Tube type: Oral Tube size: 8.0 mm Number of attempts: 1 Airway Equipment and Method: Stylet and Oral airway Placement Confirmation: ETT inserted through vocal cords under direct vision,  positive ETCO2 and breath sounds checked- equal and bilateral Secured at: 23 cm Tube secured with: Tape Dental Injury: Teeth and Oropharynx as per pre-operative assessment  Comments: Placed by D. [place], [place]

## 2019-09-30 NOTE — Progress Notes (Signed)
Pt flipped to 40/4 by RT Gia per RWP.

## 2019-09-30 NOTE — Anesthesia Procedure Notes (Signed)
Arterial Line Insertion Start/End8/05/2019 7:40 AM, 09/30/2019 7:50 AM Performed by: Imagene Riches, CRNA, CRNA  Patient location: Pre-op. Preanesthetic checklist: patient identified, IV checked, site marked, risks and benefits discussed, surgical consent, monitors and equipment checked, pre-op evaluation, timeout performed and anesthesia consent Lidocaine 1% used for infiltration Right, radial was placed Catheter size: 20 Fr Hand hygiene performed , maximum sterile barriers used  and Seldinger technique used  Attempts: 1 Procedure performed without using ultrasound guided technique. Following insertion, dressing applied and Biopatch. Post procedure assessment: normal and unchanged  Patient tolerated the procedure well with no immediate complications. Additional procedure comments: Placed by D. [place], [place].

## 2019-09-30 NOTE — Discharge Instructions (Signed)

## 2019-10-01 ENCOUNTER — Inpatient Hospital Stay (HOSPITAL_COMMUNITY): Payer: Medicare Other

## 2019-10-01 ENCOUNTER — Telehealth: Payer: Self-pay | Admitting: Cardiology

## 2019-10-01 ENCOUNTER — Encounter (HOSPITAL_COMMUNITY): Payer: Self-pay | Admitting: Cardiothoracic Surgery

## 2019-10-01 ENCOUNTER — Telehealth: Payer: Self-pay | Admitting: *Deleted

## 2019-10-01 LAB — CBC
HCT: 35 % — ABNORMAL LOW (ref 39.0–52.0)
HCT: 32.5 % — ABNORMAL LOW (ref 39.0–52.0)
Hemoglobin: 11.4 g/dL — ABNORMAL LOW (ref 13.0–17.0)
Hemoglobin: 10.5 g/dL — ABNORMAL LOW (ref 13.0–17.0)
MCH: 30.4 pg (ref 26.0–34.0)
MCH: 30.4 pg (ref 26.0–34.0)
MCHC: 32.6 g/dL (ref 30.0–36.0)
MCHC: 32.3 g/dL (ref 30.0–36.0)
MCV: 93.3 fL (ref 80.0–100.0)
MCV: 94.2 fL (ref 80.0–100.0)
Platelets: 181 10*3/uL (ref 150–400)
Platelets: 161 10*3/uL (ref 150–400)
RBC: 3.75 MIL/uL — ABNORMAL LOW (ref 4.22–5.81)
RBC: 3.45 MIL/uL — ABNORMAL LOW (ref 4.22–5.81)
RDW: 14.4 % (ref 11.5–15.5)
RDW: 14.6 % (ref 11.5–15.5)
WBC: 23.2 10*3/uL — ABNORMAL HIGH (ref 4.0–10.5)
WBC: 19.2 10*3/uL — ABNORMAL HIGH (ref 4.0–10.5)
nRBC: 0 % (ref 0.0–0.2)
nRBC: 0 % (ref 0.0–0.2)

## 2019-10-01 LAB — ECHO INTRAOPERATIVE TEE
AV Mean grad: 4 mmHg
AV Peak grad: 7.3 mmHg
Ao pk vel: 1.35 m/s
Height: 73 in
Weight: 3497.6 oz

## 2019-10-01 LAB — BASIC METABOLIC PANEL
Anion gap: 9 (ref 5–15)
Anion gap: 8 (ref 5–15)
BUN: 17 mg/dL (ref 8–23)
BUN: 22 mg/dL (ref 8–23)
CO2: 22 mmol/L (ref 22–32)
CO2: 22 mmol/L (ref 22–32)
Calcium: 7.7 mg/dL — ABNORMAL LOW (ref 8.9–10.3)
Calcium: 7.6 mg/dL — ABNORMAL LOW (ref 8.9–10.3)
Chloride: 107 mmol/L (ref 98–111)
Chloride: 103 mmol/L (ref 98–111)
Creatinine, Ser: 1.12 mg/dL (ref 0.61–1.24)
Creatinine, Ser: 1.21 mg/dL (ref 0.61–1.24)
GFR calc Af Amer: >60 mL/min (ref >60)
GFR calc Af Amer: >60 mL/min (ref >60)
GFR calc non Af Amer: >60 mL/min (ref >60)
GFR calc non Af Amer: >60 mL/min (ref >60)
Glucose, Bld: 118 mg/dL — ABNORMAL HIGH (ref 70–99)
Glucose, Bld: 106 mg/dL — ABNORMAL HIGH (ref 70–99)
Potassium: 4.2 mmol/L (ref 3.5–5.1)
Potassium: 4.6 mmol/L (ref 3.5–5.1)
Sodium: 138 mmol/L (ref 135–145)
Sodium: 133 mmol/L — ABNORMAL LOW (ref 135–145)

## 2019-10-01 LAB — POCT I-STAT 7, (LYTES, BLD GAS, ICA,H+H)
Acid-base deficit: 1 mmol/L (ref 0.0–2.0)
Acid-base deficit: 3 mmol/L — ABNORMAL HIGH (ref 0.0–2.0)
Acid-base deficit: 4 mmol/L — ABNORMAL HIGH (ref 0.0–2.0)
Bicarbonate: 22.5 mmol/L (ref 20.0–28.0)
Bicarbonate: 23.2 mmol/L (ref 20.0–28.0)
Bicarbonate: 25.7 mmol/L (ref 20.0–28.0)
Calcium, Ion: 1.1 mmol/L — ABNORMAL LOW (ref 1.15–1.40)
Calcium, Ion: 1.1 mmol/L — ABNORMAL LOW (ref 1.15–1.40)
Calcium, Ion: 1.11 mmol/L — ABNORMAL LOW (ref 1.15–1.40)
HCT: 31 % — ABNORMAL LOW (ref 39.0–52.0)
HCT: 35 % — ABNORMAL LOW (ref 39.0–52.0)
HCT: 36 % — ABNORMAL LOW (ref 39.0–52.0)
Hemoglobin: 10.5 g/dL — ABNORMAL LOW (ref 13.0–17.0)
Hemoglobin: 11.9 g/dL — ABNORMAL LOW (ref 13.0–17.0)
Hemoglobin: 12.2 g/dL — ABNORMAL LOW (ref 13.0–17.0)
O2 Saturation: 88 %
O2 Saturation: 96 %
O2 Saturation: 97 %
Patient temperature: 36.5
Patient temperature: 37.4
Patient temperature: 37.6
Potassium: 4 mmol/L (ref 3.5–5.1)
Potassium: 4.2 mmol/L (ref 3.5–5.1)
Potassium: 4.2 mmol/L (ref 3.5–5.1)
Sodium: 140 mmol/L (ref 135–145)
Sodium: 141 mmol/L (ref 135–145)
Sodium: 142 mmol/L (ref 135–145)
TCO2: 24 mmol/L (ref 22–32)
TCO2: 25 mmol/L (ref 22–32)
TCO2: 27 mmol/L (ref 22–32)
pCO2 arterial: 44.9 mmHg (ref 32.0–48.0)
pCO2 arterial: 49.1 mmHg — ABNORMAL HIGH (ref 32.0–48.0)
pCO2 arterial: 49.7 mmHg — ABNORMAL HIGH (ref 32.0–48.0)
pH, Arterial: 7.285 — ABNORMAL LOW (ref 7.350–7.450)
pH, Arterial: 7.31 — ABNORMAL LOW (ref 7.350–7.450)
pH, Arterial: 7.32 — ABNORMAL LOW (ref 7.350–7.450)
pO2, Arterial: 62 mmHg — ABNORMAL LOW (ref 83.0–108.0)
pO2, Arterial: 93 mmHg (ref 83.0–108.0)
pO2, Arterial: 98 mmHg (ref 83.0–108.0)

## 2019-10-01 LAB — GLUCOSE, CAPILLARY
Glucose-Capillary: 102 mg/dL — ABNORMAL HIGH (ref 70–99)
Glucose-Capillary: 104 mg/dL — ABNORMAL HIGH (ref 70–99)
Glucose-Capillary: 105 mg/dL — ABNORMAL HIGH (ref 70–99)
Glucose-Capillary: 106 mg/dL — ABNORMAL HIGH (ref 70–99)
Glucose-Capillary: 107 mg/dL — ABNORMAL HIGH (ref 70–99)
Glucose-Capillary: 112 mg/dL — ABNORMAL HIGH (ref 70–99)
Glucose-Capillary: 114 mg/dL — ABNORMAL HIGH (ref 70–99)
Glucose-Capillary: 117 mg/dL — ABNORMAL HIGH (ref 70–99)
Glucose-Capillary: 118 mg/dL — ABNORMAL HIGH (ref 70–99)

## 2019-10-01 LAB — MAGNESIUM
Magnesium: 2.4 mg/dL (ref 1.7–2.4)
Magnesium: 2.5 mg/dL — ABNORMAL HIGH (ref 1.7–2.4)

## 2019-10-01 MED ORDER — ISOSORBIDE DINITRATE 10 MG PO TABS
10.0000 mg | ORAL_TABLET | Freq: Three times a day (TID) | ORAL | Status: DC
  Administered 2019-10-01 – 2019-10-05 (×13): 10 mg via ORAL
  Filled 2019-10-01 (×13): qty 1

## 2019-10-01 MED ORDER — INSULIN ASPART 100 UNIT/ML ~~LOC~~ SOLN: 2.0000 [IU] | SUBCUTANEOUS | Status: DC

## 2019-10-01 MED ORDER — CLOPIDOGREL BISULFATE 75 MG PO TABS
75.0000 mg | ORAL_TABLET | Freq: Every day | ORAL | Status: DC
  Administered 2019-10-01 – 2019-10-05 (×5): 75 mg via ORAL
  Filled 2019-10-01 (×5): qty 1

## 2019-10-01 MED ORDER — SODIUM CHLORIDE 0.9% FLUSH: 10.0000 mL | INTRAVENOUS | Status: DC | PRN

## 2019-10-01 MED ORDER — LIDOCAINE 5 % EX PTCH
1.0000 | MEDICATED_PATCH | CUTANEOUS | Status: DC
  Administered 2019-10-01 – 2019-10-04 (×4): 1 via TRANSDERMAL
  Filled 2019-10-01 (×6): qty 1

## 2019-10-01 MED ORDER — SODIUM CHLORIDE 0.9% FLUSH
10.0000 mL | Freq: Two times a day (BID) | INTRAVENOUS | Status: DC
  Administered 2019-10-01 – 2019-10-02 (×2): 10 mL

## 2019-10-01 MED ORDER — COLCHICINE 0.6 MG PO TABS
0.6000 mg | ORAL_TABLET | Freq: Two times a day (BID) | ORAL | Status: DC
  Administered 2019-10-01 – 2019-10-05 (×8): 0.6 mg via ORAL
  Filled 2019-10-01 (×8): qty 1

## 2019-10-01 MED ORDER — KETOROLAC TROMETHAMINE 15 MG/ML IJ SOLN
7.5000 mg | Freq: Four times a day (QID) | INTRAMUSCULAR | Status: AC
  Administered 2019-10-01 – 2019-10-02 (×5): 7.5 mg via INTRAVENOUS
  Filled 2019-10-01 (×5): qty 1

## 2019-10-01 MED ORDER — ASPIRIN EC 81 MG PO TBEC
81.0000 mg | DELAYED_RELEASE_TABLET | Freq: Every day | ORAL | Status: DC
  Administered 2019-10-01 – 2019-10-05 (×5): 81 mg via ORAL
  Filled 2019-10-01 (×5): qty 1

## 2019-10-01 MED ORDER — DIAZEPAM 2 MG PO TABS
2.0000 mg | ORAL_TABLET | Freq: Three times a day (TID) | ORAL | Status: DC
  Administered 2019-10-01 – 2019-10-05 (×12): 2 mg via ORAL
  Filled 2019-10-01 (×12): qty 1

## 2019-10-01 MED ORDER — COLCHICINE 0.3 MG HALF TABLET
0.3000 mg | ORAL_TABLET | Freq: Two times a day (BID) | ORAL | Status: DC
  Filled 2019-10-01: qty 1

## 2019-10-01 MED ORDER — THIAMINE HCL 100 MG/ML IJ SOLN
Freq: Once | INTRAVENOUS | Status: AC
  Filled 2019-10-01: qty 1000

## 2019-10-01 MED FILL — Heparin Sodium (Porcine) Inj 1000 Unit/ML: INTRAMUSCULAR | Qty: 30 | Status: AC

## 2019-10-01 MED FILL — Potassium Chloride Inj 2 mEq/ML: INTRAVENOUS | Qty: 40 | Status: AC

## 2019-10-01 MED FILL — Sodium Chloride IV Soln 0.9%: INTRAVENOUS | Qty: 2000 | Status: AC

## 2019-10-01 MED FILL — Heparin Sodium (Porcine) Inj 1000 Unit/ML: INTRAMUSCULAR | Qty: 20 | Status: AC

## 2019-10-01 MED FILL — Sodium Bicarbonate IV Soln 8.4%: INTRAVENOUS | Qty: 50 | Status: AC

## 2019-10-01 MED FILL — Magnesium Sulfate Inj 50%: INTRAMUSCULAR | Qty: 10 | Status: AC

## 2019-10-01 MED FILL — Mannitol IV Soln 20%: INTRAVENOUS | Qty: 500 | Status: AC

## 2019-10-01 MED FILL — Electrolyte-R (PH 7.4) Solution: INTRAVENOUS | Qty: 4000 | Status: AC

## 2019-10-01 NOTE — Progress Notes (Signed)
TCTS BRIEF SICU PROGRESS NOTE  1 Day Post-Op  S/P Procedure(s) (LRB): CORONARY ARTERY BYPASS GRAFTING (CABG) x 5 ON PUMP. LIMA TO LAD, RIMA TO OM1, RADIAL ARTERY SEQUENCED TO PDA AND OTHER, SVG TO DIAG 1 (N/A) RADIAL ARTERY HARVEST (Left) TRANSESOPHAGEAL ECHOCARDIOGRAM (TEE) (N/A) INDOCYANINE GREEN FLUORESCENCE IMAGING (ICG) (N/A) ENDOVEIN HARVEST OF GREATER SAPHENOUS VEIN (Right)   Stable day NSR w/ stable BP on low dose Neo and NTG drips Breathing comfortably w/ O2 sats 94-100% on 6 L/min UOP adequate  Plan: Continue current plan  Rexene Alberts, MD 10/01/2019 6:12 PM

## 2019-10-01 NOTE — Telephone Encounter (Signed)
Pt has appt with Cecilie Kicks and records were faxed from [facility] to [facility] office.

## 2019-10-01 NOTE — Telephone Encounter (Signed)
Per Octaviano Batty, set up [facility] visit on 10/22/19 at 2:15 am with Cecilie Kicks

## 2019-10-01 NOTE — Op Note (Addendum)
CARDIOTHORACIC SURGERY OPERATIVE NOTE  Date of Procedure: 10/01/2019  Preoperative Diagnosis: Severe 3-vessel Coronary Artery Disease  Postoperative Diagnosis: Same  Procedure:    Coronary Artery Bypass Grafting x 5  Left Internal Mammary Artery to Distal Left Anterior Descending Coronary Artery; left radial artery graft to Posterior Descending Coronary Artery and right PLA as sequenced graft; pedicled RIMA to  Obtuse Marginal Branch of Left Circumflex Coronary Artery; Sapheonous Vein Graft to Diagonal Branch Coronary Artery, Endoscopic Vein Harvest from right Thigh Bilateral IMA harvesting Open left radial artery harvesting Completion graft surveillance with indocyanine green fluorescence imaging (SPY)  Surgeon: B. Murvin Natal, MD  Assistant: Evonnie Pat PA-C  Anesthesia: get  Operative Findings:  preserved left ventricular systolic function  Good quality internal mammary artery conduits  Fair quality saphenous vein conduit; good quality radial artery conduit  good quality target vessels for grafting    BRIEF CLINICAL NOTE AND INDICATIONS FOR SURGERY  69 yo man admitted with progressive angina. He has a PMHx of PCI. Admitted with crescendo angina and underwent LHC showing severe multivessel CAD. Consult received for CABG. He has been thoroughly evaluated and is considered a good candidate for surgery.    DETAILS OF THE OPERATIVE PROCEDURE  Preparation:  The patient is brought to the operating room on the above mentioned date and central monitoring was established by the anesthesia team including placement of Swan-Ganz catheter and radial arterial line. The patient is placed in the supine position on the operating table.  Intravenous antibiotics are administered. General endotracheal anesthesia is induced uneventfully. A Foley catheter is placed.  Baseline transesophageal echocardiogram was performed.  Findings were notable for no significant valvular disease and preserved  biventricular function  The patient's chest, abdomen, left upper extremity, both groins, and both lower extremities are prepared and draped in a sterile manner. A time out procedure is performed.   Surgical Approach and Conduit Harvest:  A median sternotomy incision was performed and the left internal mammary artery is dissected from the chest wall and prepared for bypass grafting. The left internal mammary artery is notably good quality conduit. Simultaneously, open harvesting of the left radial artery is performed.  Once this is completed, the incision in the left forearm is repaired in layers and the arm is retucked at the side.  Next, attention was turned to the right hemithorax of the right internal mammary artery and its pedicle mobilized in standard fashion.  At the same time, the greater saphenous vein is obtained from the patient's right thigh using endoscopic vein harvest technique. The saphenous vein is notably fair quality conduit due to increased diameter/varicosity. After removal of the saphenous vein, the small surgical incisions in the lower extremity are closed with absorbable suture. Following systemic heparinization, the  internal mammary artery pedicles were transected distally noted to have excellent flow.  Both were treated with a solution of papaverine.   Extracorporeal Cardiopulmonary Bypass and Myocardial Protection:  The pericardium is opened. The ascending aorta is nondiseased in appearance. The ascending aorta and the right atrium are cannulated for cardiopulmonary bypass.  Adequate heparinization is verified.   Cardiopulmonary bypass was begun and the surface of the heart is inspected. Distal target vessels are selected for coronary artery bypass grafting. A cardioplegia cannula is placed in the ascending aorta.   The patient is allowed to cool passively to 34C systemic temperature.  The aortic cross clamp is applied and cold blood cardioplegia is delivered initially in an  antegrade fashion through the aortic root.  Iced saline slush is applied for topical hypothermia.  The initial cardioplegic arrest is rapid with early diastolic arrest.  Repeat doses of cardioplegia are administered intermittently throughout the entire cross clamp portion of the operation through the aortic root and through subsequently placed vein grafts in order to maintain completely flat electrocardiogram.   Coronary Artery Bypass Grafting:   The posterior descending branch of the right coronary artery and the posterior lateral branch of the right coronary artery were grafted using a reversed saphenous vein graft in an end-to-side fashion.  At the site of distal anastomoses the target vessels were good quality and measured approximately 1.5 mm in diameter.  The first obtuse marginal branch of the left circumflex coronary artery was grafted using the pedicled right internal mammary artery graft brought through the transverse sinus.  The anastomosis was done in an end-to-side fashion.  At the site of distal anastomosis the target vessel was good quality and measured approximately 1.5 mm in diameter. Anastomotic patency and runoff was confirmed with indocyanine green fluorescence imaging (SPY).  The  diagonal branch of the left anterior descending coronary artery was grafted using a reversed saphenous vein graft in an end-to-side fashion.  At the site of distal anastomosis the target vessel was good quality and measured approximately 1.5 mm in diameter.  The distal left anterior coronary artery was grafted with the left internal mammary artery in an end-to-side fashion.  At the site of distal anastomosis the target vessel was good quality and measured approximately 2 mm in diameter. Anastomotic patency and runoff was confirmed with indocyanine green fluorescence imaging (SPY).  All proximal anastomoses were placed directly to the ascending aorta prior to removal of the aortic cross clamp.  De-airing  procedures were performed and the aortic cross-clamp was removed.   Procedure Completion:  All proximal and distal coronary anastomoses were inspected for hemostasis and appropriate graft orientation. Epicardial pacing wires are fixed to the right ventricular outflow tract and to the right atrial appendage. The patient is rewarmed to 37C temperature. The patient is weaned and disconnected from cardiopulmonary bypass.  The patient's rhythm at separation from bypass was normal sinus.  The patient was weaned from cardiopulmonary bypass  without inotropic support.   Followup transesophageal echocardiogram performed after separation from bypass revealed no changes from the preoperative exam.  The aortic and venous cannula were removed uneventfully. Protamine was administered to reverse the anticoagulation. The mediastinum and pleural space were inspected for hemostasis and irrigated with saline solution. The mediastinum and bilateral pleural spaces were drained using fluted chest tubes placed through separate stab incisions inferiorly.  The soft tissues anterior to the aorta were reapproximated loosely. The sternum is closed with double strength sternal wire. The soft tissues anterior to the sternum were closed in multiple layers and the skin is closed with a running subcuticular skin closure. The post-bypass portion of the operation was notable for stable rhythm and hemodynamics.    Disposition:  The patient tolerated the procedure well and is transported to the surgical intensive care in stable condition. There are no intraoperative complications. All sponge instrument and needle counts are verified correct at completion of the operation.    [name] Cloud, MD 10/01/2019 11:45 AM  The date of service should read 09/30/19. Demetric Parslow Z. Orvan Seen, [place]

## 2019-10-01 NOTE — Anesthesia Postprocedure Evaluation (Signed)
Anesthesia Post Note  Patient: [name]  Procedure(s) Performed: CORONARY ARTERY BYPASS GRAFTING (CABG) x 5 ON PUMP. LIMA TO LAD, RIMA TO OM1, RADIAL ARTERY SEQUENCED TO PDA AND OTHER, SVG TO DIAG 1 (N/A Chest) RADIAL ARTERY HARVEST (Left Arm Lower) TRANSESOPHAGEAL ECHOCARDIOGRAM (TEE) (N/A ) INDOCYANINE GREEN FLUORESCENCE IMAGING (ICG) (N/A ) ENDOVEIN HARVEST OF GREATER SAPHENOUS VEIN (Right )     Patient location during evaluation: SICU Anesthesia Type: General Level of consciousness: sedated and patient cooperative Pain management: pain level controlled Vital Signs Assessment: post-procedure vital signs reviewed and stable Respiratory status: spontaneous breathing Cardiovascular status: stable Anesthetic complications: no   No complications documented.  Last Vitals:  Vitals:   10/01/19 1200 10/01/19 1300  BP: 101/66 (!) 89/64  Pulse:    Resp: 14 11  Temp: 36.8 C   SpO2: 99% 99%    Last Pain:  Vitals:   10/01/19 0704  TempSrc:   PainSc: [place]

## 2019-10-01 NOTE — Progress Notes (Signed)
1 Day Post-Op Procedure(s) (LRB): CORONARY ARTERY BYPASS GRAFTING (CABG) x 5 ON PUMP. LIMA TO LAD, RIMA TO OM1, RADIAL ARTERY SEQUENCED TO PDA AND OTHER, SVG TO DIAG 1 (N/A) RADIAL ARTERY HARVEST (Left) TRANSESOPHAGEAL ECHOCARDIOGRAM (TEE) (N/A) INDOCYANINE GREEN FLUORESCENCE IMAGING (ICG) (N/A) ENDOVEIN HARVEST OF GREATER SAPHENOUS VEIN (Right) Subjective: Back pain  Objective: Vital signs in last 24 hours: Temp:  [97.7 F (36.5 C)-100 F (37.8 C)] 99 F (37.2 C) (08/05 0800) Pulse Rate:  [80-89] 89 (08/04 2017) Cardiac Rhythm: Normal sinus rhythm (08/05 0800) Resp:  [0-31] 22 (08/05 0800) BP: (91-123)/(48-76) 113/75 (08/05 0800) SpO2:  [89 %-100 %] 98 % (08/05 0800) Arterial Line BP: (74-158)/(42-82) 77/70 (08/05 0800) FiO2 (%):  [40 %-50 %] 40 % (08/04 2045) Weight:  [104.3 kg] 104.3 kg (08/05 0500)  Hemodynamic parameters for last 24 hours: PAP: (16-41)/(5-27) 25/11 CO:  [4.1 L/min-8 L/min] 7.2 L/min CI:  [1.8 L/min/m2-3.4 L/min/m2] 3.2 L/min/m2  Intake/Output from previous day: 08/04 0701 - 08/05 0700 In: 5987.4 [P.O.:480; I.V.:3231.6; Blood:584; IV Piggyback:1691.8] Out: 8937 [Urine:1790; Emesis/NG output:200; Blood:1000; Chest Tube:930] Intake/Output this shift: Total I/O In: -  Out: 120 [Urine:60; Chest Tube:60]  General appearance: alert and cooperative Neurologic: intact Heart: regular rate and rhythm, S1, S2 normal, no murmur, click, rub or gallop Lungs: clear to auscultation bilaterally Abdomen: soft, non-tender; bowel sounds normal; no masses,  no organomegaly Extremities: extremities normal, atraumatic, no cyanosis or edema Wound: dressed, dry  Lab Results: Recent Labs    09/30/19 2212 09/30/19 2238 10/01/19 0418 10/01/19 0619  WBC 19.2*  --  23.2*  --   HGB 11.0*   < > 11.4* 12.2*  HCT 35.0*   < > 35.0* 36.0*  PLT 155  --  181  --    < > = values in this interval not displayed.   BMET:  Recent Labs    09/30/19 2212 09/30/19 2238  10/01/19 0418 10/01/19 0619  NA 135   < > 138 140  K 4.1   < > 4.2 4.2  CL 106  --  107  --   CO2 19*  --  22  --   GLUCOSE 133*  --  118*  --   BUN 17  --  17  --   CREATININE 1.04  --  1.12  --   CALCIUM 7.3*  --  7.7*  --    < > = values in this interval not displayed.    PT/INR:  Recent Labs    09/30/19 1530  LABPROT 15.8*  INR 1.3*   ABG    Component Value Date/Time   PHART 7.310 (L) 10/01/2019 0619   HCO3 22.5 10/01/2019 0619   TCO2 24 10/01/2019 0619   ACIDBASEDEF 4.0 (H) 10/01/2019 0619   O2SAT 88.0 10/01/2019 0619   CBG (last 3)  Recent Labs    10/01/19 0622 10/01/19 0731 10/01/19 0840  GLUCAP 112* 117* 114*    Assessment/Plan: S/P Procedure(s) (LRB): CORONARY ARTERY BYPASS GRAFTING (CABG) x 5 ON PUMP. LIMA TO LAD, RIMA TO OM1, RADIAL ARTERY SEQUENCED TO PDA AND OTHER, SVG TO DIAG 1 (N/A) RADIAL ARTERY HARVEST (Left) TRANSESOPHAGEAL ECHOCARDIOGRAM (TEE) (N/A) INDOCYANINE GREEN FLUORESCENCE IMAGING (ICG) (N/A) ENDOVEIN HARVEST OF GREATER SAPHENOUS VEIN (Right) Mobilize pain control  Continue to resuscitate Out of bed   LOS: 3 days    [name] 10/01/2019

## 2019-10-02 LAB — MAGNESIUM: Magnesium: 2.6 mg/dL — ABNORMAL HIGH (ref 1.7–2.4)

## 2019-10-02 LAB — CBC
HCT: 30.4 % — ABNORMAL LOW (ref 39.0–52.0)
Hemoglobin: 9.6 g/dL — ABNORMAL LOW (ref 13.0–17.0)
MCH: 29.8 pg (ref 26.0–34.0)
MCHC: 31.6 g/dL (ref 30.0–36.0)
MCV: 94.4 fL (ref 80.0–100.0)
Platelets: 154 10*3/uL (ref 150–400)
RBC: 3.22 MIL/uL — ABNORMAL LOW (ref 4.22–5.81)
RDW: 14.6 % (ref 11.5–15.5)
WBC: 16.1 10*3/uL — ABNORMAL HIGH (ref 4.0–10.5)
nRBC: 0 % (ref 0.0–0.2)

## 2019-10-02 LAB — BASIC METABOLIC PANEL
Anion gap: 8 (ref 5–15)
BUN: 27 mg/dL — ABNORMAL HIGH (ref 8–23)
CO2: 25 mmol/L (ref 22–32)
Calcium: 7.9 mg/dL — ABNORMAL LOW (ref 8.9–10.3)
Chloride: 102 mmol/L (ref 98–111)
Creatinine, Ser: 1.19 mg/dL (ref 0.61–1.24)
GFR calc Af Amer: >60 mL/min (ref >60)
GFR calc non Af Amer: >60 mL/min (ref >60)
Glucose, Bld: 103 mg/dL — ABNORMAL HIGH (ref 70–99)
Potassium: 4.5 mmol/L (ref 3.5–5.1)
Sodium: 135 mmol/L (ref 135–145)

## 2019-10-02 LAB — GLUCOSE, CAPILLARY
Glucose-Capillary: 103 mg/dL — ABNORMAL HIGH (ref 70–99)
Glucose-Capillary: 106 mg/dL — ABNORMAL HIGH (ref 70–99)
Glucose-Capillary: 107 mg/dL — ABNORMAL HIGH (ref 70–99)
Glucose-Capillary: 114 mg/dL — ABNORMAL HIGH (ref 70–99)
Glucose-Capillary: 130 mg/dL — ABNORMAL HIGH (ref 70–99)

## 2019-10-02 MED ORDER — FUROSEMIDE 40 MG PO TABS
40.0000 mg | ORAL_TABLET | Freq: Every day | ORAL | Status: DC
  Administered 2019-10-03 – 2019-10-05 (×3): 40 mg via ORAL
  Filled 2019-10-02 (×3): qty 1

## 2019-10-02 MED ORDER — LEVOTHYROXINE SODIUM 75 MCG PO TABS
150.0000 ug | ORAL_TABLET | Freq: Every day | ORAL | Status: DC
  Administered 2019-10-03 – 2019-10-05 (×3): 150 ug via ORAL
  Filled 2019-10-02 (×3): qty 2

## 2019-10-02 MED ORDER — POTASSIUM CHLORIDE CRYS ER 20 MEQ PO TBCR
20.0000 meq | EXTENDED_RELEASE_TABLET | Freq: Two times a day (BID) | ORAL | Status: DC
  Administered 2019-10-03 – 2019-10-05 (×5): 20 meq via ORAL
  Filled 2019-10-02 (×5): qty 1

## 2019-10-02 MED ORDER — SODIUM CHLORIDE 0.9 % IV SOLN: 250.0000 mL | INTRAVENOUS | Status: DC | PRN

## 2019-10-02 MED ORDER — FUROSEMIDE 10 MG/ML IJ SOLN
40.0000 mg | Freq: Once | INTRAMUSCULAR | Status: AC
  Administered 2019-10-02: 40 mg via INTRAVENOUS
  Filled 2019-10-02: qty 4

## 2019-10-02 MED ORDER — SODIUM CHLORIDE 0.9% FLUSH: 3.0000 mL | INTRAVENOUS | Status: DC | PRN

## 2019-10-02 MED ORDER — SODIUM CHLORIDE 0.9% FLUSH
3.0000 mL | Freq: Two times a day (BID) | INTRAVENOUS | Status: DC
  Administered 2019-10-02 – 2019-10-05 (×5): 3 mL via INTRAVENOUS

## 2019-10-02 MED ORDER — ~~LOC~~ CARDIAC SURGERY, PATIENT & FAMILY EDUCATION: Freq: Once | Status: DC

## 2019-10-02 MED ORDER — ALUM & MAG HYDROXIDE-SIMETH 200-200-20 MG/5ML PO SUSP: 15.0000 mL | Freq: Four times a day (QID) | ORAL | Status: DC | PRN

## 2019-10-02 MED ORDER — MAGNESIUM HYDROXIDE 400 MG/5ML PO SUSP: 30.0000 mL | Freq: Every day | ORAL | Status: DC | PRN

## 2019-10-02 NOTE — Plan of Care (Signed)
  Problem: Clinical Measurements: Goal: Ability to maintain clinical measurements within normal limits will improve Outcome: Progressing Goal: Will remain free from infection Outcome: Progressing Goal: Diagnostic test results will improve Outcome: Progressing Goal: Respiratory complications will improve Outcome: Completed/Met Goal: Cardiovascular complication will be avoided Outcome: Progressing   Problem: Activity: Goal: Risk for activity intolerance will decrease Outcome: Progressing

## 2019-10-02 NOTE — Progress Notes (Addendum)
[facility] DAILY ICU PROGRESS NOTE                   [address] [place]            [place] [address]          [phone]   2 Days Post-Op Procedure(s) (LRB): CORONARY ARTERY BYPASS GRAFTING (CABG) x 5 ON PUMP. LIMA TO LAD, RIMA TO OM1, RADIAL ARTERY SEQUENCED TO PDA AND OTHER, SVG TO DIAG 1 (N/A) RADIAL ARTERY HARVEST (Left) TRANSESOPHAGEAL ECHOCARDIOGRAM (TEE) (N/A) INDOCYANINE GREEN FLUORESCENCE IMAGING (ICG) (N/A) ENDOVEIN HARVEST OF GREATER SAPHENOUS VEIN (Right)  Total Length of Stay:  LOS: 4 days   Subjective: Patient sitting in chair. He has no specific complaint this am.  Objective: Vital signs in last 24 hours: Temp:  [98.2 F (36.8 C)-98.9 F (37.2 C)] 98.9 F (37.2 C) (08/06 0400) Cardiac Rhythm: Normal sinus rhythm (08/06 0800) Resp:  [11-27] 22 (08/06 0800) BP: (89-112)/(61-69) 104/68 (08/06 0800) SpO2:  [88 %-100 %] 97 % (08/06 0800) Arterial Line BP: (92-114)/(44-59) 114/44 (08/05 1700) Weight:  [106.5 kg] 106.5 kg (08/06 0630)  Filed Weights   09/30/19 0300 10/01/19 0500 10/02/19 0630  Weight: 99.2 kg 104.3 kg 106.5 kg    Weight change: 2.2 kg   Hemodynamic parameters for last 24 hours: PAP: (23-29)/(11-15) 29/15  Intake/Output from previous day: 08/05 0701 - 08/06 0700 In: 544.8 [I.V.:344.8; IV Piggyback:200] Out: 1145 [Urine:635; Chest Tube:510]  Intake/Output this shift: Total I/O In: 227.6 [I.V.:152.4; IV Piggyback:75.2] Out: 70 [Urine:50; Chest Tube:20]  Current Meds: Scheduled Meds:  acetaminophen  1,000 mg Oral Q6H   Or   acetaminophen (TYLENOL) oral liquid 160 mg/5 mL  1,000 mg Per Tube Q6H   aspirin EC  81 mg Oral Daily   bisacodyl  10 mg Oral Daily   Or   bisacodyl  10 mg Rectal Daily   Chlorhexidine Gluconate Cloth  6 each Topical Daily   clopidogrel  75 mg Oral Daily   colchicine  0.6 mg Oral BID   diazepam  2 mg Oral Q8H   docusate sodium  200 mg Oral Daily   insulin aspart  2-6 Units Subcutaneous Q4H     isosorbide dinitrate  10 mg Oral TID   ketorolac  7.5 mg Intravenous Q6H   levothyroxine  150 mcg Oral Daily   lidocaine  1 patch Transdermal Q24H   metoprolol tartrate  12.5 mg Oral BID   Or   metoprolol tartrate  12.5 mg Per Tube BID   pantoprazole  40 mg Oral Daily   sodium chloride flush  10-40 mL Intracatheter Q12H   sodium chloride flush  3 mL Intravenous Q12H   Continuous Infusions:  sodium chloride     sodium chloride     sodium chloride 10 mL/hr at 09/30/19 1514   insulin Stopped (10/01/19 1438)   lactated ringers     lactated ringers     lactated ringers Stopped (10/02/19 0737)   nitroGLYCERIN Stopped (10/01/19 1402)   phenylephrine (NEO-SYNEPHRINE) Adult infusion Stopped (10/01/19 0702)   PRN Meds:.sodium chloride, dextrose, lactated ringers, metoprolol tartrate, morphine injection, ondansetron (ZOFRAN) IV, oxyCODONE, sodium chloride flush, sodium chloride flush, traMADol  General appearance: alert, cooperative and no distress Neurologic: intact Heart: RRR Lungs: Slightly diminished bibasilar breath sounds Abdomen: Soft, non tender, bowel sounds present Extremities: Bilateral LE edema. Motor/sensory intact Wound: Aquacel dressing intact on sternal wound (will be removed in am);LUE wound clean and dry.  Lab Results: CBC:  Recent Labs    10/01/19 1645 10/02/19 0522  WBC 19.2* 16.1*  HGB 10.5* 9.6*  HCT 32.5* 30.4*  PLT 161 154   BMET:  Recent Labs    10/01/19 1645 10/02/19 0522  NA 133* 135  K 4.6 4.5  CL 103 102  CO2 22 25  GLUCOSE 106* 103*  BUN 22 27*  CREATININE 1.21 1.19  CALCIUM 7.6* 7.9*    CMET: Lab Results  Component Value Date   WBC 16.1 (H) 10/02/2019   HGB 9.6 (L) 10/02/2019   HCT 30.4 (L) 10/02/2019   PLT 154 10/02/2019   GLUCOSE 103 (H) 10/02/2019   CHOL 216 (H) 09/28/2019   TRIG 94 09/28/2019   HDL 45 09/28/2019   LDLDIRECT 138.6 08/27/2011   LDLCALC 152 (H) 09/28/2019   ALT 12 06/22/2013   AST 21  06/22/2013   NA 135 10/02/2019   K 4.5 10/02/2019   CL 102 10/02/2019   CREATININE 1.19 10/02/2019   BUN 27 (H) 10/02/2019   CO2 25 10/02/2019   TSH 1.422 09/27/2019   PSA 0.44 06/22/2013   INR 1.3 (H) 09/30/2019   HGBA1C 5.9 (H) 09/27/2019      PT/INR:  Recent Labs    09/30/19 1530  LABPROT 15.8*  INR 1.3*   Radiology: No results found.   Assessment/Plan: S/P Procedure(s) (LRB): CORONARY ARTERY BYPASS GRAFTING (CABG) x 5 ON PUMP. LIMA TO LAD, RIMA TO OM1, RADIAL ARTERY SEQUENCED TO PDA AND OTHER, SVG TO DIAG 1 (N/A) RADIAL ARTERY HARVEST (Left) TRANSESOPHAGEAL ECHOCARDIOGRAM (TEE) (N/A) INDOCYANINE GREEN FLUORESCENCE IMAGING (ICG) (N/A) ENDOVEIN HARVEST OF GREATER SAPHENOUS VEIN (Right)   1. CV-SR, first degree heart block. On Plavix 75 mg daily, baby ec asa 81 mg daily, Isordil 10 mg tid, and Lopressor 12.5 mg bid. 2. Pulmonary-History of COPD. On 1 liter of oxygen via [place]. Chest tubes with 510 cc last 24 hours. No CXR ordered for this am so will order. Chest tubes to remain for today. Encourage incentive spirometer. 3. Volume overload-will give Lasix IV this am 4. Expected post op blood loss anemia-H and H this am slightly decreased to 9.6 and 30.4 5. Diet controlled DM-CBGs 107/103/106. Pre op HGA1C 5.9. Will need further surveillance of HGA1C as an outpatient 6. Hypothyroidism-continue Levothyroxine 150 mg daily. 7. Remove foley, right IJ transducer 8. Likely transfer to 4E  Donielle Liston Alba PA-C 10/02/2019 9:27 AM   Patient seen and examined, agree with above Will go ahead and dc chest tubes, central line and Foley transfer to [place]. Roxan Hockey, MD [facility] [phone]

## 2019-10-03 ENCOUNTER — Inpatient Hospital Stay (HOSPITAL_COMMUNITY): Payer: Medicare Other

## 2019-10-03 LAB — BASIC METABOLIC PANEL
Anion gap: 11 (ref 5–15)
BUN: 24 mg/dL — ABNORMAL HIGH (ref 8–23)
CO2: 24 mmol/L (ref 22–32)
Calcium: 8.1 mg/dL — ABNORMAL LOW (ref 8.9–10.3)
Chloride: 100 mmol/L (ref 98–111)
Creatinine, Ser: 1.04 mg/dL (ref 0.61–1.24)
GFR calc Af Amer: >60 mL/min (ref >60)
GFR calc non Af Amer: >60 mL/min (ref >60)
Glucose, Bld: 120 mg/dL — ABNORMAL HIGH (ref 70–99)
Potassium: 3.8 mmol/L (ref 3.5–5.1)
Sodium: 135 mmol/L (ref 135–145)

## 2019-10-03 LAB — CBC
HCT: 27.7 % — ABNORMAL LOW (ref 39.0–52.0)
Hemoglobin: 9.2 g/dL — ABNORMAL LOW (ref 13.0–17.0)
MCH: 30.6 pg (ref 26.0–34.0)
MCHC: 33.2 g/dL (ref 30.0–36.0)
MCV: 92 fL (ref 80.0–100.0)
Platelets: 174 10*3/uL (ref 150–400)
RBC: 3.01 MIL/uL — ABNORMAL LOW (ref 4.22–5.81)
RDW: 14.3 % (ref 11.5–15.5)
WBC: 14.1 10*3/uL — ABNORMAL HIGH (ref 4.0–10.5)
nRBC: 0 % (ref 0.0–0.2)

## 2019-10-03 MED ORDER — KETOROLAC TROMETHAMINE 15 MG/ML IJ SOLN: 15.0000 mg | Freq: Four times a day (QID) | INTRAMUSCULAR | Status: AC | PRN

## 2019-10-03 NOTE — Progress Notes (Signed)
Mobility Specialist - Progress Note   10/03/19 1301  Mobility  Activity Ambulated in hall  Level of Assistance Modified independent, requires aide device or extra time  Assistive Device Front wheel walker  Distance Ambulated (ft) 430 ft  Mobility Response Tolerated well  Mobility performed by Mobility specialist  $Mobility charge 1 Mobility    Pre-mobility: 91 HR, 97% SpO2 During mobility: 108 HR, 93% SpO2 Post-mobility: 98 HR, 95% SpO2  Pt states his midback pain flares w/ changes in position such as sitting/standing, saying it feels like a sharp pain and rated it an 8/10. His O2 stayed above 90% throughout ambulation.   Pricilla Handler Mobility Specialist Mobility Specialist Phone: [phone]

## 2019-10-03 NOTE — Progress Notes (Signed)
Pt ambulated x 470 feet with front wheel walker, pt tolerated well  

## 2019-10-03 NOTE — Progress Notes (Signed)
      [address]       [place] [address]             [phone]        3 Days Post-Op Procedure(s) (LRB): CORONARY ARTERY BYPASS GRAFTING (CABG) x 5 ON PUMP. LIMA TO LAD, RIMA TO OM1, RADIAL ARTERY SEQUENCED TO PDA AND OTHER, SVG TO DIAG 1 (N/A) RADIAL ARTERY HARVEST (Left) TRANSESOPHAGEAL ECHOCARDIOGRAM (TEE) (N/A) INDOCYANINE GREEN FLUORESCENCE IMAGING (ICG) (N/A) ENDOVEIN HARVEST OF GREATER SAPHENOUS VEIN (Right) Subjective: Awake and alert, in the bedside chair. Walked in the hall this morning. Primary complaint is back pain which is chronic but seems worse since surgery.  Tolerating PO's, no BM yet.  Objective: Vital signs in last 24 hours: Temp:  [98.1 F (36.7 C)-99.2 F (37.3 C)] 99 F (37.2 C) (08/07 1148) Pulse Rate:  [86-93] 91 (08/07 1148) Cardiac Rhythm: Normal sinus rhythm (08/07 0700) Resp:  [14-18] 18 (08/07 1148) BP: (96-126)/(60-67) 103/66 (08/07 1148) SpO2:  [87 %-95 %] 95 % (08/07 1148) Weight:  [105.6 kg] 105.6 kg (08/07 0509)    Intake/Output from previous day: 08/06 0701 - 08/07 0700 In: 813.3 [P.O.:480; I.V.:233.5; IV Piggyback:99.9] Out: 910 [Urine:800; Chest Tube:110] Intake/Output this shift: Total I/O In: 120 [P.O.:120] Out: 600 [Urine:600]  General appearance: alert, cooperative and mild distress Neurologic: intact Heart: RRR, Monitor review shows stable SR.  Lungs: clear to auscultation bilaterally Abdomen: Soft, NT. Extremities: Mild edema, left hand well perfused.  Wound: Sternal, left arm, and RLE EVH incision all intact and dry.   Lab Results: Recent Labs    10/02/19 0522 10/03/19 0300  WBC 16.1* 14.1*  HGB 9.6* 9.2*  HCT 30.4* 27.7*  PLT 154 174   BMET:  Recent Labs    10/02/19 0522 10/03/19 0300  NA 135 135  K 4.5 3.8  CL 102 100  CO2 25 24  GLUCOSE 103* 120*  BUN 27* 24*  CREATININE 1.19 1.04  CALCIUM 7.9* 8.1*    PT/INR:  Recent Labs    09/30/19 1530  LABPROT 15.8*  INR 1.3*    ABG    Component Value Date/Time   PHART 7.310 (L) 10/01/2019 0619   HCO3 22.5 10/01/2019 0619   TCO2 24 10/01/2019 0619   ACIDBASEDEF 4.0 (H) 10/01/2019 0619   O2SAT 88.0 10/01/2019 0619   CBG (last 3)  Recent Labs    10/02/19 0632 10/02/19 1118 10/02/19 1606  GLUCAP 106* 114* 130*    Assessment/Plan: S/P Procedure(s) (LRB): CORONARY ARTERY BYPASS GRAFTING (CABG) x 5 ON PUMP. LIMA TO LAD, RIMA TO OM1, RADIAL ARTERY SEQUENCED TO PDA AND OTHER, SVG TO DIAG 1 (N/A) RADIAL ARTERY HARVEST (Left) TRANSESOPHAGEAL ECHOCARDIOGRAM (TEE) (N/A) INDOCYANINE GREEN FLUORESCENCE IMAGING (ICG) (N/A) ENDOVEIN HARVEST OF GREATER SAPHENOUS VEIN (Right)  -POD3 CABG x 5, pre-op EF 50-60%. Making appropriate progress from surgical standpoint. Continue to encourage mobility.  Primary complaint is discomfort from chronic back issues.  Will offer K-pad and order Toradol for 48 hours. Monitor renal function.   -Volume excess- Wt ~5kg above pre-op. Continue diuresis with oral Lasix.  -Expected acute blood loss anemia- Hct 27.7, monitor.   -History of Type 2 DM- Glucose 103-120 past 24 hours.   -Hypothyroidism- Synthroid resumed    LOS: 5 days    Antony Odea, PA-C [phone] 10/03/2019

## 2019-10-03 NOTE — Progress Notes (Signed)
CARDIAC REHAB PHASE I   PRE:  Rate/Rhythm: 101 ST    BP: sitting 131/74    SaO2: 88-100 (patient holds breath secondary to chronic back pain)  MODE:  Ambulation: 470 ft   POST:  Rate/Rhythm: 101 ST    BP: sitting 128/74     SaO2: 95-99  1015-1106 Patient ambulated in hallway with RW. Slow steady gait noted. Patient c/o mid back pain since surgery. Recently give pain medication. Encouraged not to hold breath as saturations drop to 88%. Post ambulation patient to recliner with call bell and phone in reach. D/C education completed including IS, Risk factor reduction, sternal precautions, exercise guidelines, restrictions, and nutrition. Patient provided and reviewed [facility] booklet with RN. Interested in phase 2 CR at [facility]. With permission, referral placed. Patient encouraged to ambulate 2 more times today. Verbalized understanding.   Jeannifer Drakeford Minus Breeding RN, BSN

## 2019-10-04 NOTE — Progress Notes (Signed)
Mobility Specialist - Progress Note   10/04/19 1124  Mobility  Activity Ambulated in hall  Level of Assistance Modified independent, requires aide device or extra time  Assistive Device Front wheel walker  Distance Ambulated (ft) 480 ft  Mobility Response Tolerated well  Mobility performed by Mobility specialist  $Mobility charge 1 Mobility    Pre-mobility: 98 HR, 98% SpO2 During mobility: 106 HR, 93% SpO2 Post-mobility: 99 HR, 95% SpO2  Pt desaturated to 85% while ambulating, he endorsed feeling slightly out of breath. He was able to recover with a standing rest break and pursed lip breathing. He maintained ~93% afterwards.   Pricilla Handler Mobility Specialist Mobility Specialist Phone: [phone]

## 2019-10-04 NOTE — Progress Notes (Signed)
4 Days Post-Op Procedure(s) (LRB): CORONARY ARTERY BYPASS GRAFTING (CABG) x 5 ON PUMP. LIMA TO LAD, RIMA TO OM1, RADIAL ARTERY SEQUENCED TO PDA AND OTHER, SVG TO DIAG 1 (N/A) RADIAL ARTERY HARVEST (Left) TRANSESOPHAGEAL ECHOCARDIOGRAM (TEE) (N/A) INDOCYANINE GREEN FLUORESCENCE IMAGING (ICG) (N/A) ENDOVEIN HARVEST OF GREATER SAPHENOUS VEIN (Right) Subjective: No complaints, back pain is much better  Objective: Vital signs in last 24 hours: Temp:  [98.3 F (36.8 C)-99.8 F (37.7 C)] 98.3 F (36.8 C) (08/08 1130) Pulse Rate:  [85-108] 89 (08/08 1130) Cardiac Rhythm: Normal sinus rhythm (08/08 0833) Resp:  [17-20] 18 (08/08 1130) BP: (104-129)/(69-76) 104/69 (08/08 1130) SpO2:  [93 %-97 %] 94 % (08/08 1130) Weight:  [103.3 kg] 103.3 kg (08/08 0656)  Hemodynamic parameters for last 24 hours:    Intake/Output from previous day: 08/07 0701 - 08/08 0700 In: 600 [P.O.:600] Out: 1200 [Urine:1200] Intake/Output this shift: No intake/output data recorded.  General appearance: alert, cooperative and no distress Neurologic: intact Heart: regular rate and rhythm Lungs: diminished breath sounds bibasilar Abdomen: normal findings: soft, non-tender Extremities: edema trace Incisions clean, dry and intact  Lab Results: Recent Labs    10/02/19 0522 10/03/19 0300  WBC 16.1* 14.1*  HGB 9.6* 9.2*  HCT 30.4* 27.7*  PLT 154 174   BMET:  Recent Labs    10/02/19 0522 10/03/19 0300  NA 135 135  K 4.5 3.8  CL 102 100  CO2 25 24  GLUCOSE 103* 120*  BUN 27* 24*  CREATININE 1.19 1.04  CALCIUM 7.9* 8.1*    PT/INR: No results for input(s): LABPROT, INR in the last 72 hours. ABG    Component Value Date/Time   PHART 7.310 (L) 10/01/2019 0619   HCO3 22.5 10/01/2019 0619   TCO2 24 10/01/2019 0619   ACIDBASEDEF 4.0 (H) 10/01/2019 0619   O2SAT 88.0 10/01/2019 0619   CBG (last 3)  Recent Labs    10/02/19 0632 10/02/19 1118 10/02/19 1606  GLUCAP 106* 114* 130*     Assessment/Plan: S/P Procedure(s) (LRB): CORONARY ARTERY BYPASS GRAFTING (CABG) x 5 ON PUMP. LIMA TO LAD, RIMA TO OM1, RADIAL ARTERY SEQUENCED TO PDA AND OTHER, SVG TO DIAG 1 (N/A) RADIAL ARTERY HARVEST (Left) TRANSESOPHAGEAL ECHOCARDIOGRAM (TEE) (N/A) INDOCYANINE GREEN FLUORESCENCE IMAGING (ICG) (N/A) ENDOVEIN HARVEST OF GREATER SAPHENOUS VEIN (Right) -POD #4 Doing well In Sr- will dc pacing wires Ambulated 480 feet Back pain resolved Possibly home tomorrow   LOS: 6 days    Melrose Nakayama 10/04/2019

## 2019-10-04 NOTE — Progress Notes (Signed)
Pt ambulated x 470 feet around the unit, without assist pt tolerated well

## 2019-10-04 NOTE — Progress Notes (Signed)
EPWs removed per protocol, patient tolerated well.  Instructed on 1 hour of bedrest with monitoring.  Will continue to monitor.

## 2019-10-05 MED ORDER — ATORVASTATIN CALCIUM 10 MG PO TABS: 10.0000 mg | ORAL_TABLET | Freq: Every evening | ORAL | Status: DC

## 2019-10-05 MED ORDER — POTASSIUM CHLORIDE CRYS ER 20 MEQ PO TBCR: 20.0000 meq | EXTENDED_RELEASE_TABLET | Freq: Every day | ORAL | 0 refills | Status: DC

## 2019-10-05 MED ORDER — FUROSEMIDE 40 MG PO TABS: 40.0000 mg | ORAL_TABLET | Freq: Every day | ORAL | 0 refills | Status: DC

## 2019-10-05 MED ORDER — COLCHICINE 0.6 MG PO TABS: 0.6000 mg | ORAL_TABLET | Freq: Two times a day (BID) | ORAL | 0 refills | Status: DC

## 2019-10-05 MED ORDER — METOPROLOL TARTRATE 25 MG/10 ML ORAL SUSPENSION
25.0000 mg | Freq: Two times a day (BID) | ORAL | Status: DC
  Filled 2019-10-05 (×2): qty 10

## 2019-10-05 MED ORDER — ASPIRIN 81 MG PO TBEC: 81.0000 mg | DELAYED_RELEASE_TABLET | Freq: Every day | ORAL | 11 refills | Status: AC

## 2019-10-05 MED ORDER — OXYCODONE HCL 5 MG PO TABS: 5.0000 mg | ORAL_TABLET | ORAL | 0 refills | Status: DC | PRN

## 2019-10-05 MED ORDER — ATORVASTATIN CALCIUM 10 MG PO TABS: 20.0000 mg | ORAL_TABLET | Freq: Every evening | ORAL | Status: DC

## 2019-10-05 MED ORDER — ISOSORBIDE DINITRATE 10 MG PO TABS: 10.0000 mg | ORAL_TABLET | Freq: Three times a day (TID) | ORAL | 0 refills | Status: DC

## 2019-10-05 MED ORDER — ATORVASTATIN CALCIUM 20 MG PO TABS: 10.0000 mg | ORAL_TABLET | Freq: Every evening | ORAL | 1 refills | Status: AC

## 2019-10-05 MED ORDER — METOPROLOL TARTRATE 25 MG PO TABS
25.0000 mg | ORAL_TABLET | Freq: Two times a day (BID) | ORAL | Status: DC
  Administered 2019-10-05: 25 mg via ORAL
  Filled 2019-10-05: qty 1

## 2019-10-05 NOTE — Progress Notes (Addendum)
      [address]       [place] [address]             [phone]        5 Days Post-Op Procedure(s) (LRB): CORONARY ARTERY BYPASS GRAFTING (CABG) x 5 ON PUMP. LIMA TO LAD, RIMA TO OM1, RADIAL ARTERY SEQUENCED TO PDA AND OTHER, SVG TO DIAG 1 (N/A) RADIAL ARTERY HARVEST (Left) TRANSESOPHAGEAL ECHOCARDIOGRAM (TEE) (N/A) INDOCYANINE GREEN FLUORESCENCE IMAGING (ICG) (N/A) ENDOVEIN HARVEST OF GREATER SAPHENOUS VEIN (Right)  Subjective: Patient with loose stools yesterday. He has no specific complaint this am.  Objective: Vital signs in last 24 hours: Temp:  [98 F (36.7 C)-98.5 F (36.9 C)] 98 F (36.7 C) (08/09 0300) Pulse Rate:  [80-100] 90 (08/09 0455) Cardiac Rhythm: Normal sinus rhythm (08/08 1930) Resp:  [17-20] 20 (08/09 0455) BP: (104-143)/(69-81) 136/73 (08/09 0455) SpO2:  [91 %-98 %] 92 % (08/09 0455) Weight:  [102 kg] 102 kg (08/09 0455)  Pre op weight 99.2 kg Current Weight  10/05/19 102 kg       Intake/Output from previous day: 08/08 0701 - 08/09 0700 In: 240 [P.O.:240] Out: 700 [Urine:700]   Physical Exam:  Cardiovascular: RRR Pulmonary: Slightly diminished bibasilar breath sounds Abdomen: Soft, non tender, bowel sounds present. Extremities: Mild bilateral lower extremity edema. Wounds: Clean and dry.  No erythema or signs of infection.  Lab Results: CBC: Recent Labs    10/03/19 0300  WBC 14.1*  HGB 9.2*  HCT 27.7*  PLT 174   BMET:  Recent Labs    10/03/19 0300  NA 135  K 3.8  CL 100  CO2 24  GLUCOSE 120*  BUN 24*  CREATININE 1.04  CALCIUM 8.1*    PT/INR:  Lab Results  Component Value Date   INR 1.3 (H) 09/30/2019   INR 0.9 09/27/2019   ABG:  INR: Will add last result for INR, ABG once components are confirmed Will add last 4 CBG results once components are confirmed   Assessment/Plan: 1. CV-SR, first degree heart block. On Plavix 75 mg daily, baby ec asa 81 mg daily, Isordil 10 mg tid, and Lopressor  12.5 mg bid. Will increase Lopressor to 25 mg bid for better HR and BP control. 2. Pulmonary-History of COPD. On room air. Encourage incentive spirometer. 3. Volume overload-continue oral Lasix 40 mg daily 4. Expected post op blood loss anemia-Last H and H 9.2 and 27.7 5. Diet controlled DM-CBGs 106/114/130. Pre op HGA1C 5.9. Will need further surveillance of HGA1C as an outpatient 6. Hypothyroidism-continue Levothyroxine 150 mg daily. 7. Stop stool softeners 8. Start low dose Atorvastatin 9. Discharge  [name] Cadden M ZimmermanPA-C 10/05/2019,7:23 AM

## 2019-10-05 NOTE — Progress Notes (Signed)
D/c tele and IVs. Went over AVS with pt and all questions were answered.  ° °Jennilee Demarco S Nicola Heinemann, RN ° °

## 2019-10-05 NOTE — Progress Notes (Signed)
Brief education review. Pt receptive.  [place] CES, ACSM 9:36 AM 10/05/2019

## 2019-10-05 NOTE — Care Management Important Message (Signed)
Important Message  Patient Details  Name: [name] MRN: [id number] Date of Birth: 03-Oct-1950   Medicare Important Message Given:  Yes     Shelda Altes 10/05/2019, 10:11 AM

## 2019-10-05 NOTE — Progress Notes (Signed)
Pt ambulated x 470 feet around the unit independtly pt tolerated well

## 2019-10-05 NOTE — Progress Notes (Signed)
Progress Note  Patient Name: [name] Date of Encounter: 10/05/2019  Primary Cardiologist:   Dorris Carnes, MD  (New)   Subjective   Breathing OK.  No pain.   Inpatient Medications    Scheduled Meds: . acetaminophen  1,000 mg Oral Q6H   Or  . acetaminophen (TYLENOL) oral liquid 160 mg/5 mL  1,000 mg Per Tube Q6H  . aspirin EC  81 mg Oral Daily  . Chlorhexidine Gluconate Cloth  6 each Topical Daily  . clopidogrel  75 mg Oral Daily  . colchicine  0.6 mg Oral BID  . [facility]   Does not apply Once  . diazepam  2 mg Oral Q8H  . docusate sodium  200 mg Oral Daily  . furosemide  40 mg Oral Daily  . isosorbide dinitrate  10 mg Oral TID  . levothyroxine  150 mcg Oral Q0600  . lidocaine  1 patch Transdermal Q24H  . metoprolol tartrate  25 mg Oral BID   Or  . metoprolol tartrate  25 mg Per Tube BID  . pantoprazole  40 mg Oral Daily  . potassium chloride  20 mEq Oral BID  . sodium chloride flush  3 mL Intravenous Q12H   Continuous Infusions: . sodium chloride     PRN Meds: sodium chloride, alum & mag hydroxide-simeth, ketorolac, magnesium hydroxide, ondansetron (ZOFRAN) IV, oxyCODONE, sodium chloride flush   Vital Signs    Vitals:   10/04/19 2305 10/05/19 0300 10/05/19 0455 10/05/19 0739  BP: (!) 143/81 136/73 136/73 119/72  Pulse: 100 80 90 96  Resp: 19 19 20 15   Temp: 98.2 F (36.8 C) 98 F (36.7 C)  97.9 F (36.6 C)  TempSrc: Oral Oral  Oral  SpO2: 98% 91% 92% 95%  Weight:   102 kg   Height:        Intake/Output Summary (Last 24 hours) at 10/05/2019 0742 Last data filed at 10/04/2019 1700 Gross per 24 hour  Intake 240 ml  Output 700 ml  Net -460 ml   Filed Weights   10/03/19 0509 10/04/19 0656 10/05/19 0455  Weight: 105.6 kg 103.3 kg 102 kg    Telemetry    NSR - Personally Reviewed  ECG    NA - Personally Reviewed  Physical Exam   GEN: No acute distress.   Neck: No  JVD Cardiac: RRR, no murmurs,  rubs, or gallops.  Respiratory: Clear  to auscultation bilaterally. GI: Soft, nontender, non-distended  MS:   Mild leg and left forearm edema; No deformity. Neuro:  Nonfocal  Psych: Normal affect   Labs    Chemistry Recent Labs  Lab 10/01/19 1645 10/02/19 0522 10/03/19 0300  NA 133* 135 135  K 4.6 4.5 3.8  CL 103 102 100  CO2 22 25 24   GLUCOSE 106* 103* 120*  BUN 22 27* 24*  CREATININE 1.21 1.19 1.04  CALCIUM 7.6* 7.9* 8.1*  GFRNONAA >60 >60 >60  GFRAA >60 >60 >60  ANIONGAP 8 8 11      Hematology Recent Labs  Lab 10/01/19 1645 10/02/19 0522 10/03/19 0300  WBC 19.2* 16.1* 14.1*  RBC 3.45* 3.22* 3.01*  HGB 10.5* 9.6* 9.2*  HCT 32.5* 30.4* 27.7*  MCV 94.2 94.4 92.0  MCH 30.4 29.8 30.6  MCHC 32.3 31.6 33.2  RDW 14.6 14.6 14.3  PLT 161 154 174    Cardiac EnzymesNo results for input(s): TROPONINI in the last 168 hours. No results for input(s): TROPIPOC  in the last 168 hours.   BNPNo results for input(s): BNP, PROBNP in the last 168 hours.   DDimer No results for input(s): DDIMER in the last 168 hours.   Radiology    DG CHEST PORT 1 VIEW  Result Date: 10/03/2019 CLINICAL DATA:  Low lung volumes, concern for pneumothorax EXAM: PORTABLE CHEST 1 VIEW COMPARISON:  10/01/2019 chest radiograph. FINDINGS: Interval removal of bilateral chest tubes, Swan-Ganz catheter and mediastinal drain. Intact sternotomy wires. Stable cardiomediastinal silhouette with top-normal heart size. New tiny right apical pneumothorax, less than 5%. No left pneumothorax. Trace bilateral pleural effusions. No overt pulmonary edema. Bibasilar atelectasis is similar to mildly improved. IMPRESSION: 1. New tiny right apical pneumothorax, less than 5%. Interval removal of bilateral chest tubes. 2. Trace bilateral pleural effusions. 3. Bibasilar atelectasis, similar to mildly improved. Electronically Signed   By: Ilona Sorrel M.D.   On: 10/03/2019 10:02    Cardiac Studies   TEE  PRE-OP FINDINGS  Left  Ventricle: The left ventricle has normal systolic function, with an  ejection fraction of 55-60%. The cavity size was normal. There is mildly  increased left ventricular wall thickness.   Right Ventricle: The right ventricle has normal systolic function. The  cavity was normal. There is no increase in right ventricular wall  thickness.   Left Atrium: Left atrial size was normal in size.   Right Atrium: Right atrial size was grossly enlarged in size.   Interatrial Septum: No atrial level shunt detected by color flow Doppler.   Pericardium: There is no evidence of pericardial effusion.   Mitral Valve: The mitral valve is normal in structure. Mild thickening of  the mitral valve leaflet. Mitral valve regurgitation is mild by color flow  Doppler. There is no evidence of mitral valve vegetation.   Tricuspid Valve: The tricuspid valve was normal in structure. Tricuspid  valve regurgitation is trivial by color flow Doppler. There is no evidence  of tricuspid valve vegetation.   Aortic Valve: The aortic valve is tricuspid Aortic valve regurgitation was  not visualized by color flow Doppler. There is no evidence of aortic valve  stenosis. There is no evidence of a vegetation on the aortic valve.   Pulmonic Valve: The pulmonic valve was normal in structure.  Pulmonic valve regurgitation is trivial by color flow Doppler.    Aorta: There is evidence of atheroma immobile plaque in the descending  aorta; Grade III, measuring 3-25mm in size.   Patient Profile     69 y.o. male with pmh of CAD s/p stent placed on 2006 on 2 separate occasions, cath in 2007 with patent stents, nuclear stress test 2010 with no new ischemia and normal LVEF, HLD, HTN, diet controlled DM2, OSA not on CPAP, hypothyroidism and CVA in 08/2019 treated at [facility] who presented to the ER with chest pain.   Now status post CABG.    Assessment & Plan    CAD/CABG:   Plan per surgical team.     HTN:    BP labile but OK.    DM:    AIC this admission 5.9.  Continue previous therapy.     CVA RECENT:  Was admittedon ASA and Plavix.  TEE this admission witthout embolic source.  Had apparently a ZIO patch without atrial fib.  Needs follow up with neurology at [facility].  Continue ASA and Plavix at discharge.    SLEEP APNEA:  Does not use CPAP.    CARDIOLOGY RECOMMENDATIONS:  Discharge is anticipated in the next  48 hours. Recommendations for medications and follow up:  Discharge Medications: Continue medications as they are currently listed in the [facility]. Exceptions to the above:  Consider discontinue isosorbide.   Consider increased beta blocker dose at discharge with increased with BPs and HR ranging upper limits treatment goal.   Follow Up: The patient's Primary Cardiologist is Dorris Carnes, MD   Follow up in the office in 2 week(s).  Discharge has been arranged.   Signed,  Minus Breeding, MD  7:45 AM 10/05/2019  [facility]   For questions or updates, please contact [facility] Please consult [email or web address] for contact info under Cardiology/STEMI.   Signed, Minus Breeding, MD  10/05/2019, 7:42 AM

## 2019-10-06 NOTE — Telephone Encounter (Signed)
**Note De-Identified [name] Obfuscation** Patient contacted regarding discharge from [facility] on 10/05/2019.  Patient understands to follow up with provider Cecilie Kicks, NP on 10/22/2019 at 2:15 at [address], [place] in [place], [place] [address] and with [facility] on 10/12/2019 at 12:00. Patient understands discharge instructions? Yes Patient understands medications and regiment? Yes Patient understands to bring all medications to this visit? Yes  Ask patient:  Are you enrolled in My Chart: No and he states that he does not use the Internet.   Postop Surgical Patients:                What is your wound status? Good        Any signs/ symptoms of infection (Temp, redness/ red streaks, swelling, purulent drainage, foul odor or smell)? No .             Please do not place any creams/ lotions/ or antibiotic ointment on any surgical incisions/ wounds without physician approval. The pt has been advised and he verbalized understanding. .             Do you have any questions about your medications? None     All medications (except pain medications) are to be filled by your Cardiologist AFTER your first post op appointment with them.  Are you taking your pain medication? Only Tylenol currently .             How is your pain controlled? Well    Pain level? On a scale of 1 to 10 with 10 being the worst pain and 1 being the least he rates his pain at 4 without Tylenol and  2 with Tylenol. .             If you require a refill on pain medications, know that the same medication/ amount may not be prescribed or a refill may not be given.  Please contact your pharmacy for refill requests. The pt has been advised and he verbalized understanding. .             Do you have help at home with ADL's? Yes       Please refer to your Pre/post surgery booklet, there is a lot of useful information in it that may answer any questions you may have. The pt has been advised and he verbalized understanding. .             Please note that it is ok to remove  your surgical dressing, shower (soap/ water), and pat the incision dry. The pt has been advised and he verbalized understanding.  For surgery related questions staff will route a phone note to CV [facility] TOC pool. The pt had no surgery related questions.  [facility] [address], [place] [address]

## 2019-10-08 DIAGNOSIS — I639 Cerebral infarction, unspecified: Secondary | ICD-10-CM | POA: Diagnosis not present

## 2019-10-09 ENCOUNTER — Other Ambulatory Visit: Payer: Self-pay | Admitting: Cardiothoracic Surgery

## 2019-10-09 DIAGNOSIS — Z951 Presence of aortocoronary bypass graft: Secondary | ICD-10-CM

## 2019-10-12 ENCOUNTER — Ambulatory Visit (INDEPENDENT_AMBULATORY_CARE_PROVIDER_SITE_OTHER): Payer: Self-pay | Admitting: Cardiothoracic Surgery

## 2019-10-12 ENCOUNTER — Other Ambulatory Visit: Payer: Self-pay

## 2019-10-12 ENCOUNTER — Ambulatory Visit
Admission: RE | Admit: 2019-10-12 | Discharge: 2019-10-12 | Disposition: A | Discharge status: Home / Self Care | Payer: Medicare Other | Source: Ambulatory Visit | Attending: Cardiothoracic Surgery | Admitting: Cardiothoracic Surgery

## 2019-10-12 VITALS — BP 121/71 | HR 66 | Temp 97.6°F | Resp 20 | Ht 73.0 in | Wt 222.0 lb

## 2019-10-12 DIAGNOSIS — J90 Pleural effusion, not elsewhere classified: Secondary | ICD-10-CM | POA: Diagnosis not present

## 2019-10-12 DIAGNOSIS — J9811 Atelectasis: Secondary | ICD-10-CM | POA: Diagnosis not present

## 2019-10-12 DIAGNOSIS — Z951 Presence of aortocoronary bypass graft: Secondary | ICD-10-CM

## 2019-10-12 DIAGNOSIS — I2510 Atherosclerotic heart disease of native coronary artery without angina pectoris: Secondary | ICD-10-CM

## 2019-10-13 DIAGNOSIS — Z951 Presence of aortocoronary bypass graft: Secondary | ICD-10-CM | POA: Diagnosis not present

## 2019-10-13 DIAGNOSIS — I440 Atrioventricular block, first degree: Secondary | ICD-10-CM | POA: Diagnosis not present

## 2019-10-13 DIAGNOSIS — I2510 Atherosclerotic heart disease of native coronary artery without angina pectoris: Secondary | ICD-10-CM | POA: Diagnosis not present

## 2019-10-13 DIAGNOSIS — E785 Hyperlipidemia, unspecified: Secondary | ICD-10-CM | POA: Diagnosis not present

## 2019-10-13 DIAGNOSIS — I10 Essential (primary) hypertension: Secondary | ICD-10-CM | POA: Diagnosis not present

## 2019-10-13 NOTE — Progress Notes (Signed)
[address]       ,[place] [address]             [phone]     CARDIOTHORACIC SURGERY OFFICE NOTE  Referring Provider is Carlena Bjornstad, MD Primary Cardiologist is Dorris Carnes, MD PCP is [name], MD   HPI:  69 yo man s/p CABG on 09/30/19. Was discharged a few days after surgery with no outstanding issues. Presents for routine f/u. No complaints of chest pain or SOB.    Current Outpatient Medications  Medication Sig Dispense Refill  . aspirin 325 MG EC tablet Take 325 mg by mouth daily.    Marland Kitchen aspirin EC 81 MG EC tablet Take 1 tablet (81 mg total) by mouth daily. Swallow whole. 30 tablet 11  . atorvastatin (LIPITOR) 20 MG tablet Take 0.5 tablets (10 mg total) by mouth every evening. 30 tablet 1  . cholecalciferol (VITAMIN D3) 25 MCG (1000 UNIT) tablet Take 1,000 Units by mouth daily.    . Cholecalciferol 1.25 MG (50000 UT) capsule Take 50,000 Units by mouth daily.    . clopidogrel (PLAVIX) 75 MG tablet Take 75 mg by mouth daily.    Marland Kitchen co-enzyme Q-10 30 MG capsule Take 30 mg by mouth daily.    . Coenzyme Q10 (COQ10) 100 MG CAPS Take 1 capsule by mouth every other day.    . colchicine 0.6 MG tablet Take 1 tablet (0.6 mg total) by mouth 2 (two) times daily. 48 tablet 0  . fluticasone (FLONASE) 50 MCG/ACT nasal spray Place 1 spray into both nostrils daily. (Patient taking differently: Place 1 spray into both nostrils daily as needed for allergies. ) 16 g 3  . isosorbide dinitrate (ISORDIL) 10 MG tablet Take 1 tablet (10 mg total) by mouth 3 (three) times daily. For one month then stop. 90 tablet 0  . levothyroxine (SYNTHROID, LEVOTHROID) 150 MCG tablet TAKE 1 TABLET BY MOUTH ONCE DAILY 90 tablet 3  . metoprolol succinate (TOPROL-XL) 50 MG 24 hr tablet Take 50 mg by mouth daily.    . Multiple Vitamin (MULTIVITAMIN) capsule Take 1 capsule by mouth daily.     Marland Kitchen oxyCODONE (OXY IR/ROXICODONE) 5 MG immediate release tablet Take 1 tablet (5 mg total) by mouth  every 4 (four) hours as needed for severe pain. 28 tablet 0  . Saw Palmetto 450 MG CAPS Take by mouth daily.    Marland Kitchen testosterone cypionate (DEPOTESTOTERONE CYPIONATE) 100 MG/ML injection Inject into the muscle every 28 (twenty-eight) days. For IM use only    . vitamin C (ASCORBIC ACID) 250 MG tablet Take 250 mg by mouth daily.    . potassium chloride SA (KLOR-CON) 20 MEQ tablet Take 1 tablet (20 mEq total) by mouth daily. For 5 days then stop. (Patient not taking: Reported on 10/12/2019) 5 tablet 0   No current facility-administered medications for this visit.      Physical Exam:   BP 121/71   Pulse 66   Temp 97.6 F (36.4 C) (Skin)   Resp 20   Ht 6\' 1"  (1.854 m)   Wt 100.7 kg   SpO2 95% Comment: RA  BMI 29.29 kg/m   General:  Well-appearing, NAD  Chest:   cta  CV:   rrr  Incisions:  C/d/i  Abdomen:  sntnd  Extremities:  Mild edeam  Diagnostic Tests:  CXR with clear lung fields   Impression:  Doing well after CABG  Plan:  Doing well after  CABG Continue to observe sternal precautions; avoid driving Continue present medical regimen F/u in 2 weeks for repeat exam  I spent in excess of 20 minutes during the conduct of this office consultation and >50% of this time involved direct face-to-face encounter with the patient for counseling and/or coordination of their care.  Level 2                 10 minutes Level 3                 15 minutes Level 4                 25 minutes Level 5                 40 minutes  B. Murvin Natal, MD 10/13/2019 7:48 AM

## 2019-10-19 DIAGNOSIS — E291 Testicular hypofunction: Secondary | ICD-10-CM | POA: Diagnosis not present

## 2019-10-22 ENCOUNTER — Other Ambulatory Visit: Payer: Self-pay

## 2019-10-22 ENCOUNTER — Ambulatory Visit (INDEPENDENT_AMBULATORY_CARE_PROVIDER_SITE_OTHER): Payer: Medicare Other | Admitting: Cardiology

## 2019-10-22 ENCOUNTER — Encounter: Payer: Self-pay | Admitting: Cardiology

## 2019-10-22 VITALS — BP 120/62 | HR 96 | Ht 73.0 in | Wt 224.0 lb

## 2019-10-22 DIAGNOSIS — D538 Other specified nutritional anemias: Secondary | ICD-10-CM | POA: Diagnosis not present

## 2019-10-22 DIAGNOSIS — Z951 Presence of aortocoronary bypass graft: Secondary | ICD-10-CM

## 2019-10-22 DIAGNOSIS — I10 Essential (primary) hypertension: Secondary | ICD-10-CM | POA: Diagnosis not present

## 2019-10-22 DIAGNOSIS — Z79899 Other long term (current) drug therapy: Secondary | ICD-10-CM | POA: Diagnosis not present

## 2019-10-22 DIAGNOSIS — E782 Mixed hyperlipidemia: Secondary | ICD-10-CM

## 2019-10-22 DIAGNOSIS — I2510 Atherosclerotic heart disease of native coronary artery without angina pectoris: Secondary | ICD-10-CM | POA: Diagnosis not present

## 2019-10-22 NOTE — Progress Notes (Signed)
Cardiology Office Note   Date:  10/22/2019   ID:  [name], [name] [birth year], MRN [id number]  PCP:  [name], MD  Cardiologist:  Dr. Harrington Challenger     Chief Complaint  Patient presents with  . Hospitalization Follow-up    post CABG      History of Present Illness: [name] is a 69 y.o. male who presents for post hospitalization.  Was to see Dr. [name] prior to admit but since pt knows Dr. Harrington Challenger he is happy to see her.    Pt with  pmh of CAD s/p stent placed on 2006 on 2 separate occasions, cath in 2007 with patent stents, nuclear stress test 2010 with no new ischemia and normal LVEF, HLD, HTN, diet controlled DM2, OSA not on CPAP, hypothyroidism and CVA in 08/2019 treated at [facility]..  Pt admitted 09/27/19 with chest pain, progressive over 6 months.  This increased in week prior to admit.   troponins were neg.  Cath with patent stent to RCA and 80% instent restenosis in LAD stent and diffuse disease, CABG recommended.    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  Pt had CABG 09/30/19  X 5 LIMA to LAD, RIMA to OM1, radial artery sequenced to PDA  And PL and vg to diag.   Had F/u with Dr. [name] 10/12/19 was stable.    Today has incisional pain but controled with tylenol at night. No SOB.  Feels well. Discussed statins and he has had reaction to them and is not interested in [place].  BPis stable. Pt is eating heathy and walking 30 min twice per day. He has stopped colchicine he was not on it prior to surgery.   occ more aware of heart beat at night, no racing heart rate. He thinks he may have COVID 3 times, first in 2019 late in year.     Past Medical History:  Diagnosis Date  . Allergic rhinitis, cause unspecified   . Arthritis    "probably some form in my joints" (03/17/2013)  . Asthma    "a little" (03/17/2013)  . BMI 33.0-33.9,adult   . CAD (coronary artery disease)    DES 07/2004 /   DES LAD 09/2004  /  cath 08/2005 stents patent   /  nuclear 02/2008 no ischemia  . Chronic allergic rhinitis   . Chronic bronchitis ([place])    "used to get it q yr; haven't had it for the last couple years now" (03/17/2013)  . COPD (chronic obstructive pulmonary disease) ([place])    "a little" (03/17/2013)  . Dyslipidemia   . Ejection fraction    EF 55% cath, 08/2005  /  EF 60% nuclear, 02/2008  . Fibromyalgia   . GERD (gastroesophageal reflux disease)    "hx" (03/17/2013)  . Gout   . Hyperlipidemia   . Hypertension   . Hypothyroidism   . Myocardial infarction ([place]) 2006  . OSA on CPAP   . Osteoarthrosis, unspecified whether generalized or localized, unspecified site   . Other abnormal blood chemistry   . Overweight(278.02)   . Rotator cuff syndrome, right   . Stroke ([place])   . Type II diabetes mellitus ([place])     Past Surgical History:  Procedure Laterality Date  . arthroscopic shoulder surgery Left    rotator cuff repair with ligament reattachment  . CARDIAC CATHETERIZATION  ?2007  . CHOLECYSTECTOMY N/A 03/18/2013   Procedure: LAPAROSCOPIC CHOLECYSTECTOMY;  Surgeon: [name], MD;  Location: [facility];  Service: General;  Laterality: N/A;  . CORONARY ANGIOPLASTY WITH STENT PLACEMENT  2006 X2   "1 + 1" (03/17/2013)  . CORONARY ARTERY BYPASS GRAFT N/A 09/30/2019   Procedure: CORONARY ARTERY BYPASS GRAFTING (CABG) x 5 ON PUMP. LIMA TO LAD, RIMA TO OM1, RADIAL ARTERY SEQUENCED TO PDA AND OTHER, SVG TO DIAG 1;  Surgeon: [name], MD;  Location: [facility];  Service: Open Heart Surgery;  Laterality: N/A;  BILATERAL IMA  . ENDOVEIN HARVEST OF GREATER SAPHENOUS VEIN Right 09/30/2019   Procedure: ENDOVEIN HARVEST OF GREATER SAPHENOUS VEIN;  Surgeon: [name], MD;  Location: [facility];  Service: Open Heart Surgery;  Laterality: Right;  . LEFT HEART CATH AND CORONARY ANGIOGRAPHY N/A 09/28/2019   Procedure: LEFT HEART CATH AND CORONARY ANGIOGRAPHY;  Surgeon: [name], MD;  Location: [facility];  Service:  Cardiovascular;  Laterality: N/A;  . RADIAL ARTERY HARVEST Left 09/30/2019   Procedure: RADIAL ARTERY HARVEST;  Surgeon: [name], MD;  Location: [facility];  Service: Open Heart Surgery;  Laterality: Left;  . SHOULDER OPEN ROTATOR CUFF REPAIR Left ~ 2008  . TEE WITHOUT CARDIOVERSION N/A 09/30/2019   Procedure: TRANSESOPHAGEAL ECHOCARDIOGRAM (TEE);  Surgeon: [name], MD;  Location: [facility];  Service: Open Heart Surgery;  Laterality: N/A;     Current Outpatient Medications  Medication Sig Dispense Refill  . aspirin EC 81 MG EC tablet Take 1 tablet (81 mg total) by mouth daily. Swallow whole. 30 tablet 11  . atorvastatin (LIPITOR) 20 MG tablet Take 0.5 tablets (10 mg total) by mouth every evening. 30 tablet 1  . cholecalciferol (VITAMIN D3) 25 MCG (1000 UNIT) tablet Take 1,000 Units by mouth daily.    . Cholecalciferol 1.25 MG (50000 UT) capsule Take 50,000 Units by mouth daily.    . clopidogrel (PLAVIX) 75 MG tablet Take 75 mg by mouth daily.    Marland Kitchen co-enzyme Q-10 30 MG capsule Take 30 mg by mouth daily.    . Coenzyme Q10 (COQ10) 100 MG CAPS Take 1 capsule by mouth every other day.    . fluticasone (FLONASE) 50 MCG/ACT nasal spray Place 1 spray into both nostrils daily. (Patient taking differently: Place 1 spray into both nostrils daily as needed for allergies. ) 16 g 3  . isosorbide dinitrate (ISORDIL) 10 MG tablet Take 1 tablet (10 mg total) by mouth 3 (three) times daily. For one month then stop. 90 tablet 0  . levothyroxine (SYNTHROID, LEVOTHROID) 150 MCG tablet TAKE 1 TABLET BY MOUTH ONCE DAILY 90 tablet 3  . metoprolol succinate (TOPROL-XL) 50 MG 24 hr tablet Take 50 mg by mouth daily.    . Multiple Vitamin (MULTIVITAMIN) capsule Take 1 capsule by mouth daily.     Marland Kitchen oxyCODONE (OXY IR/ROXICODONE) 5 MG immediate release tablet Take 1 tablet (5 mg total) by mouth every 4 (four) hours as needed for severe pain. 28 tablet 0  . Saw Palmetto 450 MG CAPS Take by mouth daily.    Marland Kitchen  testosterone cypionate (DEPOTESTOTERONE CYPIONATE) 100 MG/ML injection Inject into the muscle every 28 (twenty-eight) days. For IM use only    . vitamin C (ASCORBIC ACID) 250 MG tablet Take 250 mg by mouth daily.    . potassium chloride SA (KLOR-CON) 20 MEQ tablet Take 1 tablet (20 mEq total) by mouth daily. For 5 days then stop. (Patient not taking: Reported on 10/12/2019) 5 tablet 0  No current facility-administered medications for this visit.    Allergies:   Ibuprofen    Social History:  The patient  reports that he quit smoking about 15 years ago. His smoking use included cigarettes. He has a 15.00 pack-year smoking history. He quit smokeless tobacco use about 15 years ago.  His smokeless tobacco use included chew. He reports current alcohol use of about 4.0 standard drinks of alcohol per week. He reports that he does not use drugs.   Family History:  The patient's family history includes Alcohol abuse in his father; CAD in his brother, father, and mother; Heart attack in his brother, father, and mother; Heart disease in his brother; Prostate cancer in his brother and father; Skin cancer in his father.    ROS:  General:no colds or fevers, some decrease in weight post op and loss of 30 lbs at least in last year after changing to some type of keto diet.    Skin:no rashes or ulcers HEENT:no blurred vision, no congestion CV:see HPI PUL:see HPI GI:no diarrhea constipation or melena, no indigestion GU:no hematuria, no dysuria MS:no joint pain, no claudication Neuro:no syncope, no lightheadedness Endo:+ diabetes, + thyroid disease  Wt Readings from Last 3 Encounters:  10/22/19 224 lb (101.6 kg)  10/12/19 222 lb (100.7 kg)  10/05/19 224 lb 13.9 oz (102 kg)     PHYSICAL EXAM: VS:  BP 120/62   Pulse 96   Ht 6\' 1"  (1.854 m)   Wt 224 lb (101.6 kg)   SpO2 97%   BMI 29.55 kg/m  , BMI Body mass index is 29.55 kg/m. General:Pleasant affect, NAD Skin:Warm and dry, brisk capillary  refill HEENT:normocephalic, sclera clear, mucus membranes moist Neck:supple, no JVD, no bruits  Heart:S1S2 RRR without murmur, gallup, rub or click Lungs:clear without rales, rhonchi, or wheezes WRU:EAVW, non tender, + BS, do not palpate liver spleen or masses Ext:no lower ext edema, 2+ pedal pulses, 2+ radial pulses Neuro:alert and oriented, MAE, follows commands, + facial symmetry    EKG:  EKG is ordered today. The ekg ordered today demonstrates SR at 85 and improved EKG from 10/04/19 with normalization of ST elevation.    Recent Labs: 09/27/2019: TSH 1.422 10/02/2019: Magnesium 2.6 10/03/2019: BUN 24; Creatinine, Ser 1.04; Hemoglobin 9.2; Platelets 174; Potassium 3.8; Sodium 135    Lipid Panel    Component Value Date/Time   CHOL 216 (H) 09/28/2019 0348   TRIG 94 09/28/2019 0348   HDL 45 09/28/2019 0348   CHOLHDL 4.8 09/28/2019 0348   VLDL 19 09/28/2019 0348   LDLCALC 152 (H) 09/28/2019 0348   LDLDIRECT 138.6 08/27/2011 1459       Other studies Reviewed: Additional studies/ records that were reviewed today include: . Echo 09/28/19 IMPRESSIONS    1. Left ventricular ejection fraction by 3D volume is 56 %. The left  ventricle has normal function. Left ventricular endocardial border not  optimally defined to detect subtle regional wall motion abnormalities,  however, wall motion appears grossly  normal. There is moderate left ventricular hypertrophy. Left ventricular  diastolic parameters were normal.  2. Right ventricular systolic function is normal. The right ventricular  size is normal. Tricuspid regurgitation signal is inadequate for assessing  PA pressure.  3. Left atrial size was mildly dilated.  4. The mitral valve is degenerative. Mild mitral valve regurgitation. No  evidence of mitral stenosis.  5. The aortic valve is tricuspid. Aortic valve regurgitation is trivial.  Mild aortic valve sclerosis is present, with no evidence  of aortic valve  stenosis.  6.  There is borderline dilatation of the ascending aorta measuring 39 mm.   FINDINGS  Left Ventricle: Left ventricular ejection fraction by 3D volume is 56 %.  The left ventricle has normal function. Left ventricular endocardial  border not optimally defined to evaluate regional wall motion. The left  ventricular internal cavity size was  normal in size. There is moderate left ventricular hypertrophy. Left  ventricular diastolic parameters were normal.   Right Ventricle: The right ventricular size is normal. No increase in  right ventricular wall thickness. Right ventricular systolic function is  normal. Tricuspid regurgitation signal is inadequate for assessing PA  pressure.   Left Atrium: Left atrial size was mildly dilated.   Right Atrium: Right atrial size was normal in size.   Pericardium: There is no evidence of pericardial effusion.   Mitral Valve: The mitral valve is degenerative in appearance. Normal  mobility of the mitral valve leaflets. Mild mitral valve regurgitation. No  evidence of mitral valve stenosis. MV peak gradient, 2.7 mmHg. The mean  mitral valve gradient is 1.0 mmHg.   Tricuspid Valve: The tricuspid valve is normal in structure. Tricuspid  valve regurgitation is trivial. No evidence of tricuspid stenosis.   Aortic Valve: The aortic valve is tricuspid. Aortic valve regurgitation is  trivial. Mild aortic valve sclerosis is present, with no evidence of  aortic valve stenosis. There is mild calcification of the aortic valve.  Aortic valve mean gradient measures  4.0 mmHg. Aortic valve peak gradient measures 6.1 mmHg. Aortic valve area,  by VTI measures 2.90 cm.   Pulmonic Valve: The pulmonic valve was normal in structure. Pulmonic valve  regurgitation is not visualized. No evidence of pulmonic stenosis.   Aorta: The aortic root is normal in size and structure. There is  borderline dilatation of the ascending aorta measuring 39 mm.   Venous: The inferior  vena cava was not well visualized.   IAS/Shunts: The interatrial septum was not well visualized.   Cardiac cath 09/28/19   Mid LAD lesion is 80% stenosed.  1st Diag lesion is 60% stenosed.  Ost LAD to Prox LAD lesion is 70% stenosed.  Prox Cx lesion is 70% stenosed.  Prox RCA lesion is 30% stenosed.  Mid RCA lesion is 80% stenosed.  Dist RCA lesion is 99% stenosed.  RPAV lesion is 90% stenosed.  RPDA lesion is 99% stenosed.   1. Severe triple vessel CAD 2. The LAD is a large vessel that courses to the apex. The proximal LAD has a severe stenosis leading into a large aneurysmal segment in the mid LAD. The aneurysmal segment arises at the takeoff of a moderate to large caliber Diagonal branch. The mid LAD stented segment has severe restenosis. The Diagonal branch is a moderate to large caliber bifurcating vessel with moderate proximal stenosis.  3. The Circumflex is a non-dominant moderate caliber vessel with severe proximal stenosis.  4. The RCA is a large dominant vessel with a patent proximal stent with minimal restenosis. The mid and distal vessel has severe disease with two focal lesions. The PDA is a moderate caliber vessel with severe proximal stenosis. The posterolateral artery has severe disease.   Recommendations: Will consult CT surgery for CABG. Plavix has been held. Will need Plavix washout prior to surgery. Continue ASA and statin  Carotid disease Lt and Rt 1-39%    Echo intraoperative. POST-OP IMPRESSIONS  Overall, there were no significant changes from pre-bypass.  - Left Ventricle: The left  ventricle is unchanged from pre-bypass.  - Right Ventricle: The right ventricle appears unchanged from pre-bypass.  - Aorta: The aorta appears unchanged from pre-bypass.  - Left Atrium: The left atrium appears unchanged from pre-bypass.  - Left Atrial Appendage: The left atrial appendage appears unchanged from  pre-bypass.  - Aortic Valve: The aortic valve appears unchanged  from pre-bypass.  - Mitral Valve: The mitral valve appears unchanged from pre-bypass.  - Tricuspid Valve: The tricuspid valve appears unchanged from pre-bypass.  - Interatrial Septum: The interatrial septum appears unchanged from  pre-bypass.  - Interventricular Septum: The interventricular septum appears unchanged  from  pre-bypass.  - Pericardium: The pericardium appears unchanged from pre-bypass.   PRE-OP FINDINGS  Left Ventricle: The left ventricle has normal systolic function, with an  ejection fraction of 55-60%. The cavity size was normal. There is mildly  increased left ventricular wall thickness.   Right Ventricle: The right ventricle has normal systolic function. The  cavity was normal. There is no increase in right ventricular wall  thickness.   Left Atrium: Left atrial size was normal in size.   Right Atrium: Right atrial size was grossly enlarged in size.   Interatrial Septum: No atrial level shunt detected by color flow Doppler.   Pericardium: There is no evidence of pericardial effusion.   Mitral Valve: The mitral valve is normal in structure. Mild thickening of  the mitral valve leaflet. Mitral valve regurgitation is mild by color flow  Doppler. There is no evidence of mitral valve vegetation.   Tricuspid Valve: The tricuspid valve was normal in structure. Tricuspid  valve regurgitation is trivial by color flow Doppler. There is no evidence  of tricuspid valve vegetation.   Aortic Valve: The aortic valve is tricuspid Aortic valve regurgitation was  not visualized by color flow Doppler. There is no evidence of aortic valve  stenosis. There is no evidence of a vegetation on the aortic valve.   Pulmonic Valve: The pulmonic valve was normal in structure.  Pulmonic valve regurgitation is trivial by color flow Doppler.    Aorta: There is evidence of atheroma immobile plaque in the descending  aorta; Grade III, measuring 3-19mm in size.   OP note  PROCEDURE:   Procedure(s) with comments: CORONARY ARTERY BYPASS GRAFTING (CABG) x 5 ON PUMP. LIMA TO LAD, RIMA TO OM1, RADIAL ARTERY SEQUENCED TO PDA AND OTHER, SVG TO DIAG 1 (N/A) - BILATERAL IMA RADIAL ARTERY HARVEST (Left) TRANSESOPHAGEAL ECHOCARDIOGRAM (TEE) (N/A) INDOCYANINE GREEN FLUORESCENCE IMAGING (ICG) (N/A) ENDOVEIN HARVEST OF GREATER SAPHENOUS VEIN (Right) LIMA-LAD RIMA-OM LEFT RADIAL - PD-PL SVG-DIAG  ASSESSMENT AND PLAN:  1.  CAD s/p CABG 09/30/19 X 5 see above but mostly arterial grafts.  Pt is stable some residual surgical pain.  He is walking twice a day for 30 min, sleeping well. No palpitations on ASA plavix, BB, will stop isosorbide in 30 days post discharge.  Follow up with surgery 10/26/19   He hopes to attend cardiac rehab  (hospital records reviewed)  2.  Blood loss anemia, will recheck CBC and BMP today   3.  HLD not on statin, has intolerance and is not interested in P[facility]K9 will need lipids checked in 3 months post recover.  LDL priro to surgery 152, goal in < 70, recorded in chart of lipito 10 mg but he tells me he is not taking.  4.  HX CVA in July.   5.  COPD now wheezes  6.  HTN controlled continue meds.  Follow up with Dr. Harrington Challenger in 3-4 months.   Current medicines are reviewed with the patient today.  The patient Has no concerns regarding medicines.  The following changes have been made:  See above Labs/ tests ordered today include:see above  Disposition:   FU:  see above  Signed, Cecilie Kicks, NP  10/22/2019 2:06 PM    [facility] [place], [place], [place], [place] Phone: [phone]; Fax: [phone]

## 2019-10-22 NOTE — Patient Instructions (Signed)
Medication Instructions:  Your physician recommends that you continue on your current medications as directed. Please refer to the Current Medication list given to you today.  *If you need a refill on your cardiac medications before your next appointment, please call your pharmacy*   Lab Work: TODAY:  BMET & CBC  If you have labs (blood work) drawn today and your tests are completely normal, you will receive your results only by:  [place] (if you have MyChart) OR  A paper copy in the mail If you have any lab test that is abnormal or we need to change your treatment, we will call you to review the results.   Testing/Procedures: None ordered   Follow-Up: At [facility], you and your health needs are our priority.  As part of our continuing mission to provide you with exceptional heart care, we have created designated Provider Care Teams.  These Care Teams include your primary Cardiologist (physician) and Advanced Practice Providers (APPs -  Physician Assistants and Nurse Practitioners) who all work together to provide you with the care you need, when you need it.  We recommend signing up for the patient portal called "MyChart".  Sign up information is provided on this After Visit Summary.  MyChart is used to connect with patients for Virtual Visits (Telemedicine).  Patients are able to view lab/test results, encounter notes, upcoming appointments, etc.  Non-urgent messages can be sent to your provider as well.   To learn more about what you can do with MyChart, go to [email or web address].    Your next appointment:   3-4  month(s)  The format for your next appointment:   In Person  Provider:   You may see Dorris Carnes, MD or one of the following Advanced Practice Providers on your designated Care Team:    Richardson Dopp, PA-C  Robbie Lis, [place]    Other Instructions

## 2019-10-23 ENCOUNTER — Telehealth: Payer: Self-pay | Admitting: Cardiology

## 2019-10-23 LAB — BASIC METABOLIC PANEL
BUN/Creatinine Ratio: 25 — ABNORMAL HIGH (ref 10–24)
BUN: 24 mg/dL (ref 8–27)
CO2: 24 mmol/L (ref 20–29)
Calcium: 9.9 mg/dL (ref 8.6–10.2)
Chloride: 103 mmol/L (ref 96–106)
Creatinine, Ser: 0.96 mg/dL (ref 0.76–1.27)
GFR calc Af Amer: 94 mL/min/{1.73_m2} (ref >59)
GFR calc non Af Amer: 81 mL/min/{1.73_m2} (ref >59)
Glucose: 104 mg/dL — ABNORMAL HIGH (ref 65–99)
Potassium: 4.5 mmol/L (ref 3.5–5.2)
Sodium: 142 mmol/L (ref 134–144)

## 2019-10-23 LAB — CBC
Hematocrit: 38.9 % (ref 37.5–51.0)
Hemoglobin: 12.9 g/dL — ABNORMAL LOW (ref 13.0–17.7)
MCH: 29.9 pg (ref 26.6–33.0)
MCHC: 33.2 g/dL (ref 31.5–35.7)
MCV: 90 fL (ref 79–97)
Platelets: 518 10*3/uL — ABNORMAL HIGH (ref 150–450)
RBC: 4.32 x10E6/uL (ref 4.14–5.80)
RDW: 13.6 % (ref 11.6–15.4)
WBC: 12.6 10*3/uL — ABNORMAL HIGH (ref 3.4–10.8)

## 2019-10-23 NOTE — Telephone Encounter (Signed)
Patient returning call for lab results. 

## 2019-10-23 NOTE — Telephone Encounter (Signed)
Patient aware of results.

## 2019-10-26 ENCOUNTER — Encounter: Payer: Self-pay | Admitting: Cardiothoracic Surgery

## 2019-10-26 ENCOUNTER — Ambulatory Visit (INDEPENDENT_AMBULATORY_CARE_PROVIDER_SITE_OTHER): Payer: Self-pay | Admitting: Cardiothoracic Surgery

## 2019-10-26 ENCOUNTER — Encounter: Payer: Self-pay | Admitting: *Deleted

## 2019-10-26 ENCOUNTER — Other Ambulatory Visit: Payer: Self-pay

## 2019-10-26 VITALS — BP 130/76 | HR 84 | Temp 98.7°F | Resp 18 | Ht 72.5 in | Wt 227.2 lb

## 2019-10-26 DIAGNOSIS — I2510 Atherosclerotic heart disease of native coronary artery without angina pectoris: Secondary | ICD-10-CM

## 2019-10-26 DIAGNOSIS — Z951 Presence of aortocoronary bypass graft: Secondary | ICD-10-CM

## 2019-10-26 NOTE — Progress Notes (Signed)
[address]       [place] [address]             [phone]     CARDIOTHORACIC SURGERY OFFICE NOTE  Referring Provider is Carlena Bjornstad, MD Primary Cardiologist is Dorris Carnes, MD PCP is [name], MD   HPI:  69 yo [name] s/p CABG on 09/30/19 presents for 2nd visit after surgery. No complaints; has been walking 30 min twice per day. Denies chest pain or SOB. States he's feeling better and better.    Current Outpatient Medications  Medication Sig Dispense Refill  . aspirin EC 81 MG EC tablet Take 1 tablet (81 mg total) by mouth daily. Swallow whole. 30 tablet 11  . atorvastatin (LIPITOR) 20 MG tablet Take 0.5 tablets (10 mg total) by mouth every evening. 30 tablet 1  . Cholecalciferol 1.25 MG (50000 UT) capsule Take 50,000 Units by mouth daily.    . clopidogrel (PLAVIX) 75 MG tablet Take 75 mg by mouth daily.    . Coenzyme Q10 (COQ10) 100 MG CAPS Take 1 capsule by mouth every other day.    . fluticasone (FLONASE) 50 MCG/ACT nasal spray Place 1 spray into both nostrils daily. (Patient taking differently: Place 1 spray into both nostrils daily as needed for allergies. ) 16 g 3  . isosorbide dinitrate (ISORDIL) 10 MG tablet Take 1 tablet (10 mg total) by mouth 3 (three) times daily. For one month then stop. 90 tablet 0  . levothyroxine (SYNTHROID, LEVOTHROID) 150 MCG tablet TAKE 1 TABLET BY MOUTH ONCE DAILY 90 tablet 3  . metoprolol succinate (TOPROL-XL) 50 MG 24 hr tablet Take 50 mg by mouth daily.    . Multiple Vitamin (MULTIVITAMIN) capsule Take 1 capsule by mouth daily.     . Saw Palmetto 450 MG CAPS Take by mouth daily.    Marland Kitchen testosterone cypionate (DEPOTESTOTERONE CYPIONATE) 100 MG/ML injection Inject into the muscle every 28 (twenty-eight) days. For IM use only    . vitamin C (ASCORBIC ACID) 250 MG tablet Take 250 mg by mouth daily.     No current facility-administered medications for this visit.      Physical Exam:   BP 130/76 (BP Location:  Right Arm, Patient Position: Sitting, Cuff Size: Normal)   Pulse 84   Temp 98.7 F (37.1 C)   Resp 18   Ht 6' 0.5" (1.842 m)   Wt 103.1 kg   SpO2 95% Comment: RA  BMI 30.39 kg/m   General:  Well-appearing  Chest:   cta  CV:   rrr  Incisions:  Well-healed  Abdomen:  sntnd  Extremities:  No edema  Diagnostic Tests:  No new tests   Impression:  Doing very well after CABG  Plan:  Ok to drive May liberalize activity with upper extremities F/U with thoracic surgery PRN  I spent in excess of 15 minutes during the conduct of this office consultation and >50% of this time involved direct face-to-face encounter with the patient for counseling and/or coordination of their care.  Level 2                 10 minutes Level 3                 15 minutes Level 4                 25 minutes Level 5  40 minutes  B. Murvin Natal, MD 10/26/2019 3:33 PM

## 2019-11-06 ENCOUNTER — Telehealth (HOSPITAL_COMMUNITY): Payer: Self-pay

## 2019-11-06 ENCOUNTER — Encounter (HOSPITAL_COMMUNITY): Payer: Self-pay

## 2019-11-06 NOTE — Telephone Encounter (Signed)
Attempted to call patient in regards to Cardiac Rehab - LM on VM Mailed letter 

## 2019-11-16 DIAGNOSIS — E291 Testicular hypofunction: Secondary | ICD-10-CM | POA: Diagnosis not present

## 2019-11-25 ENCOUNTER — Telehealth (HOSPITAL_COMMUNITY): Payer: Self-pay

## 2019-11-25 NOTE — Telephone Encounter (Signed)
No response from pt regarding CR.  Closed referral.  

## 2019-12-14 DIAGNOSIS — E291 Testicular hypofunction: Secondary | ICD-10-CM | POA: Diagnosis not present

## 2019-12-16 DIAGNOSIS — E291 Testicular hypofunction: Secondary | ICD-10-CM | POA: Diagnosis not present

## 2019-12-16 DIAGNOSIS — E785 Hyperlipidemia, unspecified: Secondary | ICD-10-CM | POA: Diagnosis not present

## 2019-12-16 DIAGNOSIS — I2510 Atherosclerotic heart disease of native coronary artery without angina pectoris: Secondary | ICD-10-CM | POA: Diagnosis not present

## 2019-12-16 DIAGNOSIS — E1169 Type 2 diabetes mellitus with other specified complication: Secondary | ICD-10-CM | POA: Diagnosis not present

## 2019-12-16 DIAGNOSIS — E039 Hypothyroidism, unspecified: Secondary | ICD-10-CM | POA: Diagnosis not present

## 2020-01-14 DIAGNOSIS — E291 Testicular hypofunction: Secondary | ICD-10-CM | POA: Diagnosis not present

## 2020-01-26 DIAGNOSIS — Z0000 Encounter for general adult medical examination without abnormal findings: Secondary | ICD-10-CM | POA: Diagnosis not present

## 2020-01-26 DIAGNOSIS — Z1331 Encounter for screening for depression: Secondary | ICD-10-CM | POA: Diagnosis not present

## 2020-01-26 DIAGNOSIS — Z9181 History of falling: Secondary | ICD-10-CM | POA: Diagnosis not present

## 2020-01-26 DIAGNOSIS — E785 Hyperlipidemia, unspecified: Secondary | ICD-10-CM | POA: Diagnosis not present

## 2020-02-15 DIAGNOSIS — E291 Testicular hypofunction: Secondary | ICD-10-CM | POA: Diagnosis not present

## 2020-03-06 NOTE — Progress Notes (Signed)
Cardiology Office Note   Date:  03/06/2020   ID:  DAVY DEBERG, DOB 22-Sep-1950, MRN [id number]  PCP:  [name], MD  Cardiologist:  Dr. Harrington Challenger     Pt presents for f/u of CAD    History of Present Illness: [name] is a 70 y.o. male with  History of CAD.  He is s/p stent placed on 2006 on 2 separate occasions.  Cath in 2007 with patent stents, nuclear stress test 2010 with no new ischemia and normal LVEF, Pt also with hx of HL, HTN, OSA not on CPAP, hypothyroidism and CVA in 08/2019 treated at [facility]. The pt was  admitted 09/27/19 with chest pain, progressive over 6 months.  This increased in week prior to admit.   Troponins were neg.  Cath showed patent stent to RCA and 80% instent restenosis in LAD stent and diffuse disease, CABG recommended.    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  Pt had CABG 09/30/19  X 5 LIMA to LAD, RIMA to OM1, radial artery sequenced to PDA  And PL and vg to diag.   Post d/c he was seen by [facility] ths past fall    Since seen he has done OK  His chest is still a little sore   Denies CP like angina   Breathing is OK   He is trying to lose wt, go down to eating 1 meal per day   He is not interested in meds for cholesterol      .     Past Medical History:  Diagnosis Date  . Allergic rhinitis, cause unspecified   . Arthritis    "probably some form in my joints" (03/17/2013)  . Asthma    "a little" (03/17/2013)  . BMI 33.0-33.9,adult   . CAD (coronary artery disease)    DES 07/2004 /   DES LAD 09/2004  /  cath 08/2005 stents patent  /  nuclear 02/2008 no ischemia  . Chronic allergic rhinitis   . Chronic bronchitis ([place])    "used to get it q yr; haven't had it for the last couple years now" (03/17/2013)  . COPD (chronic obstructive pulmonary disease) ([place])    "a little" (03/17/2013)  . Dyslipidemia   . Ejection fraction    EF 55% cath, 08/2005  /  EF 60% nuclear, 02/2008  . Fibromyalgia   . GERD  (gastroesophageal reflux disease)    "hx" (03/17/2013)  . Gout   . Hyperlipidemia   . Hypertension   . Hypothyroidism   . Myocardial infarction ([place]) 2006  . OSA on CPAP   . Osteoarthrosis, unspecified whether generalized or localized, unspecified site   . Other abnormal blood chemistry   . Overweight(278.02)   . Rotator cuff syndrome, right   . Stroke ([place])   . Type II diabetes mellitus ([place])     Past Surgical History:  Procedure Laterality Date  . arthroscopic shoulder surgery Left    rotator cuff repair with ligament reattachment  . CARDIAC CATHETERIZATION  ?2007  . CHOLECYSTECTOMY N/A 03/18/2013   Procedure: LAPAROSCOPIC CHOLECYSTECTOMY;  Surgeon: [name], MD;  Location: [place];  Service: General;  Laterality: N/A;  . CORONARY ANGIOPLASTY WITH STENT PLACEMENT  2006 X2   "1 + 1" (03/17/2013)  . CORONARY ARTERY BYPASS GRAFT N/A 09/30/2019   Procedure: CORONARY ARTERY BYPASS GRAFTING (CABG) x 5 ON PUMP. LIMA TO LAD, RIMA TO OM1,  RADIAL ARTERY SEQUENCED TO PDA AND OTHER, SVG TO DIAG 1;  Surgeon: [name], MD;  [facility];  Service: Open Heart Surgery;  Laterality: N/A;  BILATERAL IMA  . ENDOVEIN HARVEST OF GREATER SAPHENOUS VEIN Right 09/30/2019   Procedure: ENDOVEIN HARVEST OF GREATER SAPHENOUS VEIN;  Surgeon: [name], MD;  Location: [place];  Service: Open Heart Surgery;  Laterality: Right;  . LEFT HEART CATH AND CORONARY ANGIOGRAPHY N/A 09/28/2019   Procedure: LEFT HEART CATH AND CORONARY ANGIOGRAPHY;  Surgeon: [name], MD;  Location: [place] [facility];  Service: Cardiovascular;  Laterality: N/A;  . RADIAL ARTERY HARVEST Left 09/30/2019   Procedure: RADIAL ARTERY HARVEST;  Surgeon: [name], MD;  Location: [place];  Service: Open Heart Surgery;  Laterality: Left;  . SHOULDER OPEN ROTATOR CUFF REPAIR Left ~ 2008  . TEE WITHOUT CARDIOVERSION N/A 09/30/2019   Procedure: TRANSESOPHAGEAL ECHOCARDIOGRAM (TEE);  Surgeon: [name], MD;   Location: [place];  Service: Open Heart Surgery;  Laterality: N/A;     Current Outpatient Medications  Medication Sig Dispense Refill  . aspirin EC 81 MG EC tablet Take 1 tablet (81 mg total) by mouth daily. Swallow whole. 30 tablet 11  . atorvastatin (LIPITOR) 20 MG tablet Take 0.5 tablets (10 mg total) by mouth every evening. 30 tablet 1  . Cholecalciferol 1.25 MG (50000 UT) capsule Take 50,000 Units by mouth daily.    . clopidogrel (PLAVIX) 75 MG tablet Take 75 mg by mouth daily.    . Coenzyme Q10 (COQ10) 100 MG CAPS Take 1 capsule by mouth every other day.    . fluticasone (FLONASE) 50 MCG/ACT nasal spray Place 1 spray into both nostrils daily. (Patient taking differently: Place 1 spray into both nostrils daily as needed for allergies. ) 16 g 3  . isosorbide dinitrate (ISORDIL) 10 MG tablet Take 1 tablet (10 mg total) by mouth 3 (three) times daily. For one month then stop. 90 tablet 0  . levothyroxine (SYNTHROID, LEVOTHROID) 150 MCG tablet TAKE 1 TABLET BY MOUTH ONCE DAILY 90 tablet 3  . metoprolol succinate (TOPROL-XL) 50 MG 24 hr tablet Take 50 mg by mouth daily.    . Multiple Vitamin (MULTIVITAMIN) capsule Take 1 capsule by mouth daily.     . Saw Palmetto 450 MG CAPS Take by mouth daily.    Marland Kitchen testosterone cypionate (DEPOTESTOTERONE CYPIONATE) 100 MG/ML injection Inject into the muscle every 28 (twenty-eight) days. For IM use only    . vitamin C (ASCORBIC ACID) 250 MG tablet Take 250 mg by mouth daily.     No current facility-administered medications for this visit.    Allergies:   Ibuprofen    Social History:  The patient  reports that he quit smoking about 15 years ago. His smoking use included cigarettes. He has a 15.00 pack-year smoking history. He quit smokeless tobacco use about 15 years ago.  His smokeless tobacco use included chew. He reports current alcohol use of about 4.0 standard drinks of alcohol per week. He reports that he does not use drugs.   Family History:  The  patient's family history includes Alcohol abuse in his father; CAD in his brother, father, and mother; Heart attack in his brother, father, and mother; Heart disease in his brother; Prostate cancer in his brother and father; Skin cancer in his father.    ROS:  General:no colds or fevers, some decrease in weight post op and loss of 30 lbs at  least in last year after changing to some type of keto diet.    Skin:no rashes or ulcers HEENT:no blurred vision, no congestion CV:see HPI PUL:see HPI GI:no diarrhea constipation or melena, no indigestion GU:no hematuria, no dysuria MS:no joint pain, no claudication Neuro:no syncope, no lightheadedness Endo:+ diabetes, + thyroid disease  Wt Readings from Last 3 Encounters:  10/26/19 227 lb 3.7 oz (103.1 kg)  10/22/19 224 lb (101.6 kg)  10/12/19 222 lb (100.7 kg)     PHYSICAL EXAM: VS:  There were no vitals taken for this visit. , BMI There is no height or weight on file to calculate BMI. General:Pleasant affect, NAD Skin:Warm and dry, brisk capillary refill HEENT:normocephalic, sclera clear, mucus membranes moist Neck:supple, no JVD, no bruits  Heart:S1S2 RRR without murmur, gallup, rub or click Lungs:clear without rales, rhonchi, or wheezes [id number], non tender, + BS, do not palpate liver spleen or masses Ext:no lower ext edema, 2+ pedal pulses, 2+ radial pulses Neuro:alert and oriented, MAE, follows commands, + facial symmetry    EKG:  EKG is ordered today. The ekg ordered today demonstrates SR at 85 and improved EKG from 10/04/19 with normalization of ST elevation.    Recent Labs: 09/27/2019: TSH 1.422 10/02/2019: Magnesium 2.6 10/22/2019: BUN 24; Creatinine, Ser 0.96; Hemoglobin 12.9; Platelets 518; Potassium 4.5; Sodium 142    Lipid Panel    Component Value Date/Time   CHOL 216 (H) 09/28/2019 0348   TRIG 94 09/28/2019 0348   HDL 45 09/28/2019 0348   CHOLHDL 4.8 09/28/2019 0348   VLDL 19 09/28/2019 0348   LDLCALC 152 (H) 09/28/2019  0348   LDLDIRECT 138.6 08/27/2011 1459       Other studies Reviewed: Additional studies/ records that were reviewed today include: . Echo 09/28/19 IMPRESSIONS    1. Left ventricular ejection fraction by 3D volume is 56 %. The left  ventricle has normal function. Left ventricular endocardial border not  optimally defined to detect subtle regional wall motion abnormalities,  however, wall motion appears grossly  normal. There is moderate left ventricular hypertrophy. Left ventricular  diastolic parameters were normal.  2. Right ventricular systolic function is normal. The right ventricular  size is normal. Tricuspid regurgitation signal is inadequate for assessing  PA pressure.  3. Left atrial size was mildly dilated.  4. The mitral valve is degenerative. Mild mitral valve regurgitation. No  evidence of mitral stenosis.  5. The aortic valve is tricuspid. Aortic valve regurgitation is trivial.  Mild aortic valve sclerosis is present, with no evidence of aortic valve  stenosis.  6. There is borderline dilatation of the ascending aorta measuring 39 mm.   FINDINGS  Left Ventricle: Left ventricular ejection fraction by 3D volume is 56 %.  The left ventricle has normal function. Left ventricular endocardial  border not optimally defined to evaluate regional wall motion. The left  ventricular internal cavity size was  normal in size. There is moderate left ventricular hypertrophy. Left  ventricular diastolic parameters were normal.   Right Ventricle: The right ventricular size is normal. No increase in  right ventricular wall thickness. Right ventricular systolic function is  normal. Tricuspid regurgitation signal is inadequate for assessing PA  pressure.   Left Atrium: Left atrial size was mildly dilated.   Right Atrium: Right atrial size was normal in size.   Pericardium: There is no evidence of pericardial effusion.   Mitral Valve: The mitral valve is degenerative in  appearance. Normal  mobility of the mitral valve leaflets. Mild mitral  valve regurgitation. No  evidence of mitral valve stenosis. MV peak gradient, 2.7 mmHg. The mean  mitral valve gradient is 1.0 mmHg.   Tricuspid Valve: The tricuspid valve is normal in structure. Tricuspid  valve regurgitation is trivial. No evidence of tricuspid stenosis.   Aortic Valve: The aortic valve is tricuspid. Aortic valve regurgitation is  trivial. Mild aortic valve sclerosis is present, with no evidence of  aortic valve stenosis. There is mild calcification of the aortic valve.  Aortic valve mean gradient measures  4.0 mmHg. Aortic valve peak gradient measures 6.1 mmHg. Aortic valve area,  by VTI measures 2.90 cm.   Pulmonic Valve: The pulmonic valve was normal in structure. Pulmonic valve  regurgitation is not visualized. No evidence of pulmonic stenosis.   Aorta: The aortic root is normal in size and structure. There is  borderline dilatation of the ascending aorta measuring 39 mm.   Venous: The inferior vena cava was not well visualized.   IAS/Shunts: The interatrial septum was not well visualized.   Cardiac cath 09/28/19   Mid LAD lesion is 80% stenosed.  1st Diag lesion is 60% stenosed.  Ost LAD to Prox LAD lesion is 70% stenosed.  Prox Cx lesion is 70% stenosed.  Prox RCA lesion is 30% stenosed.  Mid RCA lesion is 80% stenosed.  Dist RCA lesion is 99% stenosed.  RPAV lesion is 90% stenosed.  RPDA lesion is 99% stenosed.   1. Severe triple vessel CAD 2. The LAD is a large vessel that courses to the apex. The proximal LAD has a severe stenosis leading into a large aneurysmal segment in the mid LAD. The aneurysmal segment arises at the takeoff of a moderate to large caliber Diagonal branch. The mid LAD stented segment has severe restenosis. The Diagonal branch is a moderate to large caliber bifurcating vessel with moderate proximal stenosis.  3. The Circumflex is a non-dominant moderate  caliber vessel with severe proximal stenosis.  4. The RCA is a large dominant vessel with a patent proximal stent with minimal restenosis. The mid and distal vessel has severe disease with two focal lesions. The PDA is a moderate caliber vessel with severe proximal stenosis. The posterolateral artery has severe disease.   Recommendations: Will consult CT surgery for CABG. Plavix has been held. Will need Plavix washout prior to surgery. Continue ASA and statin  Carotid disease Lt and Rt 1-39%    Echo intraoperative. POST-OP IMPRESSIONS  Overall, there were no significant changes from pre-bypass.  - Left Ventricle: The left ventricle is unchanged from pre-bypass.  - Right Ventricle: The right ventricle appears unchanged from pre-bypass.  - Aorta: The aorta appears unchanged from pre-bypass.  - Left Atrium: The left atrium appears unchanged from pre-bypass.  - Left Atrial Appendage: The left atrial appendage appears unchanged from  pre-bypass.  - Aortic Valve: The aortic valve appears unchanged from pre-bypass.  - Mitral Valve: The mitral valve appears unchanged from pre-bypass.  - Tricuspid Valve: The tricuspid valve appears unchanged from pre-bypass.  - Interatrial Septum: The interatrial septum appears unchanged from  pre-bypass.  - Interventricular Septum: The interventricular septum appears unchanged  from  pre-bypass.  - Pericardium: The pericardium appears unchanged from pre-bypass.   PRE-OP FINDINGS  Left Ventricle: The left ventricle has normal systolic function, with an  ejection fraction of 55-60%. The cavity size was normal. There is mildly  increased left ventricular wall thickness.   Right Ventricle: The right ventricle has normal systolic function. The  cavity was  normal. There is no increase in right ventricular wall  thickness.   Left Atrium: Left atrial size was normal in size.   Right Atrium: Right atrial size was grossly enlarged in size.   Interatrial  Septum: No atrial level shunt detected by color flow Doppler.   Pericardium: There is no evidence of pericardial effusion.   Mitral Valve: The mitral valve is normal in structure. Mild thickening of  the mitral valve leaflet. Mitral valve regurgitation is mild by color flow  Doppler. There is no evidence of mitral valve vegetation.   Tricuspid Valve: The tricuspid valve was normal in structure. Tricuspid  valve regurgitation is trivial by color flow Doppler. There is no evidence  of tricuspid valve vegetation.   Aortic Valve: The aortic valve is tricuspid Aortic valve regurgitation was  not visualized by color flow Doppler. There is no evidence of aortic valve  stenosis. There is no evidence of a vegetation on the aortic valve.   Pulmonic Valve: The pulmonic valve was normal in structure.  Pulmonic valve regurgitation is trivial by color flow Doppler.    Aorta: There is evidence of atheroma immobile plaque in the descending  aorta; Grade III, measuring 3-17mm in size.   OP note  PROCEDURE:  Procedure(s) with comments: CORONARY ARTERY BYPASS GRAFTING (CABG) x 5 ON PUMP. LIMA TO LAD, RIMA TO OM1, RADIAL ARTERY SEQUENCED TO PDA AND OTHER, SVG TO DIAG 1 (N/A) - BILATERAL IMA RADIAL ARTERY HARVEST (Left) TRANSESOPHAGEAL ECHOCARDIOGRAM (TEE) (N/A) INDOCYANINE GREEN FLUORESCENCE IMAGING (ICG) (N/A) ENDOVEIN HARVEST OF GREATER SAPHENOUS VEIN (Right) LIMA-LAD RIMA-OM LEFT RADIAL - PD-PL SVG-DIAG  ASSESSMENT AND PLAN:  1.  CAD s/p CABG Aug 2021 X 5 as noted above   Doing well   Keep active   2  HL   The pt's LDL was 148  HDL 63   He refuses meds for this    Discussed diet    Veggies, low carbs  4.  HX CVA in   4  HTN  Controlled   Continue to follow   5  Hx of COPD  Moving air well  6   Wt   Discussed diet/ wt loss     F/U mid summer .   Current medicines are reviewed with the patient today.  The patient Has no concerns regarding medicines.  The following changes have  been made:  See above Labs/ tests ordered today include:see above  Disposition:   FU:  see above  Signed, Dorris Carnes, MD  03/06/2020 10:56 PM    [facility] [place], [place], [place], [place] Phone: [phone]; Fax: [phone]

## 2020-03-07 ENCOUNTER — Encounter: Payer: Self-pay | Admitting: Internal Medicine

## 2020-03-07 ENCOUNTER — Ambulatory Visit (INDEPENDENT_AMBULATORY_CARE_PROVIDER_SITE_OTHER): Payer: Medicare Other | Admitting: Internal Medicine

## 2020-03-07 ENCOUNTER — Other Ambulatory Visit: Payer: Self-pay

## 2020-03-07 VITALS — BP 132/70 | HR 78 | Ht 72.0 in | Wt 242.0 lb

## 2020-03-07 DIAGNOSIS — I2510 Atherosclerotic heart disease of native coronary artery without angina pectoris: Secondary | ICD-10-CM

## 2020-03-07 NOTE — Patient Instructions (Signed)
Medication Instructions:  No changes *If you need a refill on your cardiac medications before your next appointment, please call your pharmacy*   Lab Work: none If you have labs (blood work) drawn today and your tests are completely normal, you will receive your results only by: . MyChart Message (if you have MyChart) OR . A paper copy in the mail If you have any lab test that is abnormal or we need to change your treatment, we will call you to review the results.   Testing/Procedures: none   Follow-Up: At [facility], you and your health needs are our priority.  As part of our continuing mission to provide you with exceptional heart care, we have created designated Provider Care Teams.  These Care Teams include your primary Cardiologist (physician) and Advanced Practice Providers (APPs -  Physician Assistants and Nurse Practitioners) who all work together to provide you with the care you need, when you need it.  We recommend signing up for the patient portal called "MyChart".  Sign up information is provided on this After Visit Summary.  MyChart is used to connect with patients for Virtual Visits (Telemedicine).  Patients are able to view lab/test results, encounter notes, upcoming appointments, etc.  Non-urgent messages can be sent to your provider as well.   To learn more about what you can do with MyChart, go to [email or web address].    Your next appointment:   6 month(s)  The format for your next appointment:   In Person  Provider:   You may see Paula Ross, MD  or one of the following Advanced Practice Providers on your designated Care Team:    Scott Weaver, PA-C  Vin Bhagat, PA-C    Other Instructions   

## 2020-03-17 DIAGNOSIS — E039 Hypothyroidism, unspecified: Secondary | ICD-10-CM | POA: Diagnosis not present

## 2020-03-17 DIAGNOSIS — E785 Hyperlipidemia, unspecified: Secondary | ICD-10-CM | POA: Diagnosis not present

## 2020-03-17 DIAGNOSIS — E291 Testicular hypofunction: Secondary | ICD-10-CM | POA: Diagnosis not present

## 2020-03-17 DIAGNOSIS — E1169 Type 2 diabetes mellitus with other specified complication: Secondary | ICD-10-CM | POA: Diagnosis not present

## 2020-03-17 DIAGNOSIS — I2510 Atherosclerotic heart disease of native coronary artery without angina pectoris: Secondary | ICD-10-CM | POA: Diagnosis not present

## 2020-04-14 DIAGNOSIS — E291 Testicular hypofunction: Secondary | ICD-10-CM | POA: Diagnosis not present

## 2020-04-25 DIAGNOSIS — E039 Hypothyroidism, unspecified: Secondary | ICD-10-CM | POA: Diagnosis not present

## 2020-05-12 DIAGNOSIS — E291 Testicular hypofunction: Secondary | ICD-10-CM | POA: Diagnosis not present

## 2020-06-09 DIAGNOSIS — E291 Testicular hypofunction: Secondary | ICD-10-CM | POA: Diagnosis not present

## 2020-06-15 DIAGNOSIS — E039 Hypothyroidism, unspecified: Secondary | ICD-10-CM | POA: Diagnosis not present

## 2020-06-15 DIAGNOSIS — E1169 Type 2 diabetes mellitus with other specified complication: Secondary | ICD-10-CM | POA: Diagnosis not present

## 2020-06-15 DIAGNOSIS — E785 Hyperlipidemia, unspecified: Secondary | ICD-10-CM | POA: Diagnosis not present

## 2020-06-15 DIAGNOSIS — I2510 Atherosclerotic heart disease of native coronary artery without angina pectoris: Secondary | ICD-10-CM | POA: Diagnosis not present

## 2020-06-15 DIAGNOSIS — E291 Testicular hypofunction: Secondary | ICD-10-CM | POA: Diagnosis not present

## 2020-07-07 DIAGNOSIS — E291 Testicular hypofunction: Secondary | ICD-10-CM | POA: Diagnosis not present

## 2020-08-02 DIAGNOSIS — H35372 Puckering of macula, left eye: Secondary | ICD-10-CM | POA: Diagnosis not present

## 2020-08-02 DIAGNOSIS — H2513 Age-related nuclear cataract, bilateral: Secondary | ICD-10-CM | POA: Diagnosis not present

## 2020-08-02 DIAGNOSIS — H35341 Macular cyst, hole, or pseudohole, right eye: Secondary | ICD-10-CM | POA: Diagnosis not present

## 2020-08-02 DIAGNOSIS — H43819 Vitreous degeneration, unspecified eye: Secondary | ICD-10-CM | POA: Diagnosis not present

## 2020-08-04 DIAGNOSIS — E291 Testicular hypofunction: Secondary | ICD-10-CM | POA: Diagnosis not present

## 2020-08-16 IMAGING — DX DG CHEST 1V PORT
1 series · 1 of 1 positions shown · non-contrast
Comparison: 10/01/2019 chest radiograph.

CLINICAL DATA: Low lung volumes, concern for pneumothorax

EXAM:
PORTABLE CHEST 1 VIEW

[chest]
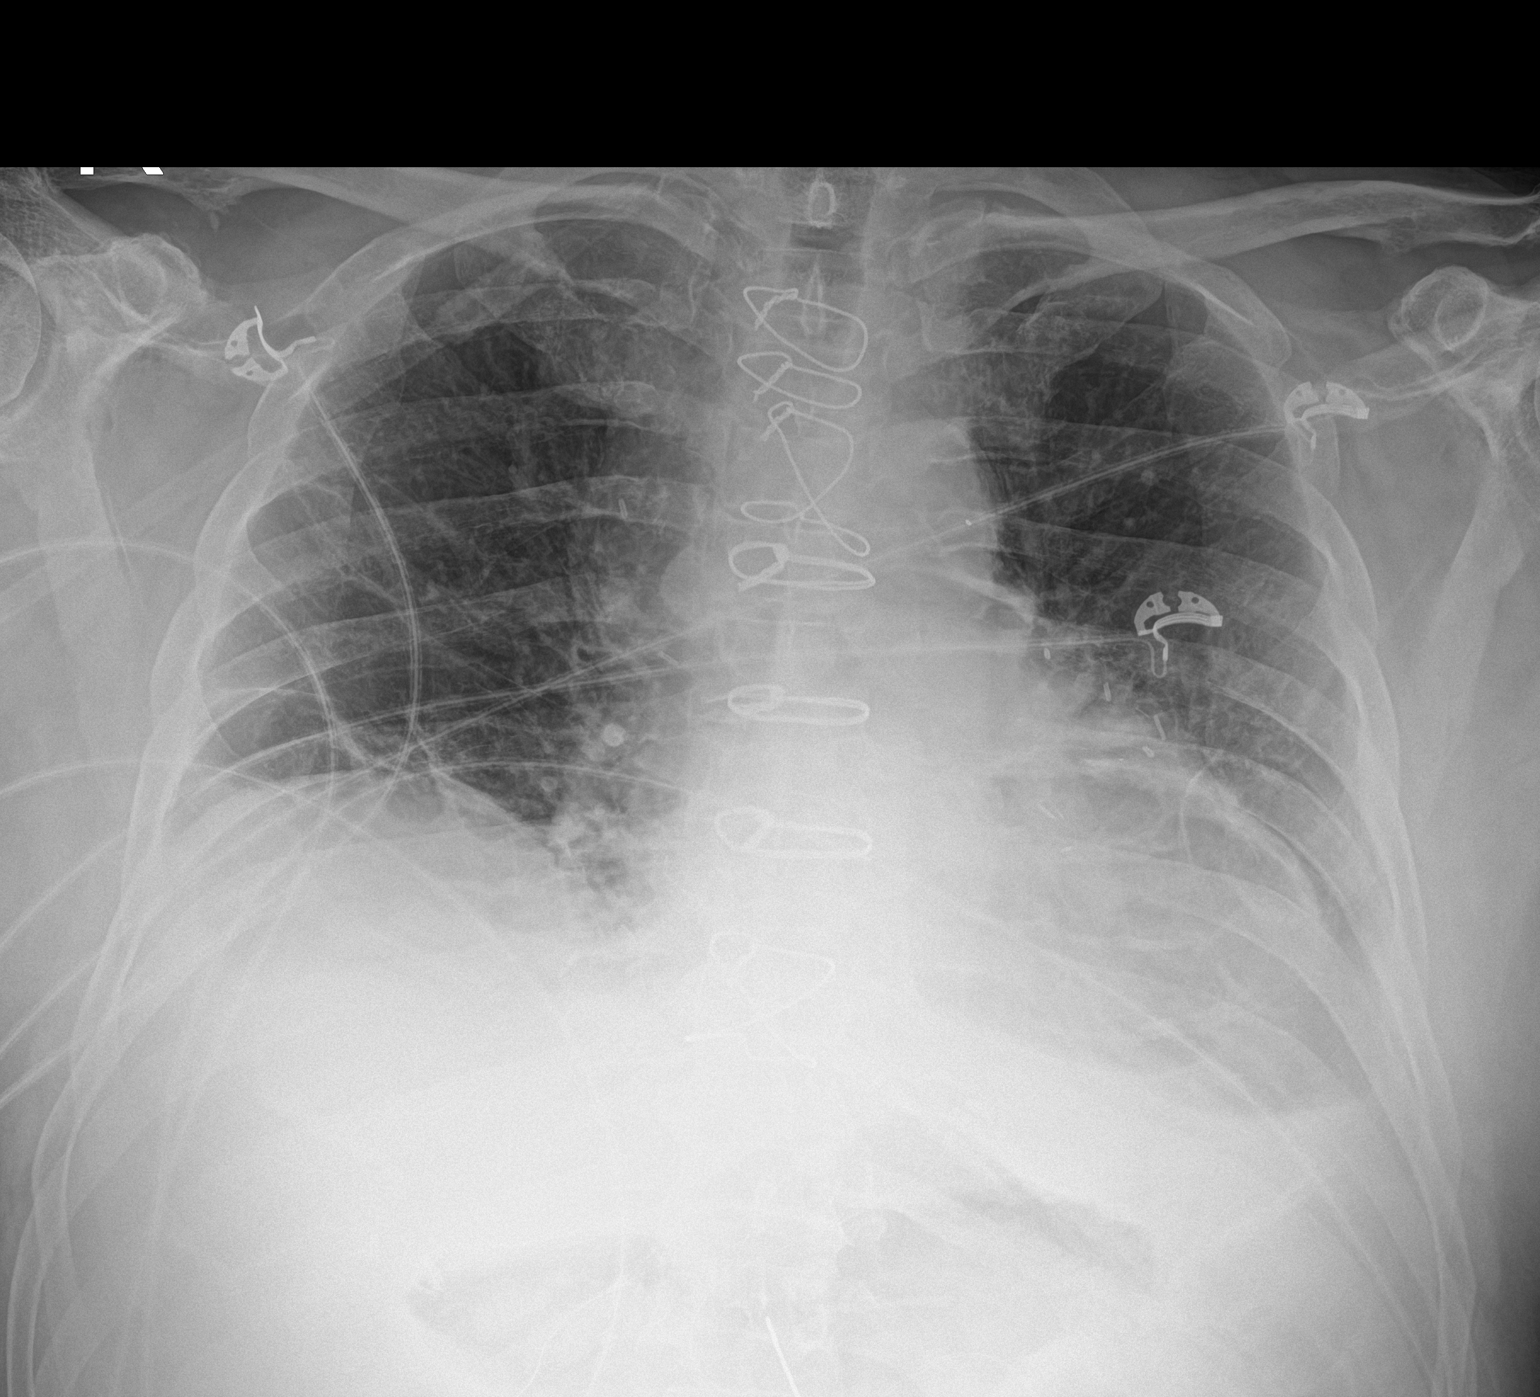

[1 of 1 positions shown; findings below may reference images not displayed]

FINDINGS: Interval removal of bilateral chest tubes, Swan-Ganz catheter and
mediastinal drain. Intact sternotomy wires. Stable cardiomediastinal
silhouette with top-normal heart size. New tiny right apical
pneumothorax, less than 5%. No left pneumothorax. Trace bilateral
pleural effusions. No overt pulmonary edema. Bibasilar atelectasis
is similar to mildly improved.
IMPRESSION: 1. New tiny right apical pneumothorax, less than 5%. Interval
removal of bilateral chest tubes.
2. Trace bilateral pleural effusions.
3. Bibasilar atelectasis, similar to mildly improved.

## 2020-08-25 IMAGING — CR DG CHEST 2V
2 series · 2 of 2 positions shown · non-contrast
Comparison: 10/03/2019

CLINICAL DATA: Per order- S/P CABG x 5 Pt had surgery on
09/30/2019s/p cabg x 5

EXAM:
CHEST - 2 VIEW

[w chest pa]
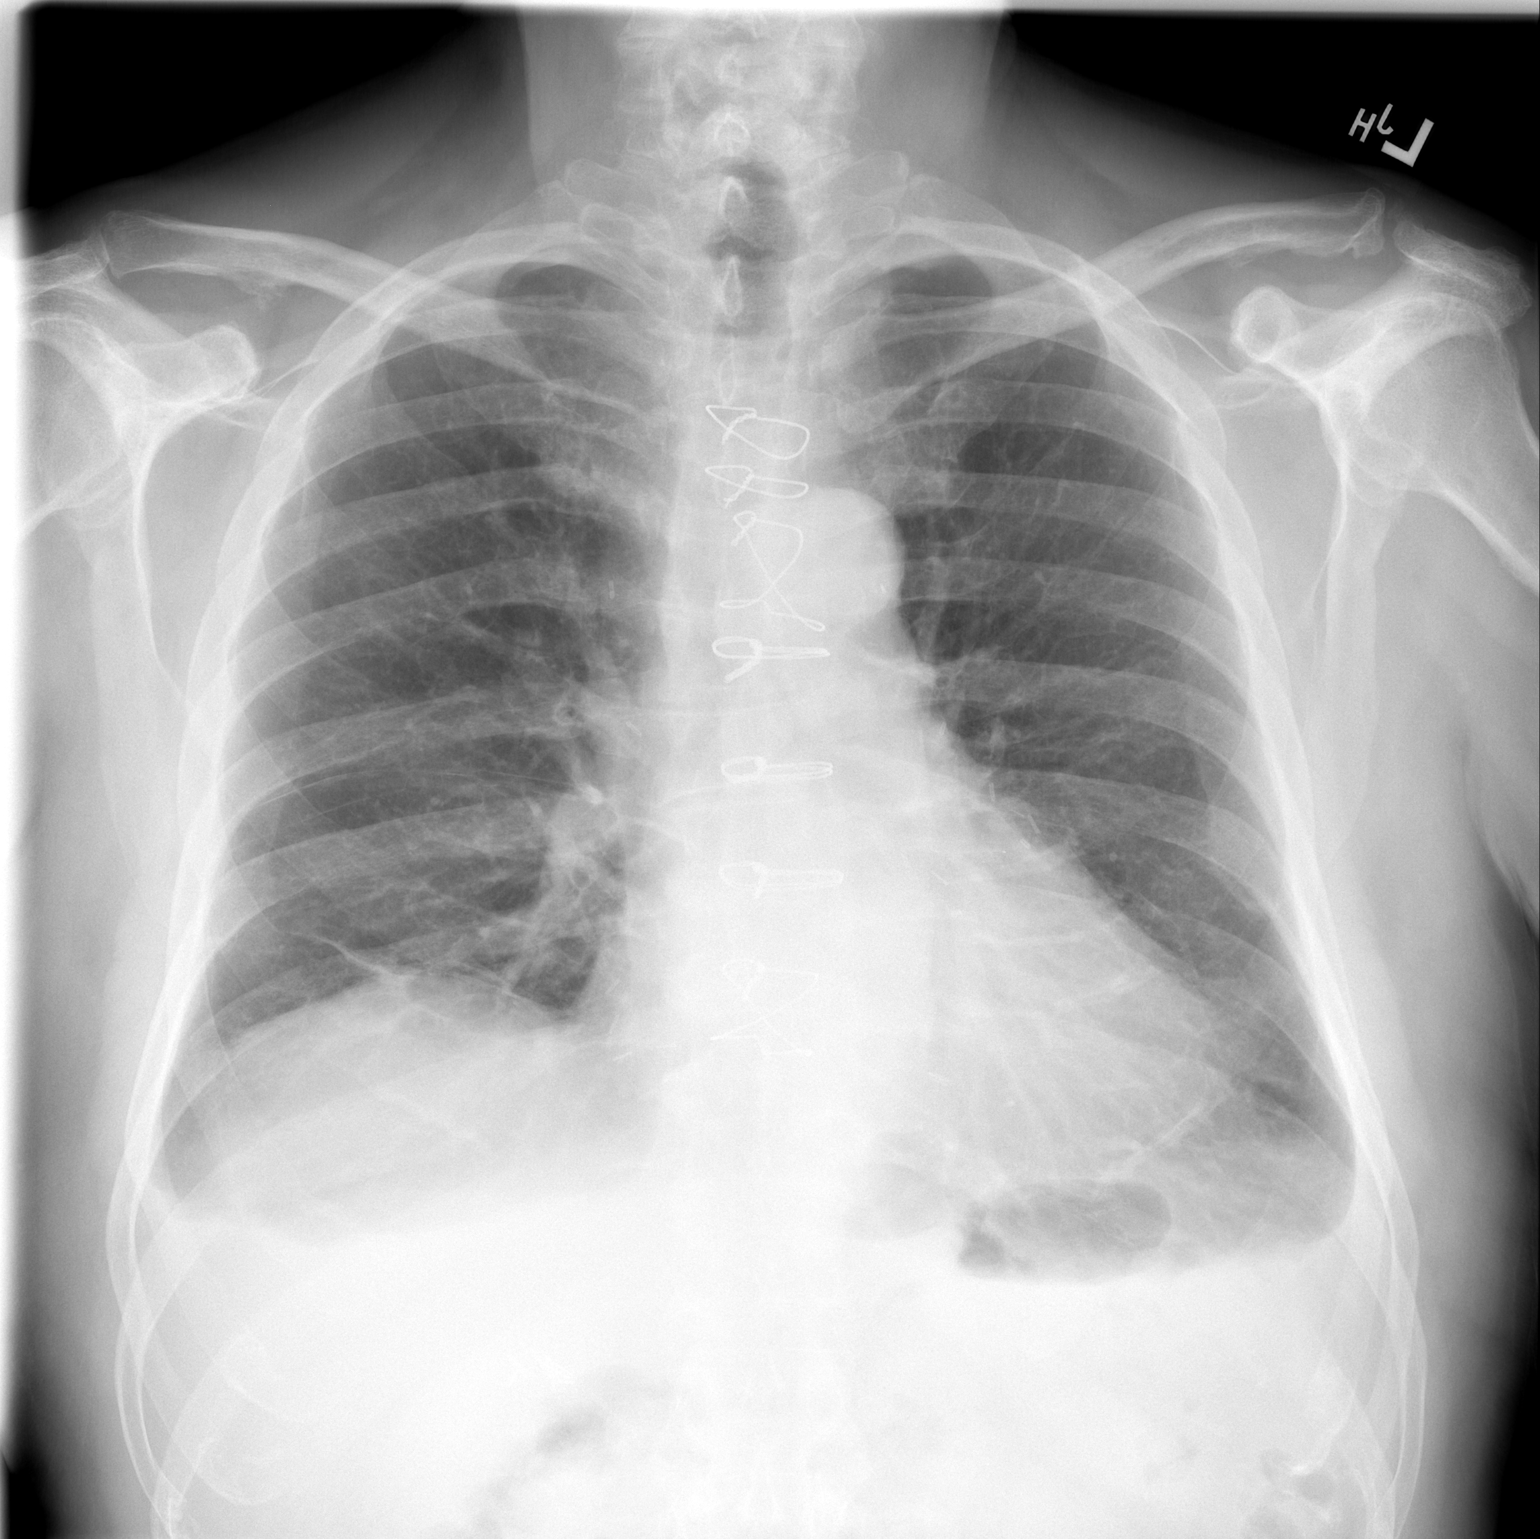

[w chest lat]
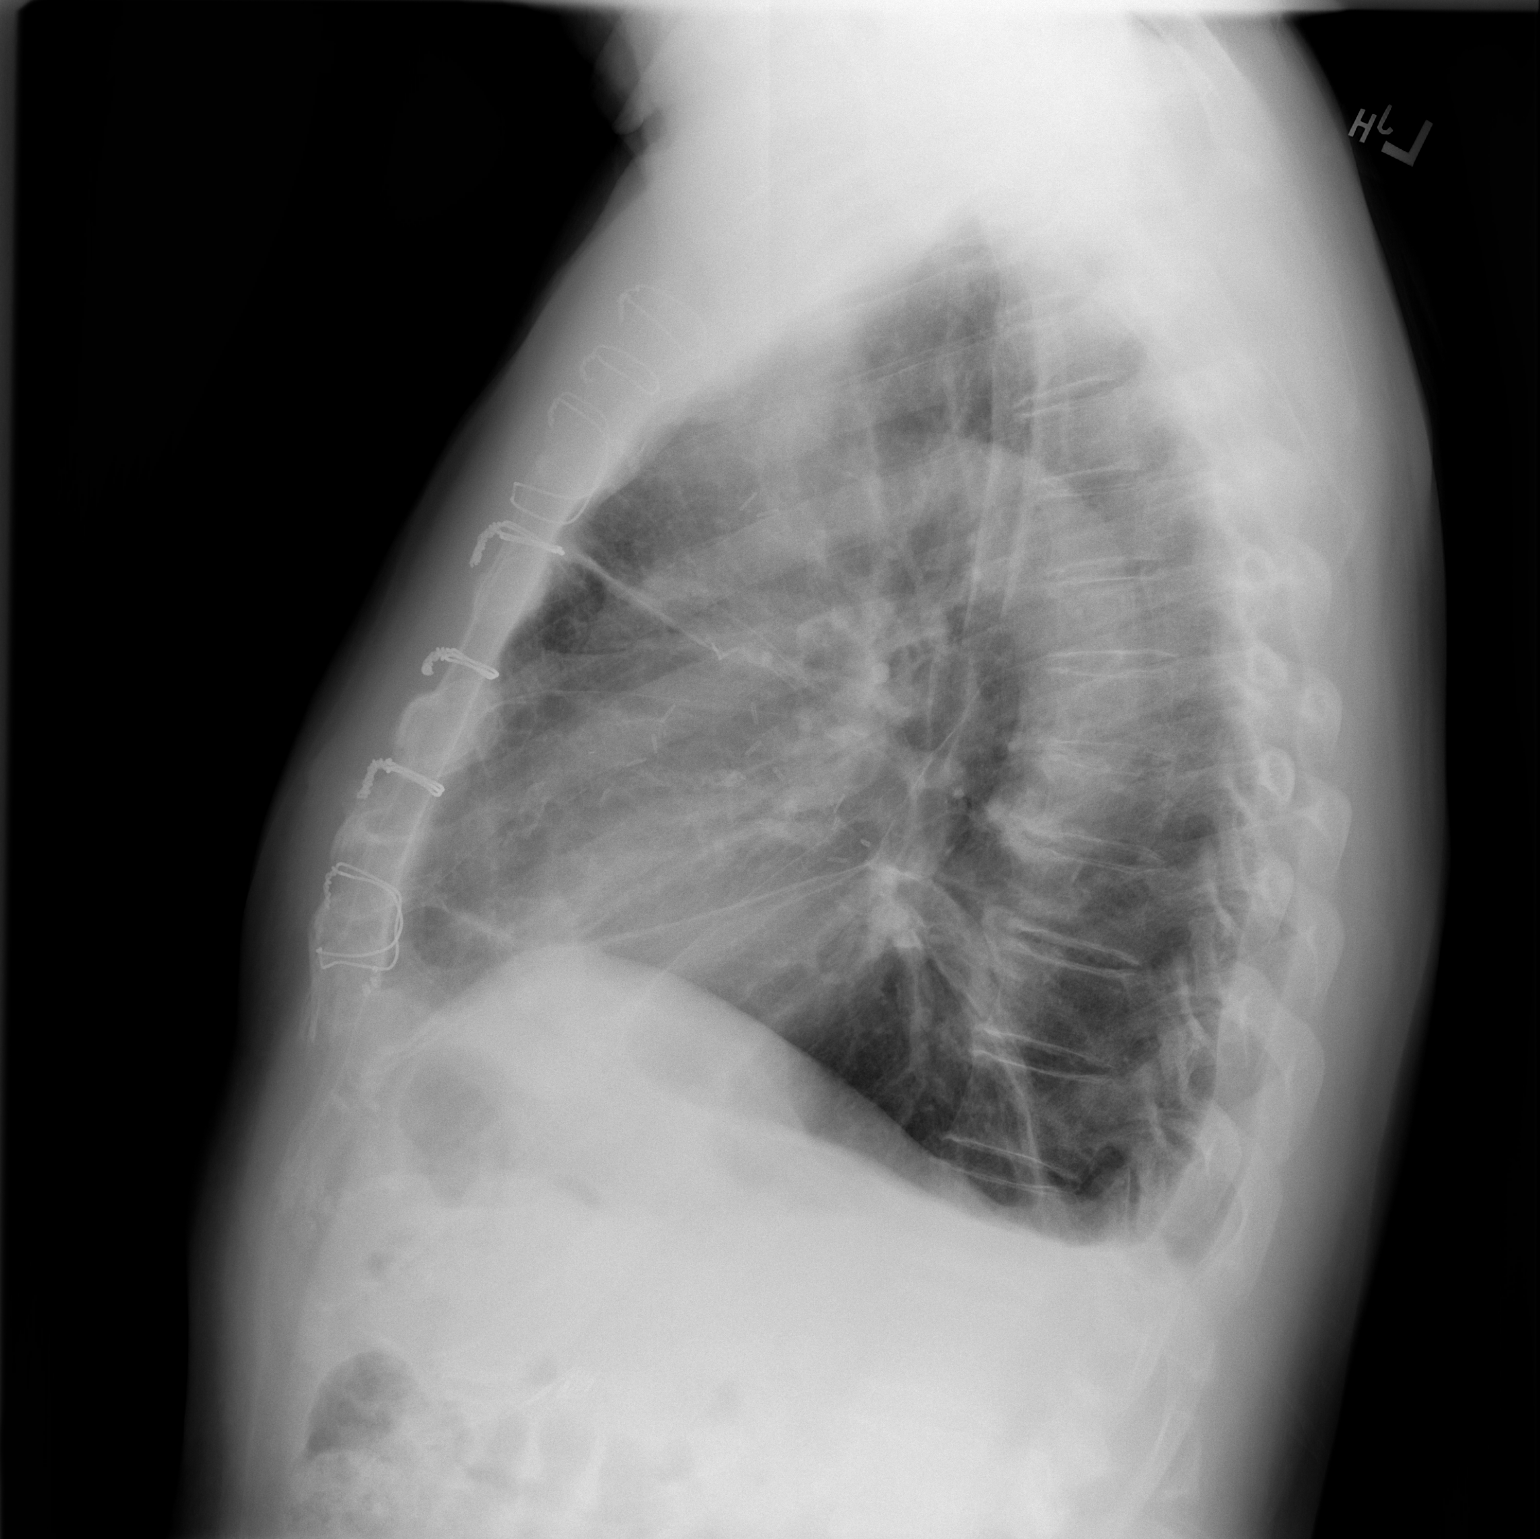

[2 of 2 positions shown; findings below may reference images not displayed]

FINDINGS: Sternotomy wires overlie normal cardiac silhouette. Improved
aeration lung bases compared to prior. Persistent small LEFT
effusion. No pneumothorax.
IMPRESSION: 1. Improved aeration with decreased basilar atelectasis.
2. Persistent LEFT effusion.

## 2020-09-01 DIAGNOSIS — E291 Testicular hypofunction: Secondary | ICD-10-CM | POA: Diagnosis not present

## 2020-09-14 DIAGNOSIS — T466X5A Adverse effect of antihyperlipidemic and antiarteriosclerotic drugs, initial encounter: Secondary | ICD-10-CM | POA: Diagnosis not present

## 2020-09-14 DIAGNOSIS — E039 Hypothyroidism, unspecified: Secondary | ICD-10-CM | POA: Diagnosis not present

## 2020-09-14 DIAGNOSIS — Z125 Encounter for screening for malignant neoplasm of prostate: Secondary | ICD-10-CM | POA: Diagnosis not present

## 2020-09-14 DIAGNOSIS — E1169 Type 2 diabetes mellitus with other specified complication: Secondary | ICD-10-CM | POA: Diagnosis not present

## 2020-09-14 DIAGNOSIS — E291 Testicular hypofunction: Secondary | ICD-10-CM | POA: Diagnosis not present

## 2020-09-14 DIAGNOSIS — G720 Drug-induced myopathy: Secondary | ICD-10-CM | POA: Diagnosis not present

## 2020-09-14 DIAGNOSIS — I2510 Atherosclerotic heart disease of native coronary artery without angina pectoris: Secondary | ICD-10-CM | POA: Diagnosis not present

## 2020-09-14 DIAGNOSIS — E785 Hyperlipidemia, unspecified: Secondary | ICD-10-CM | POA: Diagnosis not present

## 2020-09-29 DIAGNOSIS — E291 Testicular hypofunction: Secondary | ICD-10-CM | POA: Diagnosis not present

## 2020-10-27 DIAGNOSIS — E291 Testicular hypofunction: Secondary | ICD-10-CM | POA: Diagnosis not present

## 2020-11-24 DIAGNOSIS — E291 Testicular hypofunction: Secondary | ICD-10-CM | POA: Diagnosis not present

## 2020-12-15 DIAGNOSIS — E291 Testicular hypofunction: Secondary | ICD-10-CM | POA: Diagnosis not present

## 2020-12-15 DIAGNOSIS — T466X5A Adverse effect of antihyperlipidemic and antiarteriosclerotic drugs, initial encounter: Secondary | ICD-10-CM | POA: Diagnosis not present

## 2020-12-15 DIAGNOSIS — I2510 Atherosclerotic heart disease of native coronary artery without angina pectoris: Secondary | ICD-10-CM | POA: Diagnosis not present

## 2020-12-15 DIAGNOSIS — E785 Hyperlipidemia, unspecified: Secondary | ICD-10-CM | POA: Diagnosis not present

## 2020-12-15 DIAGNOSIS — G720 Drug-induced myopathy: Secondary | ICD-10-CM | POA: Diagnosis not present

## 2020-12-15 DIAGNOSIS — E039 Hypothyroidism, unspecified: Secondary | ICD-10-CM | POA: Diagnosis not present

## 2020-12-15 DIAGNOSIS — E1169 Type 2 diabetes mellitus with other specified complication: Secondary | ICD-10-CM | POA: Diagnosis not present

## 2020-12-22 DIAGNOSIS — E291 Testicular hypofunction: Secondary | ICD-10-CM | POA: Diagnosis not present

## 2021-01-17 DIAGNOSIS — E291 Testicular hypofunction: Secondary | ICD-10-CM | POA: Diagnosis not present

## 2021-02-14 DIAGNOSIS — E291 Testicular hypofunction: Secondary | ICD-10-CM | POA: Diagnosis not present

## 2021-03-14 DIAGNOSIS — E291 Testicular hypofunction: Secondary | ICD-10-CM | POA: Diagnosis not present

## 2021-03-17 DIAGNOSIS — E785 Hyperlipidemia, unspecified: Secondary | ICD-10-CM | POA: Diagnosis not present

## 2021-03-17 DIAGNOSIS — I2510 Atherosclerotic heart disease of native coronary artery without angina pectoris: Secondary | ICD-10-CM | POA: Diagnosis not present

## 2021-03-17 DIAGNOSIS — G720 Drug-induced myopathy: Secondary | ICD-10-CM | POA: Diagnosis not present

## 2021-03-17 DIAGNOSIS — T466X5A Adverse effect of antihyperlipidemic and antiarteriosclerotic drugs, initial encounter: Secondary | ICD-10-CM | POA: Diagnosis not present

## 2021-03-17 DIAGNOSIS — E1169 Type 2 diabetes mellitus with other specified complication: Secondary | ICD-10-CM | POA: Diagnosis not present

## 2021-03-17 DIAGNOSIS — Z1331 Encounter for screening for depression: Secondary | ICD-10-CM | POA: Diagnosis not present

## 2021-03-17 DIAGNOSIS — E291 Testicular hypofunction: Secondary | ICD-10-CM | POA: Diagnosis not present

## 2021-03-17 DIAGNOSIS — E039 Hypothyroidism, unspecified: Secondary | ICD-10-CM | POA: Diagnosis not present

## 2021-04-11 DIAGNOSIS — E291 Testicular hypofunction: Secondary | ICD-10-CM | POA: Diagnosis not present

## 2021-05-09 DIAGNOSIS — E291 Testicular hypofunction: Secondary | ICD-10-CM | POA: Diagnosis not present

## 2021-06-06 DIAGNOSIS — E291 Testicular hypofunction: Secondary | ICD-10-CM | POA: Diagnosis not present

## 2021-06-15 DIAGNOSIS — E785 Hyperlipidemia, unspecified: Secondary | ICD-10-CM | POA: Diagnosis not present

## 2021-06-15 DIAGNOSIS — G729 Myopathy, unspecified: Secondary | ICD-10-CM | POA: Diagnosis not present

## 2021-06-15 DIAGNOSIS — E291 Testicular hypofunction: Secondary | ICD-10-CM | POA: Diagnosis not present

## 2021-06-15 DIAGNOSIS — E039 Hypothyroidism, unspecified: Secondary | ICD-10-CM | POA: Diagnosis not present

## 2021-06-15 DIAGNOSIS — Z125 Encounter for screening for malignant neoplasm of prostate: Secondary | ICD-10-CM | POA: Diagnosis not present

## 2021-06-15 DIAGNOSIS — E1169 Type 2 diabetes mellitus with other specified complication: Secondary | ICD-10-CM | POA: Diagnosis not present

## 2021-06-15 DIAGNOSIS — I2510 Atherosclerotic heart disease of native coronary artery without angina pectoris: Secondary | ICD-10-CM | POA: Diagnosis not present

## 2021-07-04 DIAGNOSIS — E291 Testicular hypofunction: Secondary | ICD-10-CM | POA: Diagnosis not present

## 2021-08-01 DIAGNOSIS — E291 Testicular hypofunction: Secondary | ICD-10-CM | POA: Diagnosis not present

## 2021-08-28 DIAGNOSIS — E291 Testicular hypofunction: Secondary | ICD-10-CM | POA: Diagnosis not present

## 2021-09-18 DIAGNOSIS — I2510 Atherosclerotic heart disease of native coronary artery without angina pectoris: Secondary | ICD-10-CM | POA: Diagnosis not present

## 2021-09-18 DIAGNOSIS — E039 Hypothyroidism, unspecified: Secondary | ICD-10-CM | POA: Diagnosis not present

## 2021-09-18 DIAGNOSIS — E785 Hyperlipidemia, unspecified: Secondary | ICD-10-CM | POA: Diagnosis not present

## 2021-09-18 DIAGNOSIS — E1169 Type 2 diabetes mellitus with other specified complication: Secondary | ICD-10-CM | POA: Diagnosis not present

## 2021-09-18 DIAGNOSIS — E291 Testicular hypofunction: Secondary | ICD-10-CM | POA: Diagnosis not present

## 2021-09-18 DIAGNOSIS — G729 Myopathy, unspecified: Secondary | ICD-10-CM | POA: Diagnosis not present

## 2021-09-26 DIAGNOSIS — E291 Testicular hypofunction: Secondary | ICD-10-CM | POA: Diagnosis not present

## 2021-10-25 DIAGNOSIS — E291 Testicular hypofunction: Secondary | ICD-10-CM | POA: Diagnosis not present

## 2021-11-23 DIAGNOSIS — E291 Testicular hypofunction: Secondary | ICD-10-CM | POA: Diagnosis not present

## 2021-12-19 DIAGNOSIS — E1169 Type 2 diabetes mellitus with other specified complication: Secondary | ICD-10-CM | POA: Diagnosis not present

## 2021-12-19 DIAGNOSIS — E785 Hyperlipidemia, unspecified: Secondary | ICD-10-CM | POA: Diagnosis not present

## 2021-12-19 DIAGNOSIS — I2510 Atherosclerotic heart disease of native coronary artery without angina pectoris: Secondary | ICD-10-CM | POA: Diagnosis not present

## 2021-12-19 DIAGNOSIS — G729 Myopathy, unspecified: Secondary | ICD-10-CM | POA: Diagnosis not present

## 2021-12-19 DIAGNOSIS — E291 Testicular hypofunction: Secondary | ICD-10-CM | POA: Diagnosis not present

## 2021-12-19 DIAGNOSIS — N529 Male erectile dysfunction, unspecified: Secondary | ICD-10-CM | POA: Diagnosis not present

## 2021-12-19 DIAGNOSIS — E039 Hypothyroidism, unspecified: Secondary | ICD-10-CM | POA: Diagnosis not present

## 2021-12-19 DIAGNOSIS — Z125 Encounter for screening for malignant neoplasm of prostate: Secondary | ICD-10-CM | POA: Diagnosis not present

## 2022-01-17 DIAGNOSIS — E291 Testicular hypofunction: Secondary | ICD-10-CM | POA: Diagnosis not present

## 2022-02-13 DIAGNOSIS — E291 Testicular hypofunction: Secondary | ICD-10-CM | POA: Diagnosis not present

## 2022-03-13 DIAGNOSIS — E291 Testicular hypofunction: Secondary | ICD-10-CM | POA: Diagnosis not present

## 2022-03-21 DIAGNOSIS — Z9181 History of falling: Secondary | ICD-10-CM | POA: Diagnosis not present

## 2022-03-21 DIAGNOSIS — Z139 Encounter for screening, unspecified: Secondary | ICD-10-CM | POA: Diagnosis not present

## 2022-03-21 DIAGNOSIS — Z1331 Encounter for screening for depression: Secondary | ICD-10-CM | POA: Diagnosis not present

## 2022-03-21 DIAGNOSIS — I2510 Atherosclerotic heart disease of native coronary artery without angina pectoris: Secondary | ICD-10-CM | POA: Diagnosis not present

## 2022-03-21 DIAGNOSIS — G729 Myopathy, unspecified: Secondary | ICD-10-CM | POA: Diagnosis not present

## 2022-03-21 DIAGNOSIS — E039 Hypothyroidism, unspecified: Secondary | ICD-10-CM | POA: Diagnosis not present

## 2022-03-21 DIAGNOSIS — N529 Male erectile dysfunction, unspecified: Secondary | ICD-10-CM | POA: Diagnosis not present

## 2022-03-21 DIAGNOSIS — E291 Testicular hypofunction: Secondary | ICD-10-CM | POA: Diagnosis not present

## 2022-03-21 DIAGNOSIS — E785 Hyperlipidemia, unspecified: Secondary | ICD-10-CM | POA: Diagnosis not present

## 2022-03-21 DIAGNOSIS — E1169 Type 2 diabetes mellitus with other specified complication: Secondary | ICD-10-CM | POA: Diagnosis not present

## 2022-04-10 DIAGNOSIS — E291 Testicular hypofunction: Secondary | ICD-10-CM | POA: Diagnosis not present

## 2022-05-08 DIAGNOSIS — E291 Testicular hypofunction: Secondary | ICD-10-CM | POA: Diagnosis not present

## 2022-06-05 DIAGNOSIS — E291 Testicular hypofunction: Secondary | ICD-10-CM | POA: Diagnosis not present

## 2022-06-20 DIAGNOSIS — E039 Hypothyroidism, unspecified: Secondary | ICD-10-CM | POA: Diagnosis not present

## 2022-06-20 DIAGNOSIS — N529 Male erectile dysfunction, unspecified: Secondary | ICD-10-CM | POA: Diagnosis not present

## 2022-06-20 DIAGNOSIS — E1169 Type 2 diabetes mellitus with other specified complication: Secondary | ICD-10-CM | POA: Diagnosis not present

## 2022-06-20 DIAGNOSIS — Z125 Encounter for screening for malignant neoplasm of prostate: Secondary | ICD-10-CM | POA: Diagnosis not present

## 2022-06-20 DIAGNOSIS — I2510 Atherosclerotic heart disease of native coronary artery without angina pectoris: Secondary | ICD-10-CM | POA: Diagnosis not present

## 2022-06-20 DIAGNOSIS — E291 Testicular hypofunction: Secondary | ICD-10-CM | POA: Diagnosis not present

## 2022-06-20 DIAGNOSIS — E785 Hyperlipidemia, unspecified: Secondary | ICD-10-CM | POA: Diagnosis not present

## 2022-06-20 DIAGNOSIS — G729 Myopathy, unspecified: Secondary | ICD-10-CM | POA: Diagnosis not present

## 2022-07-03 DIAGNOSIS — E291 Testicular hypofunction: Secondary | ICD-10-CM | POA: Diagnosis not present

## 2022-08-01 DIAGNOSIS — E291 Testicular hypofunction: Secondary | ICD-10-CM | POA: Diagnosis not present

## 2022-08-29 DIAGNOSIS — E291 Testicular hypofunction: Secondary | ICD-10-CM | POA: Diagnosis not present

## 2022-09-19 DIAGNOSIS — I2510 Atherosclerotic heart disease of native coronary artery without angina pectoris: Secondary | ICD-10-CM | POA: Diagnosis not present

## 2022-09-19 DIAGNOSIS — E291 Testicular hypofunction: Secondary | ICD-10-CM | POA: Diagnosis not present

## 2022-09-19 DIAGNOSIS — E1169 Type 2 diabetes mellitus with other specified complication: Secondary | ICD-10-CM | POA: Diagnosis not present

## 2022-09-19 DIAGNOSIS — E039 Hypothyroidism, unspecified: Secondary | ICD-10-CM | POA: Diagnosis not present

## 2022-09-19 DIAGNOSIS — G729 Myopathy, unspecified: Secondary | ICD-10-CM | POA: Diagnosis not present

## 2022-09-19 DIAGNOSIS — E785 Hyperlipidemia, unspecified: Secondary | ICD-10-CM | POA: Diagnosis not present

## 2022-09-19 DIAGNOSIS — N529 Male erectile dysfunction, unspecified: Secondary | ICD-10-CM | POA: Diagnosis not present

## 2022-09-26 DIAGNOSIS — E291 Testicular hypofunction: Secondary | ICD-10-CM | POA: Diagnosis not present

## 2022-10-24 DIAGNOSIS — E291 Testicular hypofunction: Secondary | ICD-10-CM | POA: Diagnosis not present

## 2022-11-21 DIAGNOSIS — E291 Testicular hypofunction: Secondary | ICD-10-CM | POA: Diagnosis not present

## 2022-12-19 DIAGNOSIS — E291 Testicular hypofunction: Secondary | ICD-10-CM | POA: Diagnosis not present

## 2022-12-26 DIAGNOSIS — Z125 Encounter for screening for malignant neoplasm of prostate: Secondary | ICD-10-CM | POA: Diagnosis not present

## 2022-12-26 DIAGNOSIS — E291 Testicular hypofunction: Secondary | ICD-10-CM | POA: Diagnosis not present

## 2022-12-26 DIAGNOSIS — E1169 Type 2 diabetes mellitus with other specified complication: Secondary | ICD-10-CM | POA: Diagnosis not present

## 2022-12-26 DIAGNOSIS — G720 Drug-induced myopathy: Secondary | ICD-10-CM | POA: Diagnosis not present

## 2022-12-26 DIAGNOSIS — E039 Hypothyroidism, unspecified: Secondary | ICD-10-CM | POA: Diagnosis not present

## 2022-12-26 DIAGNOSIS — E785 Hyperlipidemia, unspecified: Secondary | ICD-10-CM | POA: Diagnosis not present

## 2022-12-26 DIAGNOSIS — I2510 Atherosclerotic heart disease of native coronary artery without angina pectoris: Secondary | ICD-10-CM | POA: Diagnosis not present

## 2023-01-16 DIAGNOSIS — E291 Testicular hypofunction: Secondary | ICD-10-CM | POA: Diagnosis not present

## 2023-02-13 DIAGNOSIS — E291 Testicular hypofunction: Secondary | ICD-10-CM | POA: Diagnosis not present

## 2023-03-13 DIAGNOSIS — E291 Testicular hypofunction: Secondary | ICD-10-CM | POA: Diagnosis not present

## 2023-04-02 DIAGNOSIS — Z0000 Encounter for general adult medical examination without abnormal findings: Secondary | ICD-10-CM | POA: Diagnosis not present

## 2023-04-02 DIAGNOSIS — Z9181 History of falling: Secondary | ICD-10-CM | POA: Diagnosis not present

## 2023-04-10 DIAGNOSIS — E1169 Type 2 diabetes mellitus with other specified complication: Secondary | ICD-10-CM | POA: Diagnosis not present

## 2023-04-10 DIAGNOSIS — G720 Drug-induced myopathy: Secondary | ICD-10-CM | POA: Diagnosis not present

## 2023-04-10 DIAGNOSIS — T466X5A Adverse effect of antihyperlipidemic and antiarteriosclerotic drugs, initial encounter: Secondary | ICD-10-CM | POA: Diagnosis not present

## 2023-04-10 DIAGNOSIS — E039 Hypothyroidism, unspecified: Secondary | ICD-10-CM | POA: Diagnosis not present

## 2023-04-10 DIAGNOSIS — E291 Testicular hypofunction: Secondary | ICD-10-CM | POA: Diagnosis not present

## 2023-04-10 DIAGNOSIS — E785 Hyperlipidemia, unspecified: Secondary | ICD-10-CM | POA: Diagnosis not present

## 2023-04-10 DIAGNOSIS — N529 Male erectile dysfunction, unspecified: Secondary | ICD-10-CM | POA: Diagnosis not present

## 2023-04-10 DIAGNOSIS — I2510 Atherosclerotic heart disease of native coronary artery without angina pectoris: Secondary | ICD-10-CM | POA: Diagnosis not present

## 2023-05-08 DIAGNOSIS — E291 Testicular hypofunction: Secondary | ICD-10-CM | POA: Diagnosis not present

## 2023-05-30 DIAGNOSIS — L03116 Cellulitis of left lower limb: Secondary | ICD-10-CM | POA: Diagnosis not present

## 2023-05-30 DIAGNOSIS — M79672 Pain in left foot: Secondary | ICD-10-CM | POA: Diagnosis not present

## 2023-06-05 DIAGNOSIS — E291 Testicular hypofunction: Secondary | ICD-10-CM | POA: Diagnosis not present

## 2023-07-03 DIAGNOSIS — E291 Testicular hypofunction: Secondary | ICD-10-CM | POA: Diagnosis not present

## 2023-07-10 DIAGNOSIS — E291 Testicular hypofunction: Secondary | ICD-10-CM | POA: Diagnosis not present

## 2023-07-10 DIAGNOSIS — E039 Hypothyroidism, unspecified: Secondary | ICD-10-CM | POA: Diagnosis not present

## 2023-07-10 DIAGNOSIS — G720 Drug-induced myopathy: Secondary | ICD-10-CM | POA: Diagnosis not present

## 2023-07-10 DIAGNOSIS — T466X5A Adverse effect of antihyperlipidemic and antiarteriosclerotic drugs, initial encounter: Secondary | ICD-10-CM | POA: Diagnosis not present

## 2023-07-10 DIAGNOSIS — M25572 Pain in left ankle and joints of left foot: Secondary | ICD-10-CM | POA: Diagnosis not present

## 2023-07-10 DIAGNOSIS — Z125 Encounter for screening for malignant neoplasm of prostate: Secondary | ICD-10-CM | POA: Diagnosis not present

## 2023-07-10 DIAGNOSIS — I2510 Atherosclerotic heart disease of native coronary artery without angina pectoris: Secondary | ICD-10-CM | POA: Diagnosis not present

## 2023-07-10 DIAGNOSIS — E785 Hyperlipidemia, unspecified: Secondary | ICD-10-CM | POA: Diagnosis not present

## 2023-07-10 DIAGNOSIS — E1169 Type 2 diabetes mellitus with other specified complication: Secondary | ICD-10-CM | POA: Diagnosis not present

## 2023-10-10 DIAGNOSIS — E039 Hypothyroidism, unspecified: Secondary | ICD-10-CM | POA: Diagnosis not present

## 2023-10-10 DIAGNOSIS — E1169 Type 2 diabetes mellitus with other specified complication: Secondary | ICD-10-CM | POA: Diagnosis not present

## 2023-10-10 DIAGNOSIS — I2510 Atherosclerotic heart disease of native coronary artery without angina pectoris: Secondary | ICD-10-CM | POA: Diagnosis not present

## 2023-10-10 DIAGNOSIS — E785 Hyperlipidemia, unspecified: Secondary | ICD-10-CM | POA: Diagnosis not present

## 2023-10-10 DIAGNOSIS — M25572 Pain in left ankle and joints of left foot: Secondary | ICD-10-CM | POA: Diagnosis not present

## 2023-10-10 DIAGNOSIS — G720 Drug-induced myopathy: Secondary | ICD-10-CM | POA: Diagnosis not present

## 2023-10-10 DIAGNOSIS — E291 Testicular hypofunction: Secondary | ICD-10-CM | POA: Diagnosis not present

## 2023-10-10 DIAGNOSIS — T466X5A Adverse effect of antihyperlipidemic and antiarteriosclerotic drugs, initial encounter: Secondary | ICD-10-CM | POA: Diagnosis not present

## 2023-10-25 ENCOUNTER — Ambulatory Visit (INDEPENDENT_AMBULATORY_CARE_PROVIDER_SITE_OTHER)

## 2023-10-25 ENCOUNTER — Ambulatory Visit (INDEPENDENT_AMBULATORY_CARE_PROVIDER_SITE_OTHER): Admitting: Podiatry

## 2023-10-25 DIAGNOSIS — M7752 Other enthesopathy of left foot: Secondary | ICD-10-CM

## 2023-10-25 DIAGNOSIS — M25572 Pain in left ankle and joints of left foot: Secondary | ICD-10-CM

## 2023-10-25 NOTE — Progress Notes (Signed)
 Chief Complaint  Patient presents with   Ankle Pain    Right ankle pain X 8 months. He has been icing, elevating, heat. Like to walk a couple mile 3 times a week and currently can not walk that much due to the pain.  Not diabetic  Plavix  and ASA   HPI: 73 y.o. male presents today for concern of pain in the left ankle.  Denies injury.  States that it was bruised along the medial ankle 8 months ago.  His doctor had placed him on antibiotics because it was red, hot and swollen.  They were suspicious of gout as well, but his typical naproxen and homeopathic therapies were not effective.  He states that the oral antibiotic did not provide any improvement.  He states that he walks daily for exercise and has not been able to continue on his walks.  Past Medical History:  Diagnosis Date   Allergic rhinitis, cause unspecified    Arthritis    probably some form in my joints (03/17/2013)   Asthma    a little (03/17/2013)   BMI 33.0-33.9,adult    CAD (coronary artery disease)    DES 07/2004 /   DES LAD 09/2004  /  cath 08/2005 stents patent  /  nuclear 02/2008 no ischemia   Chronic allergic rhinitis    Chronic bronchitis ([facility])    used to get it q yr; haven't had it for the last couple years now (03/17/2013)   COPD (chronic obstructive pulmonary disease) ([facility])    a little (03/17/2013)   Dyslipidemia    Ejection fraction    EF 55% cath, 08/2005  /  EF 60% nuclear, 02/2008   Fibromyalgia    GERD (gastroesophageal reflux disease)    hx (03/17/2013)   Gout    Hyperlipidemia    Hypertension    Hypothyroidism    Myocardial infarction ([facility]) 2006   OSA on CPAP    Osteoarthrosis, unspecified whether generalized or localized, unspecified site    Other abnormal blood chemistry    Overweight(278.02)    Rotator cuff syndrome, right    Stroke ([facility])    Type II diabetes mellitus ([facility])    Past Surgical History:  Procedure Laterality Date   arthroscopic shoulder surgery Left    rotator cuff repair  with ligament reattachment   CARDIAC CATHETERIZATION  ?2007   CHOLECYSTECTOMY N/A 03/18/2013   Procedure: LAPAROSCOPIC CHOLECYSTECTOMY;  Surgeon: Vicenta DELENA Poli, MD;  [facility];  Service: General;  Laterality: N/A;   CORONARY ANGIOPLASTY WITH STENT PLACEMENT  2006 X2   1 + 1 (03/17/2013)   CORONARY ARTERY BYPASS GRAFT N/A 09/30/2019   Procedure: CORONARY ARTERY BYPASS GRAFTING (CABG) x 5 ON PUMP. LIMA TO LAD, RIMA TO OM1, RADIAL ARTERY SEQUENCED TO PDA AND OTHER, SVG TO DIAG 1;  Surgeon: German Bartlett PEDLAR, MD;  [facility];  Service: Open Heart Surgery;  Laterality: N/A;  BILATERAL IMA   ENDOVEIN HARVEST OF GREATER SAPHENOUS VEIN Right 09/30/2019   Procedure: ENDOVEIN HARVEST OF GREATER SAPHENOUS VEIN;  Surgeon: German Bartlett PEDLAR, MD;  [facility];  Service: Open Heart Surgery;  Laterality: Right;   LEFT HEART CATH AND CORONARY ANGIOGRAPHY N/A 09/28/2019   Procedure: LEFT HEART CATH AND CORONARY ANGIOGRAPHY;  Surgeon: Verlin Lonni BIRCH, MD;  [facility];  Service: Cardiovascular;  Laterality: N/A;   RADIAL ARTERY HARVEST Left 09/30/2019   Procedure: RADIAL ARTERY HARVEST;  Surgeon: German Bartlett PEDLAR, MD;  [facility];  Service: Open Heart Surgery;  Laterality: Left;   SHOULDER OPEN ROTATOR CUFF REPAIR Left ~ 2008   TEE WITHOUT CARDIOVERSION N/A 09/30/2019   Procedure: TRANSESOPHAGEAL ECHOCARDIOGRAM (TEE);  Surgeon: German Bartlett PEDLAR, MD;  Location: [facility];  Service: Open Heart Surgery;  Laterality: N/A;   Allergies  Allergen Reactions   Ibuprofen     REACTION: intol to large amounts of ibuprofen   Review of Systems  Musculoskeletal:  Positive for joint pain.      Physical Exam: Palpable pedal pulses.  No edema appreciated at the left ankle.  There is pain on palpation to the anterolateral aspect of the left ankle gutter.  No pain on palpation to the peroneal tendons or the sinus tarsi joint.  Ankle range of motion is without crepitus but there is some  pain.  Manual muscle testing 5/5.  Epicritic sensation intact.  Radiographic Exam (left ankle, 3 weightbearing views, 10/25/2023):  Normal osseous mineralization. No fractures noted.  Ankle mortise is intact.  Assessment/Plan of Care: 1. Pain in left ankle and joints of left foot   2. Capsulitis of left ankle     Reviewed clinical and radiographic findings with the patient today.  With the patient's verbal consent, and following a sterile skin prep, a corticosteroid injection was adminstered to the anterolateral aspect of the left ankle joint.  This consisted of a mixture of 1% lidocaine  plain, 0.5% sensorcaine  plain, and Kenalog-10 for a total of 1.25cc's administered.  Bandaid applied. Patient tolerated this well.    Patient prefers not to take any prescription anti-inflammatory medication since they have not worked for him previously.  He will continue with his homeopathic medications at home.  Follow-up as needed   Awanda CHARM Imperial, DPM, FACFAS [facility]     [address] [place], [place] [address]                Office [phone]  Fax [phone]

## 2024-01-14 DIAGNOSIS — E1169 Type 2 diabetes mellitus with other specified complication: Secondary | ICD-10-CM | POA: Diagnosis not present

## 2024-01-14 DIAGNOSIS — E291 Testicular hypofunction: Secondary | ICD-10-CM | POA: Diagnosis not present

## 2024-01-14 DIAGNOSIS — I2510 Atherosclerotic heart disease of native coronary artery without angina pectoris: Secondary | ICD-10-CM | POA: Diagnosis not present

## 2024-01-14 DIAGNOSIS — E039 Hypothyroidism, unspecified: Secondary | ICD-10-CM | POA: Diagnosis not present

## 2024-01-14 DIAGNOSIS — G720 Drug-induced myopathy: Secondary | ICD-10-CM | POA: Diagnosis not present

## 2024-01-14 DIAGNOSIS — T466X5A Adverse effect of antihyperlipidemic and antiarteriosclerotic drugs, initial encounter: Secondary | ICD-10-CM | POA: Diagnosis not present

## 2024-01-14 DIAGNOSIS — E785 Hyperlipidemia, unspecified: Secondary | ICD-10-CM | POA: Diagnosis not present
